# Patient Record
Sex: Female | Born: 1975 | Race: White | Hispanic: No | Smoking: Former smoker
Health system: Southern US, Community
[De-identification: ages and names within clinical notes are randomized; demographics above are authoritative.]

## PROBLEM LIST (undated history)

## (undated) ENCOUNTER — Emergency Department: Payer: 59 | Source: Home / Self Care

## (undated) ENCOUNTER — Inpatient Hospital Stay (HOSPITAL_COMMUNITY): Payer: Self-pay

## (undated) DIAGNOSIS — M1990 Unspecified osteoarthritis, unspecified site: Secondary | ICD-10-CM

## (undated) DIAGNOSIS — Z8659 Personal history of other mental and behavioral disorders: Secondary | ICD-10-CM

## (undated) DIAGNOSIS — F1910 Other psychoactive substance abuse, uncomplicated: Secondary | ICD-10-CM

## (undated) DIAGNOSIS — O09529 Supervision of elderly multigravida, unspecified trimester: Secondary | ICD-10-CM

## (undated) DIAGNOSIS — F329 Major depressive disorder, single episode, unspecified: Secondary | ICD-10-CM

## (undated) DIAGNOSIS — G8929 Other chronic pain: Secondary | ICD-10-CM

## (undated) DIAGNOSIS — M549 Dorsalgia, unspecified: Secondary | ICD-10-CM

## (undated) DIAGNOSIS — R200 Anesthesia of skin: Secondary | ICD-10-CM

## (undated) DIAGNOSIS — F32A Depression, unspecified: Secondary | ICD-10-CM

## (undated) HISTORY — DX: Supervision of elderly multigravida, unspecified trimester: O09.529

## (undated) HISTORY — DX: Personal history of other mental and behavioral disorders: Z86.59

## (undated) HISTORY — PX: LUMBAR FUSION: SHX111

## (undated) HISTORY — PX: BUNIONECTOMY: SHX129

## (undated) HISTORY — PX: OTHER SURGICAL HISTORY: SHX169

## (undated) HISTORY — PX: BREAST SURGERY: SHX581

## (undated) HISTORY — PX: CHOLECYSTECTOMY: SHX55

## (undated) HISTORY — PX: BACK SURGERY: SHX140

## (undated) HISTORY — PX: DIAGNOSTIC LAPAROSCOPY: SUR761

---

## 1997-05-05 ENCOUNTER — Other Ambulatory Visit: Admission: RE | Admit: 1997-05-05 | Discharge: 1997-05-05 | Payer: Self-pay | Admitting: Obstetrics and Gynecology

## 1998-06-22 ENCOUNTER — Other Ambulatory Visit: Admission: RE | Admit: 1998-06-22 | Discharge: 1998-06-22 | Payer: Self-pay | Admitting: Obstetrics and Gynecology

## 1999-06-23 ENCOUNTER — Other Ambulatory Visit: Admission: RE | Admit: 1999-06-23 | Discharge: 1999-06-23 | Payer: Self-pay | Admitting: Obstetrics and Gynecology

## 2000-10-17 ENCOUNTER — Other Ambulatory Visit: Admission: RE | Admit: 2000-10-17 | Discharge: 2000-10-17 | Payer: Self-pay | Admitting: Obstetrics and Gynecology

## 2002-01-10 ENCOUNTER — Other Ambulatory Visit: Admission: RE | Admit: 2002-01-10 | Discharge: 2002-01-10 | Payer: Self-pay | Admitting: Obstetrics and Gynecology

## 2003-03-09 ENCOUNTER — Other Ambulatory Visit: Admission: RE | Admit: 2003-03-09 | Discharge: 2003-03-09 | Payer: Self-pay | Admitting: *Deleted

## 2004-04-13 ENCOUNTER — Other Ambulatory Visit: Admission: RE | Admit: 2004-04-13 | Discharge: 2004-04-13 | Payer: Self-pay | Admitting: Obstetrics and Gynecology

## 2004-08-23 ENCOUNTER — Ambulatory Visit: Payer: Self-pay | Admitting: Psychiatry

## 2004-08-23 ENCOUNTER — Other Ambulatory Visit: Admission: RE | Admit: 2004-08-23 | Discharge: 2004-09-15 | Payer: Self-pay | Admitting: Psychiatry

## 2004-09-21 ENCOUNTER — Ambulatory Visit (HOSPITAL_COMMUNITY): Payer: Self-pay | Admitting: Psychiatry

## 2004-12-17 ENCOUNTER — Ambulatory Visit (HOSPITAL_COMMUNITY): Admission: RE | Admit: 2004-12-17 | Discharge: 2004-12-17 | Payer: Self-pay | Admitting: Family Medicine

## 2005-03-18 ENCOUNTER — Emergency Department (HOSPITAL_COMMUNITY): Admission: EM | Admit: 2005-03-18 | Discharge: 2005-03-18 | Payer: Self-pay | Admitting: Emergency Medicine

## 2006-08-05 ENCOUNTER — Inpatient Hospital Stay (HOSPITAL_COMMUNITY): Admission: AD | Admit: 2006-08-05 | Discharge: 2006-08-05 | Payer: Self-pay | Admitting: Obstetrics and Gynecology

## 2006-08-24 ENCOUNTER — Inpatient Hospital Stay (HOSPITAL_COMMUNITY): Admission: AD | Admit: 2006-08-24 | Discharge: 2006-08-27 | Payer: Self-pay | Admitting: Obstetrics and Gynecology

## 2006-08-25 ENCOUNTER — Encounter (INDEPENDENT_AMBULATORY_CARE_PROVIDER_SITE_OTHER): Payer: Self-pay | Admitting: Obstetrics and Gynecology

## 2007-05-20 ENCOUNTER — Inpatient Hospital Stay (HOSPITAL_COMMUNITY): Admission: EM | Admit: 2007-05-20 | Discharge: 2007-05-22 | Payer: Self-pay | Admitting: Emergency Medicine

## 2007-05-20 ENCOUNTER — Encounter (INDEPENDENT_AMBULATORY_CARE_PROVIDER_SITE_OTHER): Payer: Self-pay | Admitting: Surgery

## 2007-11-01 ENCOUNTER — Ambulatory Visit (HOSPITAL_COMMUNITY): Admission: RE | Admit: 2007-11-01 | Discharge: 2007-11-01 | Payer: Self-pay | Admitting: Obstetrics and Gynecology

## 2010-06-21 NOTE — Discharge Summary (Signed)
Laura, Whitehead              ACCOUNT NO.:  [id number]   MEDICAL RECORD NO.:  [id number]          PATIENT TYPE:  INP   LOCATION:  1615                         FACILITY:  [facility]   PHYSICIAN:  Sandria Bales. Ezzard Standing, M.D.  DATE OF BIRTH:  1976/02/07   DATE OF ADMISSION:  05/20/2007  DATE OF DISCHARGE:  05/22/2007                               DISCHARGE SUMMARY   Where are admit dates and discharge dates ???   DISCHARGE DIAGNOSES:  1. Cholecystitis with cholelithiasis (final pathology pending).  2. Right flank pain presumably secondary to gallbladder disease.   PROCEDURE:  Laparoscopic cholecystectomy with intraoperative  cholangiogram on May 20, 2007.   HISTORY OF PRESENT ILLNESS:  [name] is a 35 year old, white female,  patient of Dr. Sigmund Hazel, who presented on May 20, 2007, with 3-day  history of right-sided abdominal pain.  She had an ultrasound and CT  scan at [facility] which showed a thickened gallbladder wall  with gallstones and questionable common bile duct stone.  She has a  strong family history of gallbladder disease.   PAST MEDICAL HISTORY:  Recent oral surgery.  She had been on and off  amoxicillin for about 2 weeks.  She quit smoking about 1 year before.   PHYSICAL EXAMINATION:  VITAL SIGNS:  Temperature is 98.7, pulse 98,  blood pressure 131/100.  GENERAL:  She presented with right upper quadrant abdominal pain.   LABORATORY DATA AND X-RAY FINDINGS:  She had a lipase of 27 on  admission.  She had a white blood count of 21,500, hemoglobin 14.6.  Her  liver function showed an Alk phos of 68 and a bilirubin of 0.8 and  otherwise normal liver functions.   HOSPITAL COURSE:  She was taken to the operating room on the day of  admission where she underwent a laparoscopic cholecystectomy with  intraoperative cholangiogram.  On the cholangiogram, she had a mildly  dilated common bile, but there is no evidence of any common bile duct  stone.   Postoperatively, she did well from the surgery, but on postop day #1,  she was still experiencing a fair amount of flank and right upper  quadrant flank and back pain that seemed a little bit out of proportion  to her gallbladder surgery.  I did give her some Toradol.  We kept her  for another day.  She is now 2 days postop.  She is still sore in the  right upper quadrant, but feeling much better.  Rechecked some labs on  her and white blood count was 11,800.  Her amylase was 51.  Her total  bilirubin was 0.8 and Alk phos 68.   She is now ready for discharge.   DIET:  Low fat diet.   ACTIVITY:  She can shower.  She should not drive Z6-1 days.  She is  clearly comfortable from her surgery.   DISCHARGE MEDICATIONS:  She is given Vicodin for pain and she can also  use ibuprofen.  I am not going to send her home on any antibiotics.   FOLLOW UP:  I do want to see  her back in 2 weeks for followup.  Again,  pathology is pending at the time of this dictation.      Sandria Bales. Ezzard Standing, M.D.  Electronically Signed     DHN/MEDQ  D:  05/22/2007  T:  05/22/2007  Job:  [id number]   cc:   Sigmund Hazel, M.D.  Fax: [phone]

## 2010-06-21 NOTE — Op Note (Signed)
[name], [name]              ACCOUNT NO.:  [id number]   MEDICAL RECORD NO.:  [id number]          PATIENT TYPE:  INP   LOCATION:  0098                         FACILITY:  [facility]   PHYSICIAN:  Sandria Bales. Ezzard Standing, M.D.  DATE OF BIRTH:  [birth year]   DATE OF PROCEDURE:  05/20/2007  DATE OF DISCHARGE:                               OPERATIVE REPORT   Date of surgery???   PREOPERATIVE DIAGNOSES:  Cholecystitis and cholelithiasis.   POSTOPERATIVE DIAGNOSES:  Cholecystitis and cholelithiasis.   PROCEDURE:  Laparoscopic cholecystectomy with intraoperative  cholangiogram.   SURGEON:  Sandria Bales. Ezzard Standing, M.D.   FIRST ASSISTANT:  Maisie Fus A. Cornett, M.D.   ANESTHESIA:  General endotracheal.   BLOOD LOSS:  Minimal.   INDICATION FOR PROCEDURE:  [name] is a 35 year old white female, a  patient of Dr. Sigmund Hazel, who has a 3-day history of right-sided  abdominal pain.  She had an ultrasound and CT scan at [facility] today, which showed a thickened gallbladder wall and  gallstones.  They also raised a question of common bile duct stones.  She also has an elevated white blood count.   I discussed with her about proceeding with a cholecystectomy.  I  discussed with her the indications and potential risks.  The potential  risks include, but are not limited to, bleeding, infection, common duct  stones, common bile duct injury and the possibility of open surgery.   OPERATIVE NOTE:  The patient was placed in a supine position, given a  general endotracheal anesthetic.  She had a gram of Ancef  preoperatively.  Her abdomen was prepped with Betadine solution and  sterilely draped.  A time-out was held, identifying the patient and the  procedure.   I placed 4 trocars; a 12-mm Hasson at the umbilicus, a 10/11 mm trocar  in the subxiphoid location, a 5 mm trocar in the right mid subcostal,  and a 5 mm trocar in the right lateral subcostal location.  Abdominal  exploration revealed  right and left lobes of the liver unremarkable.  Stomach was unremarkable; I visualized the appendix, which was  unremarkable and not inflamed.  The gallbladder was somewhat boggy,  mildly edematous; but not as bad looking as I would imagine by the way  the CT scan looked.  I grabbed the gallbladder and rotated it cephalad.  There was some edema around the gallbladder cystic duct neck.  I  Identified the triangle of Calot.  I identified the cystic duct-  gallbladder junction, and placed a clip on gallbladder side and shot an  intraoperative cholangiogram.   The intraoperative cholangiogram was shot using cutoff taut catheter  through a 14-gauge Jelco.  The 14-gauge Jelco was secured with the Endo  clip into the side of the cut cystic duct.  I used about 8 mL of half-  strength Hypaque solution with contrast; which flowed down the cystic  duct, into the common bile duct and into the duodenum at the hepatic  radicals.  The common bile duct was mildly dilated.  There was no  filling defect.  Contrast flowed promptly into  the small bowel.  There  was no evidence of retained stone.  It was felt to be a normal  intraoperative cholangiogram.   The taut catheter was then removed.  The cystic duct was triply Endo  clipped and divided.  The cystic artery, anterior and posterior branch  were clipped.  There was actually one very large branch, which was made  more anterior -- which was clipped and a smaller posterior branch.  This  was doubly clipped.  The gallbladder was sharply and bluntly dissected  from the gallbladder bed.  Prior to division of the gallbladder from  gallbladder bed. I revisualized the triangle of Calot.  I revisualized  the gallbladder bed; there was no bleeding and no bile leak.  The  gallbladder was then divided, and placed in an EndoCatch bag and  delivered through the umbilicus.   I then closed the umbilical port with a 0 Vicryl suture.  I visualized  the trocar and  removed them at each site.  There was no bleeding at a  trocar site.  The skin at each trocar site was closed with a 5-0 Vicryl  suture.  I infiltrated about 35 mL of 0.25% Marcaine as a local  anesthetic.  I painted each wound with Tincture of Benzoin and Steri-  Strips.   The patient tolerated the procedure well.  She was transported to the  recovery room in good condition.  Sponge and needle counts were correct  at the end of the case.      Sandria Bales. Ezzard Standing, M.D.  Electronically Signed     DHN/MEDQ  D:  05/20/2007  T:  05/20/2007  Job:  [id number]   cc:   Sigmund Hazel, M.D.  Fax: [phone]

## 2010-06-21 NOTE — Op Note (Signed)
[name], [name]              ACCOUNT NO.:  [id number]   MEDICAL RECORD NO.:  [id number]          PATIENT TYPE:  AMB   LOCATION:  SDC                           FACILITY:  [facility]   PHYSICIAN:  Duke Salvia. Marcelle Overlie, M.D.DATE OF BIRTH:  12-14-1975   DATE OF PROCEDURE:  11/01/2007  DATE OF DISCHARGE:                               OPERATIVE REPORT   PREOPERATIVE DIAGNOSIS:  Dyspareunia.   POSTOPERATIVE DIAGNOSIS:  Dyspareunia.   PROCEDURE:  Diagnostic laparoscopy.   SURGEON:  Duke Salvia. Marcelle Overlie, MD   ANESTHESIA:  General endotracheal.   COMPLICATIONS:  None.   DRAINS:  In-and-out Foley catheter.   BLOOD LOSS:  Minimal.   SPECIMENS:  None.   PROCEDURE AND FINDINGS:  The patient was taken to the operating room.  After an adequate level of general endotracheal anesthesia was obtained,  the patient legs in stirrups, the abdomen, perineum, and vagina were  prepped and draped in usual manner for laparoscopy.  The bladder was  drained, EUA carried out.  Uterus normal size and mobile.  Adnexa  negative.  Hulka tenaculum was positioned.  Subumbilical area was  infiltrated with 0.25% Marcaine plain.  A small incision was made.  The  Veress needle introduced without difficulty.  Its intra-abdominal  position was verified by pressure and water testing.  After 2-1/2 liter  pneumoperitoneum was then created, laparoscopic trocar and sleeve were  then introduced without difficulty.  There was no evidence of any  bleeding or trauma.  Three fingerbreadths above the symphysis in  midline, a 5-mm trocar was inserted under direct visualization.  The  patient then placed in Trendelenburg and the pelvic finding as follows.   The uterus itself was normal size, mobile.  Serosa unremarkable.  The  area of the bladder flap was normal.  No evidence of endometriosis.  Adnexa bilaterally unremarkable.  Delicate fimbriated end.  There were  no periadnexal adhesions and no surface endometriosis on the  ovaries.  Cul-de-sac was free and clear.  Careful inspection and magnification of  the uterosacral ligaments could not identify any endometriosis.  Course  of the ureter on either side  was normal.  Appendix appeared to be normal as did liver edge and upper  abdomen.  After these findings were photo documented, the instruments  were removed, gas allowed to escape, defects were closed with 4-0 Dexon,  subcuticular sutures and Dermabond.  She tolerated this well and went to  recovery room in good condition.      Richard M. Marcelle Overlie, M.D.  Electronically Signed     RMH/MEDQ  D:  11/01/2007  T:  11/01/2007  Job:  [id number]

## 2010-06-21 NOTE — H&P (Signed)
NAMEQUENTINA, FRONEK              ACCOUNT NO.:  [id number]   MEDICAL RECORD NO.:  [id number]          PATIENT TYPE:  AMB   LOCATION:                                FACILITY:  [facility]   PHYSICIAN:  Duke Salvia. Marcelle Overlie, M.D.DATE OF BIRTH:  1975-10-12   DATE OF ADMISSION:  11/01/2007  DATE OF DISCHARGE:                              HISTORY & PHYSICAL   CHIEF COMPLAINT:  Pelvic pain/dyspareunia.   HISTORY OF PRESENT ILLNESS:  A 35 year old G2, P1 currently on Depo-  Provera has a 6 to 12 month history of worsening pelvic pain and deep  dyspareunia.  She presents now for diagnostic laparoscopy to further  evaluate.  This procedure including risks related to bleeding,  infection, adjacent organ injury, the possible need for open additional  surgery all reviewed with her which she understands and accepts.  Laparoscopic procedures for lysis of adhesions and treatment of  endometriosis reviewed also.   PAST MEDICAL HISTORY:  Allergies, none.   CURRENT MEDICATIONS:  Depo-Provera.   SURGERY:  Had vaginal delivery July 2008, cholecystectomy April 2009.   FAMILY HISTORY:  Significant for headache otherwise unremarkable.   PHYSICAL EXAM:  VITAL SIGNS:  Temperature 98.2, blood pressure 120/62.  HEENT: Unremarkable.  NECK:  Supple without masses.  LUNGS:  Clear.  CARDIOVASCULAR:  Rate and rhythm without murmurs, rubs or gallops.  BREASTS:  Without masses.  ABDOMEN:  Soft, flat, and nontender.  PELVIC:  Normal external genitalia.  Vagina and cervix clear.  Uterus  mid position, normal size.  Adnexa negative.  No unusual nodularity or  tenderness.  EXTREMITIES:  Unremarkable.  NEUROLOGIC:  Unremarkable.   IMPRESSION:  Deep dyspareunia/pelvic pain.   PLAN:  Diagnostic laparoscopy.  Procedure and risks reviewed as above.      Richard M. Marcelle Overlie, M.D.  Electronically Signed     RMH/MEDQ  D:  10/31/2007  T:  10/31/2007  Job:  [id number]

## 2010-06-21 NOTE — H&P (Signed)
[name], [name]              ACCOUNT NO.:  [id number]   MEDICAL RECORD NO.:  [id number]          PATIENT TYPE:  EMS   LOCATION:  ED                           [facility]ACILITY:  [facility]   PHYSICIAN:  Sandria Bales. Ezzard Standing, M.D.  DATE OF BIRTH:  Jun 07, 1975   DATE OF ADMISSION:  05/20/2007  DATE OF DISCHARGE:                              HISTORY & PHYSICAL   CHIEF COMPLAINT:  Possible gallbladder disease.   REFERRING PHYSICIAN:  Sigmund Hazel, M.D.   HISTORY OF ILLNESS:  This is a 35 year old white female patient of Dr.  Sigmund Hazel who was in otherwise good health until approximately Friday,  May 17, 1998, when she started developing right upper quadrant pain.  She kind of suffered through the weekend.  She saw Dr. Hyacinth Meeker today.  Dr. Hyacinth Meeker obtained a CT scan and ultrasound at [facility] radiology.  The CT scan was done without contrast.  It that showed a gallbladder  wall thickening with tiny gallstones.  An ultrasound showed gallbladder  wall thickening with gallstones and question choledocholithiasis and a  normal-sized common bile duct.   Ms. Geers has multiple family members who have had gallbladder disease.  She denies any history of peptic ulcer disease, liver disease,  hepatitis, pancreatic disease, colon disease.  She has had no prior  abdominal surgery.   PAST MEDICAL HISTORY:   ALLERGIES:  She has no allergies.   MEDICATIONS:  She is on no medication.   REVIEW OF SYSTEMS:  NEUROLOGIC:  No seizure or loss of consciousness.  PULMONARY:  She quit smoking last year.  No lung disease or asthma.  GASTROINTESTINAL:  No history of peptic ulcer disease or liver disease.  See history of present illness.  NEUROLOGIC:  She  had a kidney infection as a child but no problems as  an adult.  GYN:  Her first birth was last summer, and her husband is in the room  with her.   She worked at [employer], however was laid off just 2 weeks ago.   PHYSICAL EXAMINATION:  VITAL SIGNS:  Her temperature  is 98.7, blood  pressure 130/100, pulse is 98.  Her saturations are 99%.  GENERAL:  She is a well-nourished white female, alert and cooperative.  HEENT:  Unremarkable.  NECK:  Supple.  I feel no mass, no thyromegaly.  She has no cervical or  supraclavicular adenopathy.  LUNGS:  Clear to auscultation with symmetric breath sounds.  HEART:  Regular rate and rhythm without murmur or rub.  ABDOMEN:  She has not really tenderness, but some soreness in her right  abdomen, kind of the whole right side.  She really did not have any  guarding.  She has no rebound.  Her bowel sounds are present.  EXTREMITIES:  She has good strength in all 4 extremities.  NEUROLOGIC:  Grossly intact.   LABORATORY DATA:  Labs that I have provided by Dr. Hyacinth Meeker include normal  liver functions with a total bilirubin of 0.7 and alkaline phosphatase  of 71.  Her urinalysis was negative.  Her hemoglobin was 15, hematocrit  42.  Her white blood count  was 16,100.  I have repeated the labs here.   X-RAYS:  I reviewed her films from [facility] with Dr. Ezequiel Ganser and he agrees about gallbladder disease.  Again, she has had  several family members who have had gallbladder surgery, so she is  somewhat familiar with it.   IMPRESSION:  1. Cholecystitis and cholelithiasis:  I talked about laparoscopic and      open gallbladder surgery.  I talked about the risks which include      bleeding, infection, common bile duct stones and common bile duct      injury, and that she may need some further tests such as an ERCP if      she has common bile duct stones and I cannot clear her ducts.      Ialso talked about how her symptoms could be from another cause,      but the gall bladder seems the most likely source of pain.      Sandria Bales. Ezzard Standing, M.D.  Electronically Signed     DHN/MEDQ  D:  05/20/2007  T:  05/20/2007  Job:  [id number]   cc:   Sigmund Hazel, M.D.  Fax: [phone]   Hilario Quarry, M.D.  Fax:  [phone]

## 2010-06-21 NOTE — H&P (Signed)
NAMEABBY, TUCHOLSKI              ACCOUNT NO.:  [id number]   MEDICAL RECORD NO.:  [id number]          PATIENT TYPE:  AMB   LOCATION:  SDC                           FACILITY:  [facility]   PHYSICIAN:  Duke Salvia. Marcelle Overlie, M.D.DATE OF BIRTH:  1975/12/19   DATE OF ADMISSION:  DATE OF DISCHARGE:                              HISTORY & PHYSICAL   Date of scheduled surgery at [facility] is on November 01, 2007.   CHIEF COMPLAINT:  Pelvic pain and dyspareunia.   HISTORY OF PRESENT ILLNESS:  A 35 year old G2, P1, A1 on Depo-Provera  has a 6-12 months' history of worsening pain, deep pain with intercourse  and also pelvic pain at other times.  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  She has been taking higher dose of Motrin 800 mg q.8-12 h for  pain without significant improvement, and presents now for diagnostic  laparoscopy.  This procedure including risks related to bleeding,  infection, adjacent organ injury, and the possible need for open  additional surgery were all reviewed.  Additionally, laparoscopic  treatment, lysis of adhesions or treatment of endometriosis was  explained to her.   PAST MEDICAL HISTORY:   ALLERGIES:  None.   OPERATIONS:  Vaginal delivery and cholecystectomy.   REVIEW OF SYSTEMS:  Significant for a history of substance abuse.  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   MEDICATIONS:  She is taking Depo-Provera now and ibuprofen p.r.n. pain.   SOCIAL HISTORY:  She has quit smoking.  Denies alcohol use.   FAMILY HISTORY:  Significant for heart disease and arthritis.   PHYSICAL EXAMINATION:  VITAL SIGNS:  Temperature 98.2 and blood pressure  120/78.  HEENT:  Unremarkable.  NECK:  Supple without masses.  LUNGS:  Clear.  CARDIOVASCULAR:  Regular rate and rhythm without murmurs, rubs, or  gallops.  BREASTS:  Without masses.  ABDOMEN:  Soft and nontender.  PELVIC:  Normal external genitalia.  Vagina and cervix are clear.  Uterus mid-positional size.  Adnexa negative.  No unusual nodularity or  tenderness.  EXTREMITIES:  Unremarkable.  NEUROLOGIC:  Unremarkable.   IMPRESSION:  1. Dyspareunia.  2. Chronic pelvic pain.   PLAN:  Diagnostic laparoscopy.  Procedure and risks reviewed as above.      Richard M. Marcelle Overlie, M.D.  Electronically Signed     RMH/MEDQ  D:  10/23/2007  T:  10/23/2007  Job:  [id number]

## 2010-11-01 LAB — COMPREHENSIVE METABOLIC PANEL
ALT: 16
AST: 16
Albumin: 4.1
Alkaline Phosphatase: 68
BUN: 7
CO2: 24
Calcium: 9.5
Chloride: 109
Creatinine, Ser: 0.69
GFR calc Af Amer: >60
GFR calc non Af Amer: >60
Glucose, Bld: 99
Potassium: 3.7
Sodium: 140
Total Bilirubin: 0.8
Total Protein: 7.4

## 2010-11-01 LAB — CBC
HCT: 35.9 — ABNORMAL LOW
HCT: 43.2
Hemoglobin: 12.3
Hemoglobin: 14.6
MCHC: 33.8
MCHC: 34.3
MCV: 88.8
MCV: 89.5
Platelets: 232
Platelets: 308
RBC: 4.04
RBC: 4.83
RDW: 12.8
RDW: 13.2
WBC: 11.8 — ABNORMAL HIGH
WBC: 21.5 — ABNORMAL HIGH

## 2010-11-01 LAB — DIFFERENTIAL
Basophils Absolute: 0.2 — ABNORMAL HIGH
Basophils Relative: 1
Eosinophils Absolute: 0.2
Eosinophils Relative: 1
Lymphocytes Relative: 18
Lymphs Abs: 3.9
Monocytes Absolute: 0.8
Monocytes Relative: 4
Neutro Abs: 16.4 — ABNORMAL HIGH
Neutrophils Relative %: 76

## 2010-11-01 LAB — BASIC METABOLIC PANEL
BUN: 5 — ABNORMAL LOW
CO2: 26
Calcium: 8.9
Chloride: 108
Creatinine, Ser: 0.65
GFR calc Af Amer: >60
GFR calc non Af Amer: >60
Glucose, Bld: 106 — ABNORMAL HIGH
Potassium: 3.6
Sodium: 141

## 2010-11-01 LAB — AMYLASE: Amylase: 51

## 2010-11-01 LAB — LIPASE, BLOOD: Lipase: 27

## 2010-11-07 LAB — PREGNANCY, URINE: Preg Test, Ur: NEGATIVE

## 2010-11-07 LAB — CBC
HCT: 40.7
Hemoglobin: 13.8
MCHC: 33.9
MCV: 89.3
Platelets: 304
RBC: 4.56
RDW: 14.1
WBC: 12 — ABNORMAL HIGH

## 2010-11-21 LAB — CBC
HCT: 29 — ABNORMAL LOW
HCT: 39.1
Hemoglobin: 10 — ABNORMAL LOW
Hemoglobin: 13.5
MCHC: 34.5
MCHC: 34.6
MCV: 88.7
MCV: 89.7
Platelets: 243
Platelets: 267
RBC: 3.23 — ABNORMAL LOW
RBC: 4.41
RDW: 13.6
RDW: 13.7
WBC: 19.2 — ABNORMAL HIGH
WBC: 26.1 — ABNORMAL HIGH

## 2010-11-21 LAB — RPR: RPR Ser Ql: NONREACTIVE

## 2011-04-07 ENCOUNTER — Emergency Department (HOSPITAL_COMMUNITY)
Admission: EM | Admit: 2011-04-07 | Discharge: 2011-04-08 | Disposition: A | Discharge status: Home / Self Care | Payer: BC Managed Care – PPO | Attending: Emergency Medicine | Admitting: Emergency Medicine

## 2011-04-07 DIAGNOSIS — F1910 Other psychoactive substance abuse, uncomplicated: Secondary | ICD-10-CM | POA: Insufficient documentation

## 2011-04-07 DIAGNOSIS — IMO0002 Reserved for concepts with insufficient information to code with codable children: Secondary | ICD-10-CM | POA: Insufficient documentation

## 2011-04-07 DIAGNOSIS — F29 Unspecified psychosis not due to a substance or known physiological condition: Secondary | ICD-10-CM

## 2011-04-07 HISTORY — DX: Other psychoactive substance abuse, uncomplicated: F19.10

## 2011-04-07 NOTE — ED Notes (Signed)
Pt brought in by EMS with c/o hallucinations  Pt thinks there are bugs falling off the ceiling landing on her and that they are crawling under her skin  Pt is crying in room and husband is at bedside  Pt has an addiction and has been taking suboxone  Pt states she has taken one vicodin today and a half of the suboxone today  Pt was uncooperative with EMS and refused any treatment from them

## 2011-04-07 NOTE — ED Notes (Signed)
[id number]<YN> Expected date:<BR> Expected time:11:46 PM<BR> Means of arrival:<BR> Comments:<BR> M10 - 34yoF hallucinating

## 2011-04-08 ENCOUNTER — Encounter (HOSPITAL_COMMUNITY): Payer: Self-pay | Admitting: Emergency Medicine

## 2011-04-08 LAB — CBC
HCT: 36.9 % (ref 36.0–46.0)
Hemoglobin: 12.8 g/dL (ref 12.0–15.0)
MCH: 29.3 pg (ref 26.0–34.0)
MCHC: 34.7 g/dL (ref 30.0–36.0)
MCV: 84.4 fL (ref 78.0–100.0)
Platelets: 345 10*3/uL (ref 150–400)
RBC: 4.37 MIL/uL (ref 3.87–5.11)
RDW: 13.4 % (ref 11.5–15.5)
WBC: 18.7 10*3/uL — ABNORMAL HIGH (ref 4.0–10.5)

## 2011-04-08 LAB — BASIC METABOLIC PANEL
BUN: 8 mg/dL (ref 6–23)
CO2: 21 mEq/L (ref 19–32)
Calcium: 9.3 mg/dL (ref 8.4–10.5)
Chloride: 102 mEq/L (ref 96–112)
GFR calc Af Amer: >90 mL/min (ref >90)
GFR calc non Af Amer: >90 mL/min (ref >90)
Glucose, Bld: 127 mg/dL — ABNORMAL HIGH (ref 70–99)
Potassium: 2.9 mEq/L — ABNORMAL LOW (ref 3.5–5.1)
Sodium: 134 mEq/L — ABNORMAL LOW (ref 135–145)

## 2011-04-08 LAB — PREGNANCY, URINE: Preg Test, Ur: NEGATIVE

## 2011-04-08 LAB — DIFFERENTIAL
Basophils Absolute: 0.1 10*3/uL (ref 0.0–0.1)
Basophils Relative: 0 % (ref 0–1)
Eosinophils Relative: 1 % (ref 0–5)
Lymphocytes Relative: 18 % (ref 12–46)
Lymphs Abs: 3.3 10*3/uL (ref 0.7–4.0)
Monocytes Absolute: 1.2 10*3/uL — ABNORMAL HIGH (ref 0.1–1.0)
Monocytes Relative: 7 % (ref 3–12)
Neutro Abs: 14 10*3/uL — ABNORMAL HIGH (ref 1.7–7.7)

## 2011-04-08 LAB — RAPID URINE DRUG SCREEN, HOSP PERFORMED
Amphetamines: POSITIVE — AB
Barbiturates: NOT DETECTED
Benzodiazepines: NOT DETECTED
Cocaine: NOT DETECTED
Opiates: POSITIVE — AB
Tetrahydrocannabinol: NOT DETECTED

## 2011-04-08 LAB — ETHANOL: Alcohol, Ethyl (B): <11 mg/dL (ref 0–11)

## 2011-04-08 MED ORDER — LORAZEPAM 1 MG PO TABS
1.0000 mg | ORAL_TABLET | Freq: Once | ORAL | Status: AC
  Administered 2011-04-08: 1 mg via ORAL
  Filled 2011-04-08: qty 1

## 2011-04-08 MED ORDER — ZOLPIDEM TARTRATE 5 MG PO TABS: 5.0000 mg | ORAL_TABLET | Freq: Every evening | ORAL | Status: DC | PRN

## 2011-04-08 MED ORDER — IBUPROFEN 600 MG PO TABS: 600.0000 mg | ORAL_TABLET | Freq: Three times a day (TID) | ORAL | Status: DC | PRN

## 2011-04-08 MED ORDER — ACETAMINOPHEN 325 MG PO TABS: 650.0000 mg | ORAL_TABLET | ORAL | Status: DC | PRN

## 2011-04-08 MED ORDER — LORAZEPAM 1 MG PO TABS: 1.0000 mg | ORAL_TABLET | Freq: Three times a day (TID) | ORAL | Status: DC | PRN

## 2011-04-08 MED ORDER — NICOTINE 21 MG/24HR TD PT24: 21.0000 mg | MEDICATED_PATCH | Freq: Every day | TRANSDERMAL | Status: DC

## 2011-04-08 MED ORDER — ONDANSETRON HCL 4 MG PO TABS: 4.0000 mg | ORAL_TABLET | Freq: Three times a day (TID) | ORAL | Status: DC | PRN

## 2011-04-08 MED ORDER — ZIPRASIDONE MESYLATE 20 MG IM SOLR
10.0000 mg | Freq: Once | INTRAMUSCULAR | Status: DC
  Filled 2011-04-08: qty 20

## 2011-04-08 MED ORDER — POTASSIUM CHLORIDE CRYS ER 20 MEQ PO TBCR
40.0000 meq | EXTENDED_RELEASE_TABLET | Freq: Once | ORAL | Status: AC
  Administered 2011-04-08: 40 meq via ORAL
  Filled 2011-04-08: qty 2

## 2011-04-08 NOTE — ED Notes (Signed)
Pt reports that she has "red beetle bugs" at her home, that she and her husband were staying at a hotel while the house was "sprayed for bugs" pt reports that last night she saw "bed bugs" in the hotel and "had to change rooms" " I sqwished them" pt reports that she got into an argument with her husband this evening reports "he didn't want to hear about the bugs" pt reports that she "looks for bugs because she doesn't like them"  Pt sitting with her knees to chest and tearful while speaking to this recorder pt reports that she feels sad because she couldn't talk to her dad that her husband talked to him but he wouldn't let her talk to him. Pt unable to sit still while speaking with this recorder pt denied SI/HI denies seeing any bugs at this time denies AH.  Pt oriented to the room and pt verbalized understanding of unit rules. pt is awaiting to speak with [facility]. Will continue to monitor for safety.

## 2011-04-08 NOTE — ED Notes (Signed)
telepsych in progress 

## 2011-04-08 NOTE — ED Notes (Signed)
Written dc instructions reviewed w/ pt. Pt verbalized understanding.  Pt's sister is picking her up and will bring her clothes to change into.

## 2011-04-08 NOTE — ED Notes (Signed)
Pt's husband here w/ belongings.  Pt became upset when he arrived, crying when she was talking w/ him.  Pt reports she is not going to go home w/ him, but is going to go to stay w/ her sister.

## 2011-04-08 NOTE — BHH Counselor (Signed)
Pt being d/c home by Tele Psych and will follow up with PCP and related providers

## 2011-04-08 NOTE — ED Provider Notes (Signed)
History     CSN: [id number]  Arrival date & time 04/07/11  2355   None     Chief Complaint  Patient presents with  . Medical Clearance    (Consider location/radiation/quality/duration/timing/severity/associated sxs/prior treatment) HPI  Patient's husband states that she has been seeing bugs everywhere. Patient states that she has been taking Suboxone for 2 months. She did do math within the past 2-3 days. Friend. She has a history of narcotics abuse but states that she has not been taking this. She denies alcohol or other drugs she has not been in treatment for this before. She denies any other physical problems.  Past Medical History  Diagnosis Date  . Substance abuse     History reviewed. No pertinent past surgical history.  History reviewed. No pertinent family history.  History  Substance Use Topics  . Smoking status: Not on file  . Smokeless tobacco: Not on file  . Alcohol Use: No    OB History    Grav Para Term Preterm Abortions TAB SAB Ect Mult Living                  Review of Systems  All other systems reviewed and are negative.    Allergies  Review of patient's allergies indicates no known allergies.  Home Medications   Current Outpatient Rx  Name Route Sig Dispense Refill  . BUPRENORPHINE HCL-NALOXONE HCL 8-2 MG SL SUBL Sublingual Place 0.25 tablets under the tongue daily.       BP 129/90  Pulse 93  Temp(Src) 98.1 F (36.7 C) (Oral)  Resp 16  SpO2 100%  Physical Exam  Vitals reviewed. Constitutional: She is oriented to person, place, and time. She appears well-developed and well-nourished.  HENT:  Head: Normocephalic and atraumatic.  Right Ear: External ear normal.  Left Ear: External ear normal.  Nose: Nose normal.  Mouth/Throat: Oropharynx is clear and moist.  Eyes: Conjunctivae and EOM are normal. Pupils are equal, round, and reactive to light.  Neck: Normal range of motion. Neck supple.  Cardiovascular: Normal rate and regular  rhythm.   Pulmonary/Chest: Effort normal and breath sounds normal.  Abdominal: Soft. Bowel sounds are normal.  Neurological: She is alert and oriented to person, place, and time.  Skin: Skin is warm and dry.  Psychiatric:       Patient is irritable and agitated moving about the room.    ED Course  Procedures (including critical care time)  Labs Reviewed  CBC - Abnormal; Notable for the following:    WBC 18.7 (*)    All other components within normal limits  DIFFERENTIAL - Abnormal; Notable for the following:    Neutro Abs 14.0 (*)    Monocytes Absolute 1.2 (*)    All other components within normal limits  BASIC METABOLIC PANEL  URINE RAPID DRUG SCREEN (HOSP PERFORMED)  ETHANOL  PREGNANCY, URINE   No results found.   No diagnosis found.    MDM   Patient with hallucinations consistent with methamphetamine abuse. Patient will be held in psych ED for active evaluation.      Hilario Quarry, MD 04/09/11 [phone]

## 2011-04-08 NOTE — ED Notes (Signed)
Up to the desk on the phone 
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
## 2011-04-08 NOTE — ED Provider Notes (Signed)
10:20 AM Patient has completed TelePsych evaluation.  She has been psychiatrically cleared for d/c with outpatient follow-up.  She will be provided appropriate resources and discharged.  Gerhard Munch, MD 04/08/11 1021

## 2011-04-08 NOTE — ED Notes (Addendum)
On dc, pt calm, smiling, reports that she is going to stay w/ her sister.  Pt encouraged to return/seek help if she feels that she is in danger, has any SI/HI thoughts or urges, or difficulties  Pt verbalized understanding.  Pt also given  referal information for OP treatment/counceling and was encouraged to follow up.

## 2011-04-08 NOTE — ED Notes (Signed)
Up to the desk to call for ride 

## 2011-04-08 NOTE — Discharge Instructions (Signed)
Chemical Dependency     Chemical dependency is an addiction to drugs or alcohol. It is characterized by the repeated behavior of seeking out and using drugs and alcohol despite harmful consequences to the health and safety of ones self and others.   RISK FACTORS  There are certain situations or behaviors that increase a person's risk for chemical dependency. These include:  · A family history of chemical dependency.   · A history of mental health issues, including depression and anxiety.   · A home environment where drugs and alcohol are easily available to you.   · Drug or alcohol use at a young age.   SYMPTOMS   The following symptoms can indicate chemical dependency:  · Inability to limit the use of drugs or alcohol.   · Nausea, sweating, shakiness, and anxiety that occurs when alcohol or drugs are not being used.   · An increase in amount of drugs or alcohol that is necessary to get drunk or high.   People who experience these symptoms can assess their use of drugs and alcohol by asking themselves the following questions:  · Have you been told by friends or family that they are worried about your use of alcohol or drugs?   · Do friends and family ever tell you about things you did while drinking alcohol or using drugs that you do not remember?   · Do you lie about using alcohol or drugs or about the amounts you use?   · Do you have difficulty completing daily tasks unless you use alcohol or drugs?   · Is the level of your work or school performance lower because of your drug or alcohol use?   · Do you get sick from using drugs or alcohol but keep using anyway?   · Do you feel uncomfortable in social situations unless you use alcohol or drugs?   · Do you use drugs or alcohol to help forget problems?    An answer of yes to any of these questions may indicate chemical dependency. Professional evaluation is suggested.  Document Released: 01/17/2001 Document Revised: 10/05/2010 Document Reviewed:  03/31/2010  Exit[employer]® Patient Information ©2012 [employer].

## 2011-04-08 NOTE — ED Notes (Addendum)
Calmer, sitting quietly watching tv.  Pt denies seeing the bugs (bed bugs) since arriving at the hospital and reports that she first saw them at home about 6 months ago and they thought they had gotten rid of them until recently.  Pt reports that the first night at the motel she saw one and that the moved to a new room, where she saw another one.

## 2012-01-31 ENCOUNTER — Emergency Department (HOSPITAL_COMMUNITY): Payer: BC Managed Care – PPO

## 2012-01-31 ENCOUNTER — Encounter (HOSPITAL_COMMUNITY): Payer: Self-pay | Admitting: Adult Health

## 2012-01-31 ENCOUNTER — Emergency Department (HOSPITAL_COMMUNITY)
Admission: EM | Admit: 2012-01-31 | Discharge: 2012-02-01 | Disposition: A | Discharge status: Home / Self Care | Payer: BC Managed Care – PPO | Attending: Emergency Medicine | Admitting: Emergency Medicine

## 2012-01-31 DIAGNOSIS — Z79899 Other long term (current) drug therapy: Secondary | ICD-10-CM | POA: Insufficient documentation

## 2012-01-31 DIAGNOSIS — Y92410 Unspecified street and highway as the place of occurrence of the external cause: Secondary | ICD-10-CM | POA: Insufficient documentation

## 2012-01-31 DIAGNOSIS — M545 Low back pain: Secondary | ICD-10-CM

## 2012-01-31 DIAGNOSIS — IMO0002 Reserved for concepts with insufficient information to code with codable children: Secondary | ICD-10-CM | POA: Diagnosis present

## 2012-01-31 DIAGNOSIS — Y9389 Activity, other specified: Secondary | ICD-10-CM | POA: Insufficient documentation

## 2012-01-31 DIAGNOSIS — F1910 Other psychoactive substance abuse, uncomplicated: Secondary | ICD-10-CM | POA: Diagnosis not present
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
## 2012-02-01 DIAGNOSIS — IMO0002 Reserved for concepts with insufficient information to code with codable children: Secondary | ICD-10-CM | POA: Diagnosis not present

## 2012-02-01 MED ORDER — IBUPROFEN 400 MG PO TABS
800.0000 mg | ORAL_TABLET | Freq: Once | ORAL | Status: AC
  Administered 2012-02-01: 800 mg via ORAL
  Filled 2012-02-01: qty 2

## 2012-02-01 MED ORDER — HYDROCODONE-ACETAMINOPHEN 5-325 MG PO TABS: 2.0000 | ORAL_TABLET | ORAL | Status: DC | PRN

## 2012-02-01 MED ORDER — OXYCODONE-ACETAMINOPHEN 5-325 MG PO TABS: 1.0000 | ORAL_TABLET | Freq: Once | ORAL | Status: DC

## 2012-02-01 MED ORDER — CYCLOBENZAPRINE HCL 5 MG PO TABS: 5.0000 mg | ORAL_TABLET | Freq: Three times a day (TID) | ORAL | Status: DC | PRN

## 2012-02-01 NOTE — ED Notes (Signed)
Pt alert and oriented, with steady gait at time of discharge. Pt given discharge papers and papers explained. All questions answered and pt walked to discharge.  
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
## 2012-02-03 NOTE — ED Provider Notes (Signed)
Medical screening examination/treatment/procedure(s) were performed by non-physician practitioner and as supervising physician I was immediately available for consultation/collaboration.   Lashaya Kienitz R Steen Bisig, MD 02/03/12 0941 

## 2012-04-03 ENCOUNTER — Other Ambulatory Visit (HOSPITAL_COMMUNITY): Payer: Self-pay | Admitting: Chiropractic Medicine

## 2012-04-03 DIAGNOSIS — R52 Pain, unspecified: Secondary | ICD-10-CM

## 2012-04-06 ENCOUNTER — Ambulatory Visit (HOSPITAL_COMMUNITY)
Admission: RE | Admit: 2012-04-06 | Discharge: 2012-04-06 | Disposition: A | Discharge status: Home / Self Care | Payer: Self-pay | Source: Ambulatory Visit | Attending: Chiropractic Medicine | Admitting: Chiropractic Medicine

## 2012-04-06 DIAGNOSIS — M48061 Spinal stenosis, lumbar region without neurogenic claudication: Secondary | ICD-10-CM | POA: Insufficient documentation

## 2012-04-06 DIAGNOSIS — R52 Pain, unspecified: Secondary | ICD-10-CM

## 2012-04-06 DIAGNOSIS — M545 Low back pain, unspecified: Secondary | ICD-10-CM | POA: Insufficient documentation

## 2012-04-06 DIAGNOSIS — M25559 Pain in unspecified hip: Secondary | ICD-10-CM | POA: Insufficient documentation

## 2012-09-04 ENCOUNTER — Other Ambulatory Visit: Payer: Self-pay | Admitting: Obstetrics and Gynecology

## 2012-09-23 DIAGNOSIS — M545 Low back pain: Secondary | ICD-10-CM | POA: Insufficient documentation

## 2014-05-11 ENCOUNTER — Other Ambulatory Visit: Payer: Self-pay | Admitting: Orthopedic Surgery

## 2014-05-27 ENCOUNTER — Inpatient Hospital Stay (HOSPITAL_COMMUNITY): Admission: RE | Admit: 2014-05-27 | Payer: Self-pay | Source: Ambulatory Visit

## 2014-05-28 ENCOUNTER — Encounter (HOSPITAL_COMMUNITY): Payer: Self-pay

## 2014-05-28 ENCOUNTER — Encounter (HOSPITAL_COMMUNITY)
Admission: RE | Admit: 2014-05-28 | Discharge: 2014-05-28 | Disposition: A | Discharge status: Home / Self Care | Payer: 59 | Source: Ambulatory Visit | Attending: Orthopedic Surgery | Admitting: Orthopedic Surgery

## 2014-05-28 DIAGNOSIS — Z01812 Encounter for preprocedural laboratory examination: Secondary | ICD-10-CM | POA: Insufficient documentation

## 2014-05-28 DIAGNOSIS — M79604 Pain in right leg: Secondary | ICD-10-CM | POA: Diagnosis not present

## 2014-05-28 DIAGNOSIS — Z87891 Personal history of nicotine dependence: Secondary | ICD-10-CM | POA: Diagnosis not present

## 2014-05-28 DIAGNOSIS — Z01810 Encounter for preprocedural cardiovascular examination: Secondary | ICD-10-CM | POA: Diagnosis not present

## 2014-05-28 DIAGNOSIS — Z0183 Encounter for blood typing: Secondary | ICD-10-CM | POA: Insufficient documentation

## 2014-05-28 DIAGNOSIS — M79605 Pain in left leg: Secondary | ICD-10-CM | POA: Insufficient documentation

## 2014-05-28 DIAGNOSIS — Z01818 Encounter for other preprocedural examination: Secondary | ICD-10-CM | POA: Insufficient documentation

## 2014-05-28 HISTORY — DX: Depression, unspecified: F32.A

## 2014-05-28 HISTORY — DX: Anesthesia of skin: R20.0

## 2014-05-28 HISTORY — DX: Dorsalgia, unspecified: M54.9

## 2014-05-28 HISTORY — DX: Unspecified osteoarthritis, unspecified site: M19.90

## 2014-05-28 HISTORY — DX: Major depressive disorder, single episode, unspecified: F32.9

## 2014-05-28 HISTORY — DX: Other chronic pain: G89.29

## 2014-05-28 LAB — URINALYSIS, ROUTINE W REFLEX MICROSCOPIC
Bilirubin Urine: NEGATIVE
Glucose, UA: NEGATIVE mg/dL
Hgb urine dipstick: NEGATIVE
Ketones, ur: NEGATIVE mg/dL
Leukocytes, UA: NEGATIVE
NITRITE: NEGATIVE
Protein, ur: NEGATIVE mg/dL
Specific Gravity, Urine: 1.013 (ref 1.005–1.030)
Urobilinogen, UA: 0.2 mg/dL (ref 0.0–1.0)
pH: 6 (ref 5.0–8.0)

## 2014-05-28 LAB — COMPREHENSIVE METABOLIC PANEL
ALBUMIN: 3.6 g/dL (ref 3.5–5.2)
ALT: 15 U/L (ref 0–35)
AST: 21 U/L (ref 0–37)
Alkaline Phosphatase: 54 U/L (ref 39–117)
Anion gap: 10 (ref 5–15)
BUN: 10 mg/dL (ref 6–23)
CALCIUM: 8.8 mg/dL (ref 8.4–10.5)
CHLORIDE: 105 mmol/L (ref 96–112)
CO2: 23 mmol/L (ref 19–32)
Creatinine, Ser: 0.67 mg/dL (ref 0.50–1.10)
GFR calc Af Amer: >90 mL/min (ref >90)
GFR calc non Af Amer: >90 mL/min (ref >90)
GLUCOSE: 102 mg/dL — AB (ref 70–99)
Potassium: 3.7 mmol/L (ref 3.5–5.1)
Sodium: 138 mmol/L (ref 135–145)
TOTAL PROTEIN: 6.2 g/dL (ref 6.0–8.3)
Total Bilirubin: 0.3 mg/dL (ref 0.3–1.2)

## 2014-05-28 LAB — CBC WITH DIFFERENTIAL/PLATELET
BASOS ABS: 0 10*3/uL (ref 0.0–0.1)
Basophils Relative: 0 % (ref 0–1)
Eosinophils Absolute: 0.2 10*3/uL (ref 0.0–0.7)
Eosinophils Relative: 2 % (ref 0–5)
HEMATOCRIT: 36.3 % (ref 36.0–46.0)
Hemoglobin: 12.3 g/dL (ref 12.0–15.0)
LYMPHS PCT: 27 % (ref 12–46)
Lymphs Abs: 3.5 10*3/uL (ref 0.7–4.0)
MCH: 29.9 pg (ref 26.0–34.0)
MCHC: 33.9 g/dL (ref 30.0–36.0)
MCV: 88.1 fL (ref 78.0–100.0)
MONOS PCT: 5 % (ref 3–12)
Monocytes Absolute: 0.7 10*3/uL (ref 0.1–1.0)
NEUTROS ABS: 8.5 10*3/uL — AB (ref 1.7–7.7)
Neutrophils Relative %: 66 % (ref 43–77)
Platelets: 217 10*3/uL (ref 150–400)
RBC: 4.12 MIL/uL (ref 3.87–5.11)
RDW: 13.7 % (ref 11.5–15.5)
WBC: 13 10*3/uL — AB (ref 4.0–10.5)

## 2014-05-28 LAB — TYPE AND SCREEN
ABO/RH(D): B POS
ANTIBODY SCREEN: NEGATIVE

## 2014-05-28 LAB — SURGICAL PCR SCREEN
MRSA, PCR: NEGATIVE
Staphylococcus aureus: NEGATIVE

## 2014-05-28 LAB — APTT: aPTT: 35 seconds (ref 24–37)

## 2014-05-28 LAB — PROTIME-INR
INR: 1.03 (ref 0.00–1.49)
Prothrombin Time: 13.6 seconds (ref 11.6–15.2)

## 2014-05-28 LAB — HCG, SERUM, QUALITATIVE: Preg, Serum: NEGATIVE

## 2014-05-28 LAB — ABO/RH: ABO/RH(D): B POS

## 2014-05-28 MED ORDER — POVIDONE-IODINE 7.5 % EX SOLN: Freq: Once | CUTANEOUS | Status: DC

## 2014-05-28 NOTE — Pre-Procedure Instructions (Signed)
[name]  05/28/2014   Your procedure is scheduled on:  Thurs, April 28 @ 7:30 AM  Report to [facility] and go to Admitting at 5:30 AM.  Call this number if you have problems the morning of surgery: [phone]   Remember:   Do not eat food or drink liquids after midnight.   Take these medicines the morning of surgery with A SIP OF WATER: Pain Pill(if needed)               No Goody's,BC's,Aleve,Aspirin,Ibuprofen,Fish Oil,or any Herbal Medications.    Do not wear jewelry, make-up or nail polish.  Do not wear lotions, powders, or perfumes. You may wear deodorant.  Do not shave 48 hours prior to surgery.   Do not bring valuables to the hospital.  [facility] is not responsible                  for any belongings or valuables.               Contacts, dentures or bridgework may not be worn into surgery.  Leave suitcase in the car. After surgery it may be brought to your room.  For patients admitted to the hospital, discharge time is determined by your                treatment team.                   Special Instructions:  [facility] - Preparing for Surgery  Before surgery, you can play an important role.  Because skin is not sterile, your skin needs to be as free of germs as possible.  You can reduce the number of germs on you skin by washing with CHG (chlorahexidine gluconate) soap before surgery.  CHG is an antiseptic cleaner which kills germs and bonds with the skin to continue killing germs even after washing.  Please DO NOT use if you have an allergy to CHG or antibacterial soaps.  If your skin becomes reddened/irritated stop using the CHG and inform your nurse when you arrive at Short Stay.  Do not shave (including legs and underarms) for at least 48 hours prior to the first CHG shower.  You may shave your face.  Please follow these instructions carefully:   1.  Shower with CHG Soap the night before surgery and the                                morning of  Surgery.  2.  If you choose to wash your hair, wash your hair first as usual with your       normal shampoo.  3.  After you shampoo, rinse your hair and body thoroughly to remove the                      Shampoo.  4.  Use CHG as you would any other liquid soap.  You can apply chg directly       to the skin and wash gently with scrungie or a clean washcloth.  5.  Apply the CHG Soap to your body ONLY FROM THE NECK DOWN.        Do not use on open wounds or open sores.  Avoid contact with your eyes,       ears, mouth and genitals (private parts).  Wash genitals (private parts)  with your normal soap.  6.  Wash thoroughly, paying special attention to the area where your surgery        will be performed.  7.  Thoroughly rinse your body with warm water from the neck down.  8.  DO NOT shower/wash with your normal soap after using and rinsing off       the CHG Soap.  9.  Pat yourself dry with a clean towel.            10.  Wear clean pajamas.            11.  Place clean sheets on your bed the night of your first shower and do not        sleep with pets.  Day of Surgery  Do not apply any lotions/deoderants the morning of surgery.  Please wear clean clothes to the hospital/surgery center.     Please read over the following fact sheets that you were given: Pain Booklet, Coughing and Deep Breathing, Blood Transfusion Information, MRSA Information and Surgical Site Infection Prevention

## 2014-05-28 NOTE — Progress Notes (Addendum)
Pt doesn't have a Cardiologist  Denies ever having an Echo/stress test/heart cath  Denies EKG/CXR in past yr.  Kateri Mc with [facility] is Medical Md

## 2014-06-03 ENCOUNTER — Encounter (HOSPITAL_COMMUNITY): Payer: Self-pay | Admitting: Anesthesiology

## 2014-06-03 MED ORDER — CEFAZOLIN SODIUM-DEXTROSE 2-3 GM-% IV SOLR
2.0000 g | INTRAVENOUS | Status: AC
  Administered 2014-06-04: 2 g via INTRAVENOUS

## 2014-06-03 NOTE — H&P (Signed)
     PREOPERATIVE H&P  Chief Complaint: bilateral leg pain  HPI: [name] is a 39 y.o. female who presents with ongoing pain in the bilateral legs  MRI reveals DDD at L4/5, with a spondylolisthesis noted on x-rays  Patient has failed multiple forms of conservative care and continues to have pain (see office notes for additional details regarding the patient's full course of treatment)  Past Medical History  Diagnosis Date  . Substance abuse   . Numbness     right leg  . Arthritis   . Chronic back pain   . Depression     no meds    Past Surgical History  Procedure Laterality Date  . Bunionectomy Bilateral     screws  . Cholecystectomy    . Diagnostic laparoscopy     History   Social History  . Marital Status: Married    Spouse Name: N/A  . Number of Children: N/A  . Years of Education: N/A   Social History Main Topics  . Smoking status: Former Research scientist (life sciences)  . Smokeless tobacco: Not on file     Comment: quit smoking 3 months  . Alcohol Use: No  . Drug Use: No  . Sexual Activity: Yes   Other Topics Concern  . None   Social History Narrative   History reviewed. No pertinent family history. Allergies  Allergen Reactions  . Chantix [Varenicline]     hives   Prior to Admission medications   Medication Sig Start Date End Date Taking? Authorizing Provider  Buprenorphine HCl-Naloxone HCl (SUBOXONE) 8-2 MG FILM Place 0.5-1 Film under the tongue 3 (three) times daily. 1 film under the tongue in the morning and midday. 0.5 film at bedtime.   Yes Historical Provider, MD  HYDROcodone-acetaminophen (NORCO/VICODIN) 5-325 MG per tablet Take 2 tablets by mouth every 4 (four) hours as needed for pain. 02/01/12  Yes Junius Creamer, NP  cyclobenzaprine (FLEXERIL) 5 MG tablet Take 1 tablet (5 mg total) by mouth 3 (three) times daily as needed for muscle spasms. Patient not taking: Reported on 05/27/2014 02/01/12   Junius Creamer, NP     All other systems have been reviewed and  were otherwise negative with the exception of those mentioned in the HPI and as above.  Physical Exam: There were no vitals filed for this visit.  General: Alert, no acute distress Cardiovascular: No pedal edema Respiratory: No cyanosis, no use of accessory musculature Skin: No lesions in the area of chief complaint Neurologic: Sensation intact distally Psychiatric: Patient is competent for consent with normal mood and affect Lymphatic: No axillary or cervical lymphadenopathy   Assessment/Plan: Bilateral leg pain Plan for Procedure(s): POSTERIOR LUMBAR FUSION 1 LEVEL   Sinclair Ship, MD 06/03/2014 8:22 PM

## 2014-06-04 ENCOUNTER — Inpatient Hospital Stay (HOSPITAL_COMMUNITY): Payer: 59

## 2014-06-04 ENCOUNTER — Inpatient Hospital Stay (HOSPITAL_COMMUNITY)
Admission: RE | Admit: 2014-06-04 | Discharge: 2014-06-06 | DRG: 460 | Disposition: A | Discharge status: Home / Self Care | Payer: 59 | Source: Ambulatory Visit | Attending: Orthopedic Surgery | Admitting: Orthopedic Surgery

## 2014-06-04 ENCOUNTER — Inpatient Hospital Stay (HOSPITAL_COMMUNITY): Payer: 59 | Admitting: Anesthesiology

## 2014-06-04 ENCOUNTER — Encounter (HOSPITAL_COMMUNITY)
Admission: RE | Disposition: A | Discharge status: Home / Self Care | Payer: 59 | Source: Ambulatory Visit | Attending: Orthopedic Surgery

## 2014-06-04 DIAGNOSIS — Z87891 Personal history of nicotine dependence: Secondary | ICD-10-CM | POA: Diagnosis not present

## 2014-06-04 DIAGNOSIS — M4316 Spondylolisthesis, lumbar region: Secondary | ICD-10-CM | POA: Diagnosis present

## 2014-06-04 DIAGNOSIS — M79606 Pain in leg, unspecified: Secondary | ICD-10-CM | POA: Diagnosis present

## 2014-06-04 DIAGNOSIS — M5116 Intervertebral disc disorders with radiculopathy, lumbar region: Principal | ICD-10-CM | POA: Diagnosis present

## 2014-06-04 DIAGNOSIS — M5410 Radiculopathy, site unspecified: Secondary | ICD-10-CM | POA: Diagnosis present

## 2014-06-04 DIAGNOSIS — Z01818 Encounter for other preprocedural examination: Secondary | ICD-10-CM

## 2014-06-04 DIAGNOSIS — Z419 Encounter for procedure for purposes other than remedying health state, unspecified: Secondary | ICD-10-CM

## 2014-06-04 SURGERY — POSTERIOR LUMBAR FUSION 1 LEVEL
Anesthesia: General | Laterality: Right

## 2014-06-04 MED ORDER — ALUM & MAG HYDROXIDE-SIMETH 200-200-20 MG/5ML PO SUSP: 30.0000 mL | Freq: Four times a day (QID) | ORAL | Status: DC | PRN

## 2014-06-04 MED ORDER — BUPIVACAINE-EPINEPHRINE 0.25% -1:200000 IJ SOLN
INTRAMUSCULAR | Status: DC | PRN
  Administered 2014-06-04: 27 mL

## 2014-06-04 MED ORDER — ROCURONIUM BROMIDE 100 MG/10ML IV SOLN
INTRAVENOUS | Status: DC | PRN
  Administered 2014-06-04: 10 mg via INTRAVENOUS
  Administered 2014-06-04: 20 mg via INTRAVENOUS

## 2014-06-04 MED ORDER — PROPOFOL 10 MG/ML IV BOLUS
INTRAVENOUS | Status: DC | PRN
  Administered 2014-06-04: 160 mg via INTRAVENOUS

## 2014-06-04 MED ORDER — BISACODYL 5 MG PO TBEC: 5.0000 mg | DELAYED_RELEASE_TABLET | Freq: Every day | ORAL | Status: DC | PRN

## 2014-06-04 MED ORDER — METHYLENE BLUE 1 % INJ SOLN
INTRAMUSCULAR | Status: AC
  Filled 2014-06-04: qty 10

## 2014-06-04 MED ORDER — THROMBIN 20000 UNITS EX KIT
PACK | CUTANEOUS | Status: DC | PRN
  Administered 2014-06-04: 20 mL via TOPICAL

## 2014-06-04 MED ORDER — OXYCODONE HCL 5 MG/5ML PO SOLN: 5.0000 mg | Freq: Once | ORAL | Status: AC | PRN

## 2014-06-04 MED ORDER — ONDANSETRON HCL 4 MG/2ML IJ SOLN
INTRAMUSCULAR | Status: DC | PRN
  Administered 2014-06-04: 4 mg via INTRAVENOUS

## 2014-06-04 MED ORDER — ONDANSETRON HCL 4 MG/2ML IJ SOLN: 4.0000 mg | Freq: Four times a day (QID) | INTRAMUSCULAR | Status: DC | PRN

## 2014-06-04 MED ORDER — OXYCODONE-ACETAMINOPHEN 5-325 MG PO TABS
1.0000 | ORAL_TABLET | ORAL | Status: DC | PRN
  Administered 2014-06-04 – 2014-06-06 (×7): 2 via ORAL
  Filled 2014-06-04 (×7): qty 2

## 2014-06-04 MED ORDER — MIDAZOLAM HCL 5 MG/5ML IJ SOLN
INTRAMUSCULAR | Status: DC | PRN
  Administered 2014-06-04: 2 mg via INTRAVENOUS

## 2014-06-04 MED ORDER — CEFAZOLIN SODIUM 1-5 GM-% IV SOLN
1.0000 g | Freq: Three times a day (TID) | INTRAVENOUS | Status: AC
  Administered 2014-06-04 – 2014-06-05 (×2): 1 g via INTRAVENOUS
  Filled 2014-06-04 (×2): qty 50

## 2014-06-04 MED ORDER — OXYCODONE HCL 5 MG PO TABS
5.0000 mg | ORAL_TABLET | Freq: Once | ORAL | Status: AC | PRN
  Administered 2014-06-04: 5 mg via ORAL

## 2014-06-04 MED ORDER — SODIUM CHLORIDE 0.9 % IJ SOLN: 3.0000 mL | INTRAMUSCULAR | Status: DC | PRN

## 2014-06-04 MED ORDER — LACTATED RINGERS IV SOLN
INTRAVENOUS | Status: DC | PRN
  Administered 2014-06-04 (×2): via INTRAVENOUS

## 2014-06-04 MED ORDER — HYDROMORPHONE HCL 1 MG/ML IJ SOLN
0.2500 mg | INTRAMUSCULAR | Status: DC | PRN
  Administered 2014-06-04 (×2): 0.25 mg via INTRAVENOUS
  Administered 2014-06-04: 0.5 mg via INTRAVENOUS
  Administered 2014-06-04: 0.25 mg via INTRAVENOUS

## 2014-06-04 MED ORDER — SUFENTANIL CITRATE 50 MCG/ML IV SOLN
INTRAVENOUS | Status: AC
  Filled 2014-06-04: qty 1

## 2014-06-04 MED ORDER — SUCCINYLCHOLINE CHLORIDE 20 MG/ML IJ SOLN
INTRAMUSCULAR | Status: DC | PRN
  Administered 2014-06-04: 100 mg via INTRAVENOUS

## 2014-06-04 MED ORDER — LIDOCAINE HCL (CARDIAC) 20 MG/ML IV SOLN
INTRAVENOUS | Status: DC | PRN
  Administered 2014-06-04: 50 mg via INTRAVENOUS

## 2014-06-04 MED ORDER — SODIUM CHLORIDE 0.9 % IV SOLN: 250.0000 mL | INTRAVENOUS | Status: DC

## 2014-06-04 MED ORDER — PROPOFOL 10 MG/ML IV BOLUS
INTRAVENOUS | Status: AC
  Filled 2014-06-04: qty 20

## 2014-06-04 MED ORDER — ONDANSETRON HCL 4 MG/2ML IJ SOLN: 4.0000 mg | INTRAMUSCULAR | Status: DC | PRN

## 2014-06-04 MED ORDER — SODIUM CHLORIDE 0.9 % IV SOLN
0.2000 mg | INTRAVENOUS | Status: DC | PRN
  Administered 2014-06-04: .2 mg via INTRAVENOUS

## 2014-06-04 MED ORDER — ZOLPIDEM TARTRATE 5 MG PO TABS: 5.0000 mg | ORAL_TABLET | Freq: Every evening | ORAL | Status: DC | PRN

## 2014-06-04 MED ORDER — PHENOL 1.4 % MT LIQD: 1.0000 | OROMUCOSAL | Status: DC | PRN

## 2014-06-04 MED ORDER — MENTHOL 3 MG MT LOZG: 1.0000 | LOZENGE | OROMUCOSAL | Status: DC | PRN

## 2014-06-04 MED ORDER — DIAZEPAM 5 MG PO TABS
5.0000 mg | ORAL_TABLET | Freq: Four times a day (QID) | ORAL | Status: DC | PRN
  Administered 2014-06-05 – 2014-06-06 (×4): 5 mg via ORAL
  Filled 2014-06-04 (×4): qty 1

## 2014-06-04 MED ORDER — MIDAZOLAM HCL 2 MG/2ML IJ SOLN
INTRAMUSCULAR | Status: AC
  Filled 2014-06-04: qty 2

## 2014-06-04 MED ORDER — THROMBIN 20000 UNITS EX SOLR
CUTANEOUS | Status: AC
  Filled 2014-06-04: qty 20000

## 2014-06-04 MED ORDER — MORPHINE SULFATE 2 MG/ML IJ SOLN
1.0000 mg | INTRAMUSCULAR | Status: DC | PRN
  Administered 2014-06-04: 2 mg via INTRAVENOUS
  Administered 2014-06-05: 4 mg via INTRAVENOUS
  Administered 2014-06-05: 2 mg via INTRAVENOUS
  Filled 2014-06-04: qty 1
  Filled 2014-06-04: qty 2
  Filled 2014-06-04: qty 1

## 2014-06-04 MED ORDER — HYDROMORPHONE HCL 1 MG/ML IJ SOLN
INTRAMUSCULAR | Status: AC
  Filled 2014-06-04: qty 1

## 2014-06-04 MED ORDER — SENNOSIDES-DOCUSATE SODIUM 8.6-50 MG PO TABS: 1.0000 | ORAL_TABLET | Freq: Every evening | ORAL | Status: DC | PRN

## 2014-06-04 MED ORDER — DOCUSATE SODIUM 100 MG PO CAPS
100.0000 mg | ORAL_CAPSULE | Freq: Two times a day (BID) | ORAL | Status: DC
  Administered 2014-06-04 – 2014-06-06 (×4): 100 mg via ORAL
  Filled 2014-06-04 (×3): qty 1

## 2014-06-04 MED ORDER — OXYCODONE HCL 5 MG PO TABS
ORAL_TABLET | ORAL | Status: AC
  Filled 2014-06-04: qty 1

## 2014-06-04 MED ORDER — BUPIVACAINE-EPINEPHRINE (PF) 0.25% -1:200000 IJ SOLN
INTRAMUSCULAR | Status: AC
  Filled 2014-06-04: qty 30

## 2014-06-04 MED ORDER — GLYCOPYRROLATE 0.2 MG/ML IJ SOLN
INTRAMUSCULAR | Status: DC | PRN
  Administered 2014-06-04: .7 mg via INTRAVENOUS

## 2014-06-04 MED ORDER — 0.9 % SODIUM CHLORIDE (POUR BTL) OPTIME
TOPICAL | Status: DC | PRN
  Administered 2014-06-04: 3000 mL

## 2014-06-04 MED ORDER — ACETAMINOPHEN 325 MG PO TABS: 650.0000 mg | ORAL_TABLET | ORAL | Status: DC | PRN

## 2014-06-04 MED ORDER — ACETAMINOPHEN 650 MG RE SUPP: 650.0000 mg | RECTAL | Status: DC | PRN

## 2014-06-04 MED ORDER — FLEET ENEMA 7-19 GM/118ML RE ENEM: 1.0000 | ENEMA | Freq: Once | RECTAL | Status: AC | PRN

## 2014-06-04 MED ORDER — SODIUM CHLORIDE 0.9 % IJ SOLN
3.0000 mL | Freq: Two times a day (BID) | INTRAMUSCULAR | Status: DC
  Administered 2014-06-05: 3 mL via INTRAVENOUS

## 2014-06-04 MED ORDER — SUFENTANIL CITRATE 50 MCG/ML IV SOLN
INTRAVENOUS | Status: DC | PRN
  Administered 2014-06-04 (×2): 25 ug via INTRAVENOUS

## 2014-06-04 SURGICAL SUPPLY — 82 items
BENZOIN TINCTURE PRP APPL 2/3 (GAUZE/BANDAGES/DRESSINGS) ×3 IMPLANT
BLADE SURG ROTATE 9660 (MISCELLANEOUS) IMPLANT
BUR PRESCISION 1.7 ELITE (BURR) ×3 IMPLANT
BUR ROUND PRECISION 4.0 (BURR) ×2 IMPLANT
BUR ROUND PRECISION 4.0MM (BURR) ×1
CAGE BULLET CONCORDE 9X9X27 (Cage) ×2 IMPLANT
CAGE BULLET CONCORDE 9X9X27MM (Cage) ×1 IMPLANT
CARTRIDGE OIL MAESTRO DRILL (MISCELLANEOUS) ×1 IMPLANT
CLOSURE STERI-STRIP 1/2X4 (GAUZE/BANDAGES/DRESSINGS) ×1
CLOSURE WOUND 1/2 X4 (GAUZE/BANDAGES/DRESSINGS)
CLSR STERI-STRIP ANTIMIC 1/2X4 (GAUZE/BANDAGES/DRESSINGS) ×2 IMPLANT
CONT SPEC STER OR (MISCELLANEOUS) ×3 IMPLANT
COVER MAYO STAND STRL (DRAPES) ×9 IMPLANT
COVER SURGICAL LIGHT HANDLE (MISCELLANEOUS) ×3 IMPLANT
DIFFUSER DRILL AIR PNEUMATIC (MISCELLANEOUS) ×3 IMPLANT
DRAIN CHANNEL 15F RND FF W/TCR (WOUND CARE) IMPLANT
DRAPE C-ARM 42X72 X-RAY (DRAPES) ×3 IMPLANT
DRAPE C-ARMOR (DRAPES) ×3 IMPLANT
DRAPE POUCH INSTRU U-SHP 10X18 (DRAPES) ×3 IMPLANT
DRAPE SURG 17X23 STRL (DRAPES) ×12 IMPLANT
DURAPREP 26ML APPLICATOR (WOUND CARE) ×3 IMPLANT
ELECT BLADE 4.0 EZ CLEAN MEGAD (MISCELLANEOUS) ×3
ELECT CAUTERY BLADE 6.4 (BLADE) ×3 IMPLANT
ELECT REM PT RETURN 9FT ADLT (ELECTROSURGICAL) ×3
ELECTRODE BLDE 4.0 EZ CLN MEGD (MISCELLANEOUS) ×1 IMPLANT
ELECTRODE REM PT RTRN 9FT ADLT (ELECTROSURGICAL) ×1 IMPLANT
EVACUATOR SILICONE 100CC (DRAIN) IMPLANT
GAUZE SPONGE 4X4 12PLY STRL (GAUZE/BANDAGES/DRESSINGS) ×3 IMPLANT
GAUZE SPONGE 4X4 16PLY XRAY LF (GAUZE/BANDAGES/DRESSINGS) IMPLANT
GLOVE BIO SURGEON STRL SZ7 (GLOVE) ×3 IMPLANT
GLOVE BIO SURGEON STRL SZ8 (GLOVE) ×3 IMPLANT
GLOVE BIOGEL PI IND STRL 7.0 (GLOVE) ×1 IMPLANT
GLOVE BIOGEL PI IND STRL 8 (GLOVE) ×1 IMPLANT
GLOVE BIOGEL PI INDICATOR 7.0 (GLOVE) ×2
GLOVE BIOGEL PI INDICATOR 8 (GLOVE) ×2
GOWN STRL REUS W/ TWL LRG LVL3 (GOWN DISPOSABLE) ×2 IMPLANT
GOWN STRL REUS W/ TWL XL LVL3 (GOWN DISPOSABLE) ×1 IMPLANT
GOWN STRL REUS W/TWL LRG LVL3 (GOWN DISPOSABLE) ×4
GOWN STRL REUS W/TWL XL LVL3 (GOWN DISPOSABLE) ×2
IV CATH 14GX2 1/4 (CATHETERS) ×3 IMPLANT
KIT BASIN OR (CUSTOM PROCEDURE TRAY) ×3 IMPLANT
KIT POSITION SURG JACKSON T1 (MISCELLANEOUS) ×3 IMPLANT
KIT ROOM TURNOVER OR (KITS) ×3 IMPLANT
MARKER SKIN DUAL TIP RULER LAB (MISCELLANEOUS) ×3 IMPLANT
MIX DBX 10CC 35% BONE (Bone Implant) ×3 IMPLANT
NDL SAFETY ECLIPSE 18X1.5 (NEEDLE) ×1 IMPLANT
NEEDLE 22X1 1/2 (OR ONLY) (NEEDLE) ×3 IMPLANT
NEEDLE BONE MARROW 8GX6 FENEST (NEEDLE) IMPLANT
NEEDLE HYPO 18GX1.5 SHARP (NEEDLE) ×2
NEEDLE HYPO 25GX1X1/2 BEV (NEEDLE) ×3 IMPLANT
NEEDLE SPNL 18GX3.5 QUINCKE PK (NEEDLE) ×6 IMPLANT
NEURO MONITORING STIM (LABOR (TRAVEL & OVERTIME)) ×3 IMPLANT
NS IRRIG 1000ML POUR BTL (IV SOLUTION) ×9 IMPLANT
OIL CARTRIDGE MAESTRO DRILL (MISCELLANEOUS) ×3
PACK LAMINECTOMY ORTHO (CUSTOM PROCEDURE TRAY) ×3 IMPLANT
PACK UNIVERSAL I (CUSTOM PROCEDURE TRAY) ×3 IMPLANT
PAD ARMBOARD 7.5X6 YLW CONV (MISCELLANEOUS) ×6 IMPLANT
PATTIES SURGICAL .5 X1 (DISPOSABLE) ×3 IMPLANT
PATTIES SURGICAL .5X1.5 (GAUZE/BANDAGES/DRESSINGS) ×3 IMPLANT
ROD PRE BENT EXP 40MM (Rod) ×6 IMPLANT
SCREW SET SINGLE INNER (Screw) ×12 IMPLANT
SCREW VIPER CORT FIX 5.00X40 (Screw) ×12 IMPLANT
SPONGE INTESTINAL PEANUT (DISPOSABLE) ×3 IMPLANT
SPONGE SURGIFOAM ABS GEL 100 (HEMOSTASIS) ×3 IMPLANT
STRIP CLOSURE SKIN 1/2X4 (GAUZE/BANDAGES/DRESSINGS) IMPLANT
SURGIFLO TRUKIT (HEMOSTASIS) IMPLANT
SUT BONE WAX W31G (SUTURE) ×6 IMPLANT
SUT MNCRL AB 4-0 PS2 18 (SUTURE) ×3 IMPLANT
SUT VIC AB 0 CT1 18XCR BRD 8 (SUTURE) ×2 IMPLANT
SUT VIC AB 0 CT1 8-18 (SUTURE) ×4
SUT VIC AB 1 CT1 18XCR BRD 8 (SUTURE) ×1 IMPLANT
SUT VIC AB 1 CT1 8-18 (SUTURE) ×2
SUT VIC AB 2-0 CT2 18 VCP726D (SUTURE) ×3 IMPLANT
SYR 20CC LL (SYRINGE) IMPLANT
SYR BULB IRRIGATION 50ML (SYRINGE) ×6 IMPLANT
SYR CONTROL 10ML LL (SYRINGE) ×3 IMPLANT
SYR TB 1ML LUER SLIP (SYRINGE) ×3 IMPLANT
TOWEL OR 17X24 6PK STRL BLUE (TOWEL DISPOSABLE) ×3 IMPLANT
TOWEL OR 17X26 10 PK STRL BLUE (TOWEL DISPOSABLE) ×3 IMPLANT
TRAY FOLEY CATH 16FRSI W/METER (SET/KITS/TRAYS/PACK) ×3 IMPLANT
WATER STERILE IRR 1000ML POUR (IV SOLUTION) ×3 IMPLANT
YANKAUER SUCT BULB TIP NO VENT (SUCTIONS) ×3 IMPLANT

## 2014-06-04 NOTE — Transfer of Care (Signed)
Immediate Anesthesia Transfer of Care Note  Patient: [name]  Procedure(s) Performed: Procedure(s) with comments: POSTERIOR LUMBAR FUSION 1 LEVEL (Right) - Right sided lumbar 4-5 transforaminal lumbar interbody fusion with instrumentation and allograft  Patient Location: PACU  Anesthesia Type:General  Level of Consciousness: awake, alert  and oriented  Airway & Oxygen Therapy: Patient Spontanous Breathing and Patient connected to nasal cannula oxygen  Post-op Assessment: Report given to RN and Post -op Vital signs reviewed and stable  Post vital signs: Reviewed and stable  Last Vitals:  Filed Vitals:   06/04/14 0629  BP: 98/50  Pulse: 68  Temp: 36.7 C  Resp: 16    Complications: No apparent anesthesia complications

## 2014-06-04 NOTE — Anesthesia Postprocedure Evaluation (Signed)
Anesthesia Post Note  Patient: [name]  Procedure(s) Performed: Procedure(s) (LRB): POSTERIOR LUMBAR FUSION 1 LEVEL (Right)  Anesthesia type: General  Patient location: PACU  Post pain: Pain level controlled and Adequate analgesia  Post assessment: Post-op Vital signs reviewed, Patient's Cardiovascular Status Stable, Respiratory Function Stable, Patent Airway and Pain level controlled  Last Vitals:  Filed Vitals:   06/04/14 1200  BP: 100/53  Pulse: 51  Temp:   Resp: 17    Post vital signs: Reviewed and stable  Level of consciousness: awake, alert  and oriented  Complications: No apparent anesthesia complications

## 2014-06-04 NOTE — Anesthesia Procedure Notes (Signed)
Procedure Name: Intubation Date/Time: 06/04/2014 7:46 AM Performed by: Eligha Bridegroom Pre-anesthesia Checklist: Patient identified, Emergency Drugs available, Suction available, Patient being monitored and Timeout performed Patient Re-evaluated:Patient Re-evaluated prior to inductionOxygen Delivery Method: Circle system utilized Preoxygenation: Pre-oxygenation with 100% oxygen Intubation Type: IV induction Ventilation: Mask ventilation without difficulty Laryngoscope Size: Mac and 4 Grade View: Grade II Tube type: Oral Tube size: 7.0 mm Number of attempts: 1 Airway Equipment and Method: Stylet and LTA kit utilized Placement Confirmation: ETT inserted through vocal cords under direct vision,  breath sounds checked- equal and bilateral and positive ETCO2 Secured at: 21 cm Dental Injury: Teeth and Oropharynx as per pre-operative assessment

## 2014-06-04 NOTE — Anesthesia Preprocedure Evaluation (Signed)
Anesthesia Evaluation  Patient identified by MRN, date of birth, ID band Patient awake    Reviewed: Allergy & Precautions, NPO status , Patient's Chart, lab work & pertinent test results  Airway Mallampati: II   Neck ROM: full    Dental   Pulmonary former smoker,  breath sounds clear to auscultation        Cardiovascular negative cardio ROS  Rhythm:regular Rate:Normal     Neuro/Psych Depression    GI/Hepatic (+)     substance abuse  ,   Endo/Other    Renal/GU      Musculoskeletal  (+) Arthritis -,   Abdominal   Peds  Hematology   Anesthesia Other Findings   Reproductive/Obstetrics                             Anesthesia Physical Anesthesia Plan  ASA: II  Anesthesia Plan: General   Post-op Pain Management:    Induction: Intravenous  Airway Management Planned: Oral ETT  Additional Equipment:   Intra-op Plan:   Post-operative Plan: Extubation in OR  Informed Consent: I have reviewed the patients History and Physical, chart, labs and discussed the procedure including the risks, benefits and alternatives for the proposed anesthesia with the patient or authorized representative who has indicated his/her understanding and acceptance.     Plan Discussed with: CRNA, Anesthesiologist and Surgeon  Anesthesia Plan Comments:         Anesthesia Quick Evaluation

## 2014-06-05 MED ORDER — OXYCODONE HCL ER 10 MG PO T12A
20.0000 mg | EXTENDED_RELEASE_TABLET | Freq: Two times a day (BID) | ORAL | Status: DC
  Administered 2014-06-05 – 2014-06-06 (×3): 20 mg via ORAL
  Filled 2014-06-05 (×3): qty 2

## 2014-06-05 MED FILL — Thrombin For Soln 20000 Unit: CUTANEOUS | Qty: 1 | Status: AC

## 2014-06-05 NOTE — Evaluation (Signed)
Occupational Therapy Evaluation Patient Details Name: [name] MRN: [id number] DOB: [birth year] Today's Date: 06/05/2014    History of Present Illness pt is 39 y/o female admitted with intractable pain in LE's s/p L45 PLA/TLIF.   Clinical Impression   This 39 yo female admitted and underwent above presents to acute OT with increased pain, decreased balance, decreased mobility, back precautions, TLSO all affecting her ability to care for herself at home at [facility] of Independent to Mod I. She will benefit from acute OT without need for follow up to get to a S level or better.             Precautions / Restrictions Precautions Precautions: Back Precaution Booklet Issued: Yes (comment) Precaution Comments: Can have brace off for trips to bathroom Required Braces or Orthoses: Spinal Brace Spinal Brace: Thoracolumbosacral orthotic;Applied in sitting position Restrictions Weight Bearing Restrictions: No      Mobility Bed Mobility Overal bed mobility: Needs Assistance Bed Mobility: Sit to Supine   Sit to supine: Mod assist (Bil LEs and VCs for sequence)    Transfers Overall transfer level: Needs assistance Equipment used: Rolling walker (2 wheeled) Transfers: Sit to/from Stand Sit to Stand: Min guard         General transfer comment: cues for hand placement    Balance Overall balance assessment: No apparent balance deficits (not formally assessed)                                          ADL Overall ADL's : Needs assistance/impaired Eating/Feeding: Independent;Bed level   Grooming: Set up;Bed level   Upper Body Bathing: Set up;Bed level   Lower Body Bathing: Maximal assistance (with min A sit<>stand)   Upper Body Dressing : Moderate assistance;Sitting   Lower Body Dressing: Total assistance (with min A sit<>stand)   Toilet Transfer: Min guard;Ambulation;RW;Comfort height toilet;Grab bars   Toileting- Clothing Manipulation and Hygiene:  Moderate assistance (with min A sit<>stand)               Vision Additional Comments: No change from baseline          Pertinent Vitals/Pain Pain Assessment: 0-10 Pain Score: 10-Worst pain ever Pain Location: bil LE's, trunk, incisional pain Pain Descriptors / Indicators: Aching;Burning Pain Intervention(s): Monitored during session;Repositioned;Patient requesting pain meds-RN notified     Hand Dominance Right   Extremity/Trunk Assessment Upper Extremity Assessment Upper Extremity Assessment: Overall WFL for tasks assessed   Lower Extremity Assessment Lower Extremity Assessment: Overall WFL for tasks assessed;Generalized weakness   Cervical / Trunk Assessment Cervical / Trunk Assessment: Normal   Communication Communication Communication: No difficulties   ognition Arousal/Alertness: Awake/alert Behavior During Therapy: WFL for tasks assessed/performed Overall Cognitive Status: Within Functional Limits for tasks assessed                                Home Living Family/patient expects to be discharged to:: Private residence Living Arrangements: Spouse/significant other;Children Available Help at Discharge: Family Type of Home: House Home Access: Stairs to enter Technical brewer of Steps: 4 Entrance Stairs-Rails: Right;Left Home Layout: Two level Alternate Level Stairs-Number of Steps: flight Alternate Level Stairs-Rails: Right;Left Bathroom Shower/Tub: Occupational psychologist: Standard     Home Equipment: None          Prior Functioning/Environment Level of Independence:  Independent             OT Diagnosis: Generalized weakness;Acute pain   OT Problem List: Decreased strength;Decreased range of motion;Decreased activity tolerance;Impaired balance (sitting and/or standing);Pain;Decreased knowledge of precautions;Decreased knowledge of use of DME or AE   OT Treatment/Interventions: Self-care/ADL training;DME and/or  AE instruction;Patient/family education;Balance training;Therapeutic activities    OT Goals(Current goals can be found in the care plan section) Acute Rehab OT Goals Patient Stated Goal: to get pain better under control OT Goal Formulation: With patient Time For Goal Achievement: 06/12/14 Potential to Achieve Goals: Good  OT Frequency: Min 2X/week   Barriers to D/C: Decreased caregiver support (husband goes back to work on Monday afternoon)             End of Session Equipment Utilized During Treatment: Back brace;Rolling walker Nurse Communication: Patient requests pain meds  Activity Tolerance: Patient limited by pain Patient left: in bed;with call bell/phone within reach;with family/visitor present   Time: 1012-1029 OT Time Calculation (min): 17 min Charges:  OT General Charges $OT Visit: 1 Procedure OT Evaluation $Initial OT Evaluation Tier I: 1 Procedure  Almon Register [phone] 06/05/2014, 11:43 AM

## 2014-06-05 NOTE — Op Note (Signed)
[name], [name]              ACCOUNT NO.:  [id number]  MEDICAL RECORD NO.:  [id number]  LOCATION:  3C08C                        FACILITY:  [place]  PHYSICIAN:  Phylliss Bob, MD      DATE OF BIRTH:  07/27/1975  DATE OF PROCEDURE:  06/04/2014                              OPERATIVE REPORT   PREOPERATIVE DIAGNOSES: 1. Bilateral leg pain. 2. Grade 1 L4-5 spondylolisthesis. 3. L4-5 degenerative disk disease.  POSTOPERATIVE DIAGNOSES: 1. Bilateral leg pain. 2. Grade 1 L4-5 spondylolisthesis. 3. L4-5 degenerative disk disease.  PROCEDURE: 1. Left-sided L4-5 transforaminal lumbar interbody fusion. 2. Right-sided L4-5 posterolateral fusion. 3. L4-5 decompression with bilateral partial facetectomy and     decompression of the bilateral L4-5 lateral recesses, requiring     more bone and soft tissue removal than that of which is required     for the fusion portion of the procedure. 4. Placement of posterior instrumentation (6 x 40 mm cortical pedicle     screws). 5. Insertion of interbody device x1 (9 x 27 mm Concorde bullet cage). 6. Use of local autograft. 7. Use of morselized allograft. 8. Intraoperative use of fluoroscopy.  SURGEON:  Phylliss Bob, MD  ASSISTANTS:  Pricilla Holm, PA-C  ANESTHESIA:  General endotracheal anesthesia.  COMPLICATIONS:  None.  DISPOSITION:  Stable.  ESTIMATED BLOOD LOSS:  50 mL.  INDICATIONS FOR SURGERY:  Briefly, [name] is a pleasant 39 year old female, who did present to me with severe and ongoing debilitating pain in her back and bilateral legs.  She has been managed by a Pain Clinic, and did continue to have ongoing and debilitating pain in both her legs and her back.  She was treated conservatively for an extensive period of time, however, she did continue to have ongoing and rather debilitating pain.  Of note, the patient was first evaluated by me approximately 2 years ago, but then did re-present with ongoing and debilitating  pain. Given her failure with multiple forms of nonoperative care, we did discuss proceeding with the surgery reflected above.  The patient did fully understand the risks and limitations of the surgery and did elect to proceed.  OPERATIVE DETAILS:  On June 03, 2014, patient was brought to surgery and general endotracheal anesthesia was administered.  The patient was placed prone on a well-padded flat Jackson bed with a spinal frame.  On June 04, 2014, patient was brought to surgery and general endotracheal anesthesia was administered.  The patient was placed prone on a well- padded flat Jackson bed with a spinal frame.  Antibiotics were given and a time-out procedure was performed.  The back was prepped and draped and a midline incision was made overlying the L4-5 intervertebral space. The fascia was incised at the midline and the musculature was retracted laterally.  I then identified the bilateral L4-5 facet joints.  The right posterolateral gutter was decorticated, as were the posterior elements on the right.  This was done to help aid in the success of the fusion.  I then used anatomic landmarks and fluoroscopy to cannulate the L4 and L5 pedicles.  I did sequentially tap up to a 5 mm tap.  On the right side, I did place 5  x 40 mm screws and a 45 mm rod was placed and distraction was applied across the rod and screws were provisionally tightened.  On the left side, after cannulating the pedicles, I did place bone wax.  I then performed a bilateral partial facetectomy in order to remove the significant facet hypertrophy, and decompress the bilateral L5 nerves.  This did require more bone and soft tissue removal than that of which is required for the fusion portion of the procedure. Next, I did perform a full facetectomy on the left.  With an assistant holding medial retraction of the L5 nerve on the left, I did perform an annulotomy and I did perform a thorough and complete L4-5  intervertebral diskectomy.  Once the endplates were prepared, the appropriate size intervertebral spacer was packed with DBX mix and tamped into position in the usual fashion.  Prior to placing the implant, I did liberally pack the intervertebral space with both autograft and allograft.  I was very pleased with the Press-Fit of the implant.  I did liberally use AP and lateral fluoroscopy while placing the implant, I was very pleased with the final appearance.  Of note, the wound was copiously irrigated with approximately 2 L of normal saline throughout the surgery.  I then placed 5 x 40 mm screws on the left.  The distraction was then discontinued on the contralateral side.  I then secured a rod into the left side and caps were placed and a final locking procedure was performed on both the right and on the left sides.  I was extremely pleased with the final AP and lateral fluoroscopic images noted on fluoroscopy.  There was no abnormal EMG activity noted throughout the surgery.  All undue bleeding was adequately controlled at the termination of the procedure.  I then closed the fascia using #1 Vicryl. The subcutaneous layer was closed using 2-0 Vicryl and the skin was closed using 3-0 Monocryl.  Benzoin and Steri-Strips were applied followed by sterile dressing.  All instrument counts were correct at the termination of the procedure.  Of note, Pricilla Holm, was my assistant throughout surgery, did aid in retraction, suctioning, and closure.     Phylliss Bob, MD     MD/MEDQ  D:  06/04/2014  T:  06/05/2014  Job:  [id number]  cc:   Kathyrn Lass, M.D.

## 2014-06-05 NOTE — Progress Notes (Signed)
    Patient doing OK, c/o back pain, has been up and walking last night and today to bathroom. Reports substantial LBP and stiffness, no leg pain. Has eaten and NL B/B function   Physical Exam: BP 108/64 mmHg  Pulse 77  Temp(Src) 98.6 F (37 C) (Oral)  Resp 18  Ht 5\' 4"  (1.626 m)  Wt 65.942 kg (145 lb 6 oz)  BMI 24.94 kg/m2  SpO2 96%  LMP 05/14/2014  Dressing in place, mild swelling about incision as expected, no drainage, SCD's in place in bed, ambulating slowly with rolling walker, TLSO at bedside, NVI  POD #1 s/p L4-5 TLIF  - up with PT/OT, encourage ambulation - Percocet for pain, Valium for muscle spasms - likely d/c home sat vs Sunday pending px control - Pt has hx of px mngmt and suboxone use, was d/c'd 2 days prior to surgery and opioids should not be inhibited at this venture. May consider addition of oxycontin if still substantially painful after lunch today

## 2014-06-05 NOTE — Evaluation (Signed)
Physical Therapy Evaluation Patient Details Name: [name] MRN: [id number] DOB: 11/11/1975 Today's Date: 06/05/2014   History of Present Illness  pt is 39 y/o female admitted with intractable pain in LE's s/p L45 PLA/TLIF.  Clinical Impression  Pt admitted with/for L45 PLA/TLIF.  Pt currently limited functionally due to the problems listed below.  (see problems list.)  Pt will benefit from PT to maximize function and safety to be able to get home safely with available assist of family.     Follow Up Recommendations No PT follow up;Supervision for mobility/OOB    Equipment Recommendations  Rolling walker with 5" wheels;3in1 (PT)    Recommendations for Other Services       Precautions / Restrictions Precautions Precautions: Back Required Braces or Orthoses: Spinal Brace Spinal Brace: Thoracolumbosacral orthotic;Applied in supine position      Mobility  Bed Mobility Overal bed mobility: Needs Assistance Bed Mobility: Rolling;Sidelying to Sit Rolling: Mod assist Sidelying to sit: Mod assist       General bed mobility comments: reinforced logroll and transition to/from side  Transfers Overall transfer level: Needs assistance   Transfers: Sit to/from Stand Sit to Stand: Min assist         General transfer comment: cues for hand placement  Ambulation/Gait Ambulation/Gait assistance: Min guard Ambulation Distance (Feet): 100 Feet Assistive device: Rolling walker (2 wheeled) Gait Pattern/deviations: Step-through pattern;Decreased step length - right;Decreased step length - left Gait velocity: slow and very guarded   General Gait Details: steady and guarded  Stairs            Wheelchair Mobility    Modified Rankin (Stroke Patients Only)       Balance Overall balance assessment: No apparent balance deficits (not formally assessed)                                           Pertinent Vitals/Pain Pain Assessment: 0-10 Pain  Score: 10-Worst pain ever Pain Location: bil LE's, trunk, incisional pain Pain Descriptors / Indicators: Aching;Burning    Home Living Family/patient expects to be discharged to:: Private residence Living Arrangements: Spouse/significant other;Children Available Help at Discharge: Family Type of Home: House Home Access: Stairs to enter Entrance Stairs-Rails: Psychiatric nurse of Steps: 4 Home Layout: Two level Home Equipment: None      Prior Function Level of Independence: Independent               Hand Dominance        Extremity/Trunk Assessment   Upper Extremity Assessment: Defer to OT evaluation           Lower Extremity Assessment: Overall WFL for tasks assessed;Generalized weakness      Cervical / Trunk Assessment: Normal  Communication   Communication: No difficulties  Cognition Arousal/Alertness: Awake/alert Behavior During Therapy: Agitated Overall Cognitive Status: Within Functional Limits for tasks assessed                      General Comments General comments (skin integrity, edema, etc.): Educated pt and husband in back care/precautions, log roll/transition side to/from sit,  lifting  restrictions, bracing issues, progression of activity.    Exercises        Assessment/Plan    PT Assessment Patient needs continued PT services  PT Diagnosis Difficulty walking;Acute pain   PT Problem List Decreased strength;Decreased activity tolerance;Decreased mobility;Decreased knowledge of use  of DME;Pain;Decreased knowledge of precautions  PT Treatment Interventions DME instruction;Gait training;Stair training;Functional mobility training;Therapeutic activities;Patient/family education   PT Goals (Current goals can be found in the Care Plan section) Acute Rehab PT Goals Patient Stated Goal: tolerable pain, independence PT Goal Formulation: With patient Time For Goal Achievement: 06/12/14 Potential to Achieve Goals: Good     Frequency Min 5X/week   Barriers to discharge        Co-evaluation               End of Session Equipment Utilized During Treatment: Back brace Activity Tolerance: Patient limited by pain Patient left: in bed;with call bell/phone within reach Nurse Communication: Mobility status         Time: 0950-1017 PT Time Calculation (min) (ACUTE ONLY): 27 min   Charges:   PT Evaluation $Initial PT Evaluation Tier I: 1 Procedure PT Treatments $Gait Training: 8-22 mins   PT G Codes:        [name], [name] 06/05/2014, 10:38 AM 06/05/2014  Donnella Sham, PT [phone]  (pager)

## 2014-06-06 NOTE — Discharge Instructions (Signed)
See pre-printed d/c instructions

## 2014-06-06 NOTE — Progress Notes (Signed)
Occupational Therapy Treatment and Discharge Patient Details Name: [name] MRN: [id number] DOB: [birth year] Today's Date: 06/06/2014    History of present illness pt is 39 y/o female admitted with intractable pain in LE's s/p L45 PLA/TLIF.   OT comments  This 39 yo female admitted and underwent above presents to acute OT with all education completed and pt without further questions about BADLs. Acute OT will sign off.  Follow Up Recommendations  No OT follow up    Equipment Recommendations  3 in 1 bedside comode       Precautions / Restrictions Precautions Precautions: Back Precaution Booklet Issued: Yes (comment) Precaution Comments: Can have brace off for trips to bathroom Required Braces or Orthoses: Spinal Brace Spinal Brace: Thoracolumbosacral orthotic;Applied in sitting position Restrictions Weight Bearing Restrictions: No       Mobility Bed Mobility Overal bed mobility: Needs Assistance Sidelying to sit: Supervision     Sit to sidelying: Supervision (HOB down and no rail)  Transfers Overall transfer level: Needs assistance Equipment used: None Transfers: Sit to/from Stand Sit to Stand: Supervision            Balance Overall balance assessment: No apparent balance deficits (not formally assessed)                                 ADL Overall ADL's : Needs assistance/impaired                       Lower Body Dressing Details (indicate cue type and reason): Pt has a little trouble reaching her feet to get her socks off and on--I showed her AE and let her practice with sock aid however she stated she preferred to just try and do it by crossing her legs. I also educated her on how she could turn sideways each direction while seated on EOB to bring one leg up on the bed or could lay supine and bring legs up to get socks on and off (as shown on post op back handout we gave her yesterday). She reports that toileting is not an issue as  far as hygiene. She has a 3n1 in her room that she will be taking home to increase height of toilet. Toilet Transfer: Supervision/safety;Ambulation                    Vision                 Additional Comments: No change from baseline          Cognition   Behavior During Therapy: WFL for tasks assessed/performed Overall Cognitive Status: Within Functional Limits for tasks assessed                                    Pertinent Vitals/ Pain       Pain Assessment: 0-10 Pain Score: 9  Pain Location: bil LE's, trunk, incisional pain Pain Descriptors / Indicators: Aching;Burning Pain Intervention(s): Limited activity within patient's tolerance;Monitored during session;Repositioned         Frequency Min 2X/week     Progress Toward Goals  OT Goals(current goals can now be found in the care plan section)  Progress towards OT goals:  (All education complete)  Acute Rehab OT Goals Patient Stated Goal: to get pain better under control  Plan Discharge plan  remains appropriate       End of Session Equipment Utilized During Treatment:  (None)   Activity Tolerance Patient limited by pain   Patient Left in bed;with call bell/phone within reach   Nurse Communication Patient requests pain meds        Time: 4458-4835 OT Time Calculation (min): 12 min  Charges: OT General Charges $OT Visit: 1 Procedure OT Treatments $Self Care/Home Management : 8-22 mins  Almon Register [phone] 06/06/2014, 10:44 AM

## 2014-06-06 NOTE — Progress Notes (Signed)
    Patient doing OK, c/o back pain, but now on just PO meds.  Cleared PT for home d/c just a short while ago.  Tol PO and abd soft, but no BM yet   Physical Exam: BP 102/53 mmHg  Pulse 67  Temp(Src) 98.2 F (36.8 C) (Oral)  Resp 18  Ht 5\' 4"  (1.626 m)  Wt 65.942 kg (145 lb 6 oz)  BMI 24.94 kg/m2  SpO2 99%  LMP 05/14/2014  Dressing in place, mild swelling about incision as expected, no drainage, SCD's off, TLSO at bedside, NVI  POD #2 s/p L4-5 TLIF  - cleared PT for d/c home - Percocet for pain, Valium for muscle spasms for home d/c today.

## 2014-06-06 NOTE — Progress Notes (Signed)
Patient alert and oriented, mae's well, voiding adequate amount of urine, swallowing without difficulty, c/o moderate pain and meds given prior to discharged. Patient discharged home with family. Script and discharged instructions given to patient. Patient and family stated understanding of instructions given.  

## 2014-06-06 NOTE — Progress Notes (Signed)
Physical Therapy Treatment Patient Details Name: [name] MRN: [id number] DOB: 08-08-1975 Today's Date: 06/06/2014    History of Present Illness pt is 39 y/o female admitted with intractable pain in LE's s/p L45 PLA/TLIF.    PT Comments    Pt progressing towards physical therapy goals. Demonstrated frequent trunk flexion throughout mobility and pt was cued for improved posture and to maintain back precautions. Pt was able to recall 3/3 back precautions when asked. She reports that currently she does not know who will be picking her up to go home, or who will be staying with her. This is a change from evaluation, when husband was to be available. Encouraged pt to work out 24 hour assist at least the first few days she is home for safety. Will continue to follow and progress as able per POC.     Follow Up Recommendations  No PT follow up;Supervision for mobility/OOB     Equipment Recommendations  Rolling walker with 5" wheels;3in1 (PT)    Recommendations for Other Services       Precautions / Restrictions Precautions Precautions: Back Precaution Booklet Issued: Yes (comment) Precaution Comments: Can have brace off for trips to bathroom Required Braces or Orthoses: Spinal Brace Spinal Brace: Thoracolumbosacral orthotic;Applied in sitting position Restrictions Weight Bearing Restrictions: No    Mobility  Bed Mobility Overal bed mobility: Needs Assistance Bed Mobility: Rolling;Sidelying to Sit Rolling: Supervision Sidelying to sit: Supervision       General bed mobility comments: Reinforced log roll technique and back precautions. Pt with grossly flexed trunk during all mobility.   Transfers Overall transfer level: Needs assistance Equipment used: None Transfers: Sit to/from Stand Sit to Stand: Min guard         General transfer comment: VC's for hand placement on seated surface for safety as well as maintainence of back precautions.    Ambulation/Gait Ambulation/Gait assistance: Supervision Ambulation Distance (Feet): 200 Feet Assistive device: None Gait Pattern/deviations: Step-through pattern;Decreased stride length;Trunk flexed Gait velocity: Decreased Gait velocity interpretation: Below normal speed for age/gender General Gait Details: Grossly steady and guarded. Pt was cued for improved posture however did not make corrective changes. Recommended continued use of the walker to maintain back precautions/posture and for energy conservation.    Stairs Stairs: Yes Stairs assistance: Min guard Stair Management: One rail Right Number of Stairs: 5 General stair comments: VC's for general safety awareness, sequencing, and maintanence of back precautions  Wheelchair Mobility    Modified Rankin (Stroke Patients Only)       Balance Overall balance assessment: No apparent balance deficits (not formally assessed)                                  Cognition Arousal/Alertness: Lethargic Behavior During Therapy: WFL for tasks assessed/performed Overall Cognitive Status: Within Functional Limits for tasks assessed                      Exercises      General Comments        Pertinent Vitals/Pain Pain Assessment: 0-10 Pain Score: 9  Pain Location: bil LE's, trunk, incisional pain Pain Descriptors / Indicators: Aching;Burning Pain Intervention(s): Limited activity within patient's tolerance;Monitored during session;Repositioned    Home Living                      Prior Function  PT Goals (current goals can now be found in the care plan section) Acute Rehab PT Goals Patient Stated Goal: to get pain better under control PT Goal Formulation: With patient Time For Goal Achievement: 06/12/14 Potential to Achieve Goals: Good Progress towards PT goals: Progressing toward goals    Frequency  Min 5X/week    PT Plan Current plan remains appropriate     Co-evaluation             End of Session Equipment Utilized During Treatment: Back brace Activity Tolerance: No increased pain Patient left: in chair;with call bell/phone within reach     Time: 0741-0800 PT Time Calculation (min) (ACUTE ONLY): 19 min  Charges:  $Gait Training: 8-22 mins                    G Codes:      Rolinda Roan 19-Jun-2014, 10:21 AM   Rolinda Roan, PT, DPT Acute Rehabilitation Services Pager: [phone]

## 2014-06-06 NOTE — Discharge Summary (Signed)
Physician Discharge Summary  Patient ID: [name] MRN: [id number] DOB/AGE: 1975/02/18 39 y.o.  Admit date: 06/04/2014 Discharge date: 06/06/2014  Admission Diagnoses:  Spondylolisthesis with radiculopathy  Discharge Diagnoses:  Active Problems:   Radiculopathy   Past Medical History  Diagnosis Date  . Substance abuse   . Numbness     right leg  . Arthritis   . Chronic back pain   . Depression     no meds     Surgeries: Procedure(s): POSTERIOR LUMBAR FUSION 1 LEVEL on 06/04/2014   Consultants (if any):    Discharged Condition: Improved  Hospital Course: [name] is an 39 y.o. female who was admitted 06/04/2014 with a diagnosis of spondylolisthesis with radiculopathy and went to the operating room on 06/04/2014 and underwent the above named procedures.    She was given perioperative antibiotics:  Anti-infectives    Start     Dose/Rate Route Frequency Ordered Stop   06/04/14 1900  ceFAZolin (ANCEF) IVPB 1 g/50 mL premix     1 g 100 mL/hr over 30 Minutes Intravenous Every 8 hours 06/04/14 1828 06/05/14 0303   06/04/14 0715  ceFAZolin (ANCEF) IVPB 2 g/50 mL premix     2 g 100 mL/hr over 30 Minutes Intravenous To Surgery 06/03/14 1251 06/04/14 0750    .  She was given sequential compression devices and early ambulation for DVT prophylaxis.  She benefited maximally from the hospital stay and there were no complications. She cleared PT on day of d/c for d/c home.  TLSO brace to be applied when upright for ambulating and prolonged sitting.  Recent vital signs:  Filed Vitals:   06/06/14 0821  BP: 102/53  Pulse: 67  Temp: 98.2 F (36.8 C)  Resp: 18    Recent laboratory studies:  Lab Results  Component Value Date   HGB 12.3 05/28/2014   HGB 12.8 04/08/2011   HGB 13.8 11/01/2007   Lab Results  Component Value Date   WBC 13.0* 05/28/2014   PLT 217 05/28/2014   Lab Results  Component Value Date   INR 1.03 05/28/2014   Lab Results  Component  Value Date   NA 138 05/28/2014   K 3.7 05/28/2014   CL 105 05/28/2014   CO2 23 05/28/2014   BUN 10 05/28/2014   CREATININE 0.67 05/28/2014   GLUCOSE 102* 05/28/2014    Discharge Medications:     Medication List    STOP taking these medications        HYDROcodone-acetaminophen 7.5-325 MG per tablet  Commonly known as:  Purple Sage these medications        cyclobenzaprine 5 MG tablet  Commonly known as:  FLEXERIL  Take 1 tablet (5 mg total) by mouth 3 (three) times daily as needed for muscle spasms.     SUBOXONE 8-2 MG Film  Generic drug:  Buprenorphine HCl-Naloxone HCl  Place 0.5-1 Film under the tongue 3 (three) times daily. 1 film under the tongue in the morning and midday. 0.5 film at bedtime.        Diagnostic Studies: Dg Chest 2 View  05/28/2014   CLINICAL DATA:  Preop for lumbar spine surgery, former smoking history  EXAM: CHEST  2 VIEW  COMPARISON:  None.  FINDINGS: No active infiltrate or effusion is seen. Mediastinal and hilar contours are unremarkable. The heart is within normal limits in size. No bony abnormality is seen.  IMPRESSION: No active cardiopulmonary disease.   Electronically Signed  By: Ivar Drape M.D.   On: 05/28/2014 08:59   Dg Lumbar Spine 2-3 Views  06/04/2014   CLINICAL DATA:  Lumbar disc disease.  EXAM: DG C-ARM GT 120 MIN; LUMBAR SPINE - 2-3 VIEW  FLUOROSCOPY TIME:  Fluoroscopy Time (in minutes and seconds): 0 min 21 seconds  Number of Acquired Images:  2 freeze frame C-arm images  COMPARISON:  Radiographs dated 06/04/2014  FINDINGS: C-arm images demonstrate interbody and posterior fusion at L4-5. Hardware appears in good position.  IMPRESSION: Fusion performed at L4-5.   Electronically Signed   By: Lorriane Shire M.D.   On: 06/04/2014 10:42   Dg Lumbar Spine 2-3 Views  06/04/2014   CLINICAL DATA:  L4-5 TLIF  EXAM: LUMBAR SPINE - 2-3 VIEW  COMPARISON:  MRI lumbar spine dated 04/06/2012  FINDINGS: Initial lateral radiograph demonstrates  surgical probes inferior to the L2 and L3 posterior spinous processes.  Second lateral radiograph demonstrates surgical hardware at L4-5.  IMPRESSION: Lumbar localization as above.   Electronically Signed   By: Julian Hy M.D.   On: 06/04/2014 09:31   Dg C-arm Gt 120 Min  06/04/2014   CLINICAL DATA:  Lumbar disc disease.  EXAM: DG C-ARM GT 120 MIN; LUMBAR SPINE - 2-3 VIEW  FLUOROSCOPY TIME:  Fluoroscopy Time (in minutes and seconds): 0 min 21 seconds  Number of Acquired Images:  2 freeze frame C-arm images  COMPARISON:  Radiographs dated 06/04/2014  FINDINGS: C-arm images demonstrate interbody and posterior fusion at L4-5. Hardware appears in good position.  IMPRESSION: Fusion performed at L4-5.   Electronically Signed   By: Lorriane Shire M.D.   On: 06/04/2014 10:42    Disposition: 01-Home or Self Care        Follow-up Information    Follow up with Sinclair Ship, MD.   Specialty:  Orthopedic Surgery   Contact information:   [address] [place] [place] [address] [phone]        Signed: Grandville Silos, Shantasia Hunnell A. 06/06/2014, 9:34 AM

## 2014-06-08 MED FILL — Heparin Sodium (Porcine) Inj 1000 Unit/ML: INTRAMUSCULAR | Qty: 30 | Status: AC

## 2014-06-08 MED FILL — Sodium Chloride Irrigation Soln 0.9%: Qty: 3000 | Status: AC

## 2014-06-08 MED FILL — Sodium Chloride IV Soln 0.9%: INTRAVENOUS | Qty: 1000 | Status: AC

## 2014-08-04 LAB — OB RESULTS CONSOLE RPR: RPR: NONREACTIVE

## 2014-08-04 LAB — OB RESULTS CONSOLE ABO/RH: RH Type: POSITIVE

## 2014-08-04 LAB — OB RESULTS CONSOLE ANTIBODY SCREEN: ANTIBODY SCREEN: NEGATIVE

## 2014-08-04 LAB — OB RESULTS CONSOLE GC/CHLAMYDIA
CHLAMYDIA, DNA PROBE: NEGATIVE
Gonorrhea: NEGATIVE

## 2014-08-04 LAB — OB RESULTS CONSOLE HIV ANTIBODY (ROUTINE TESTING): HIV: NONREACTIVE

## 2014-08-04 LAB — OB RESULTS CONSOLE HEPATITIS B SURFACE ANTIGEN: HEP B S AG: NEGATIVE

## 2014-08-04 LAB — OB RESULTS CONSOLE RUBELLA ANTIBODY, IGM: Rubella: IMMUNE

## 2014-08-19 ENCOUNTER — Other Ambulatory Visit: Payer: Self-pay | Admitting: Obstetrics and Gynecology

## 2014-08-20 LAB — CYTOLOGY - PAP

## 2014-09-27 ENCOUNTER — Encounter (HOSPITAL_COMMUNITY): Payer: Self-pay

## 2014-09-27 ENCOUNTER — Emergency Department (HOSPITAL_COMMUNITY)
Admission: EM | Admit: 2014-09-27 | Discharge: 2014-09-27 | Disposition: A | Discharge status: Home / Self Care | Payer: 59 | Attending: Emergency Medicine | Admitting: Emergency Medicine

## 2014-09-27 DIAGNOSIS — O9989 Other specified diseases and conditions complicating pregnancy, childbirth and the puerperium: Secondary | ICD-10-CM | POA: Insufficient documentation

## 2014-09-27 DIAGNOSIS — O99342 Other mental disorders complicating pregnancy, second trimester: Secondary | ICD-10-CM | POA: Diagnosis not present

## 2014-09-27 DIAGNOSIS — Z79899 Other long term (current) drug therapy: Secondary | ICD-10-CM | POA: Insufficient documentation

## 2014-09-27 DIAGNOSIS — R339 Retention of urine, unspecified: Secondary | ICD-10-CM | POA: Diagnosis not present

## 2014-09-27 DIAGNOSIS — Z3A16 16 weeks gestation of pregnancy: Secondary | ICD-10-CM | POA: Insufficient documentation

## 2014-09-27 DIAGNOSIS — O99352 Diseases of the nervous system complicating pregnancy, second trimester: Secondary | ICD-10-CM | POA: Diagnosis not present

## 2014-09-27 DIAGNOSIS — Z8739 Personal history of other diseases of the musculoskeletal system and connective tissue: Secondary | ICD-10-CM | POA: Insufficient documentation

## 2014-09-27 DIAGNOSIS — Z87891 Personal history of nicotine dependence: Secondary | ICD-10-CM | POA: Insufficient documentation

## 2014-09-27 DIAGNOSIS — G8929 Other chronic pain: Secondary | ICD-10-CM | POA: Diagnosis not present

## 2014-09-27 DIAGNOSIS — F329 Major depressive disorder, single episode, unspecified: Secondary | ICD-10-CM | POA: Diagnosis not present

## 2014-09-27 LAB — URINALYSIS, ROUTINE W REFLEX MICROSCOPIC
Bilirubin Urine: NEGATIVE
Glucose, UA: NEGATIVE mg/dL
Hgb urine dipstick: NEGATIVE
KETONES UR: NEGATIVE mg/dL
LEUKOCYTES UA: NEGATIVE
NITRITE: NEGATIVE
PROTEIN: NEGATIVE mg/dL
Specific Gravity, Urine: 1.005 (ref 1.005–1.030)
UROBILINOGEN UA: 0.2 mg/dL (ref 0.0–1.0)
pH: 7 (ref 5.0–8.0)

## 2014-09-27 LAB — COMPREHENSIVE METABOLIC PANEL
ALBUMIN: 3.2 g/dL — AB (ref 3.5–5.0)
ALK PHOS: 63 U/L (ref 38–126)
ALT: 14 U/L (ref 14–54)
AST: 22 U/L (ref 15–41)
Anion gap: 9 (ref 5–15)
BILIRUBIN TOTAL: 0.4 mg/dL (ref 0.3–1.2)
CO2: 20 mmol/L — AB (ref 22–32)
Calcium: 8.8 mg/dL — ABNORMAL LOW (ref 8.9–10.3)
Chloride: 107 mmol/L (ref 101–111)
Creatinine, Ser: 0.5 mg/dL (ref 0.44–1.00)
GFR calc Af Amer: >60 mL/min (ref >60)
GFR calc non Af Amer: >60 mL/min (ref >60)
GLUCOSE: 92 mg/dL (ref 65–99)
POTASSIUM: 3.6 mmol/L (ref 3.5–5.1)
SODIUM: 136 mmol/L (ref 135–145)
TOTAL PROTEIN: 6.1 g/dL — AB (ref 6.5–8.1)

## 2014-09-27 LAB — CBC WITH DIFFERENTIAL/PLATELET
BASOS ABS: 0 10*3/uL (ref 0.0–0.1)
BASOS PCT: 0 % (ref 0–1)
EOS ABS: 0.1 10*3/uL (ref 0.0–0.7)
Eosinophils Relative: 1 % (ref 0–5)
HCT: 33.9 % — ABNORMAL LOW (ref 36.0–46.0)
HEMOGLOBIN: 12 g/dL (ref 12.0–15.0)
Lymphocytes Relative: 29 % (ref 12–46)
Lymphs Abs: 2.7 10*3/uL (ref 0.7–4.0)
MCH: 30.2 pg (ref 26.0–34.0)
MCHC: 35.4 g/dL (ref 30.0–36.0)
MCV: 85.4 fL (ref 78.0–100.0)
Monocytes Absolute: 0.5 10*3/uL (ref 0.1–1.0)
Monocytes Relative: 5 % (ref 3–12)
Neutro Abs: 5.9 10*3/uL (ref 1.7–7.7)
Neutrophils Relative %: 65 % (ref 43–77)
Platelets: 179 10*3/uL (ref 150–400)
RBC: 3.97 MIL/uL (ref 3.87–5.11)
RDW: 13.4 % (ref 11.5–15.5)
WBC: 9.2 10*3/uL (ref 4.0–10.5)

## 2014-09-27 LAB — PREGNANCY, URINE: Preg Test, Ur: POSITIVE — AB

## 2014-09-27 MED ORDER — SODIUM CHLORIDE 0.9 % IV BOLUS (SEPSIS): 1000.0000 mL | Freq: Once | INTRAVENOUS | Status: DC

## 2014-09-27 NOTE — ED Notes (Signed)
EDP at bedside w/ [place].

## 2014-09-27 NOTE — ED Notes (Signed)
Bladder scanned pt - 700+mL in bladder. EDP aware - verbal order to insert foley cath.

## 2014-09-27 NOTE — ED Provider Notes (Signed)
CSN: [id number]     Arrival date & time 09/27/14  1032 History   First MD Initiated Contact with Patient 09/27/14 1045     Chief Complaint  Patient presents with  . Urinary Retention   HPI   39 year old female presents today with urinary retention. Patient reports that she woke up this morning with inability to urinate, suprapubic fullness and pain. Patient reports she had a brief episode of this approximately one week ago where she was unable to void completely, reports that since then she's been having normal urinary habits. Patient reports she is [redacted] weeks pregnant, followed by Dr. Matthew Saras, last seen 4 weeks ago with ultrasound showing normal intrauterine pregnancy. Patient reports she has one child, one miscarriage, reports normal pregnancy thus far. She denies abdominal pain prior to onset of urinary retention, denies vaginal bleeding, discharge. Patient reports she has a history of urinary tract infections, but reports this was a long time ago. Patient reports normal bowel movements., No blood per rectum.  Past Medical History  Diagnosis Date  . Substance abuse   . Numbness     right leg  . Arthritis   . Chronic back pain   . Depression     no meds    Past Surgical History  Procedure Laterality Date  . Bunionectomy Bilateral     screws  . Cholecystectomy    . Diagnostic laparoscopy     History reviewed. No pertinent family history. Social History  Substance Use Topics  . Smoking status: Former Research scientist (life sciences)  . Smokeless tobacco: None     Comment: quit smoking 3 months  . Alcohol Use: No   OB History    No data available     Review of Systems  All other systems reviewed and are negative.   Allergies  Chantix  Home Medications   Prior to Admission medications   Medication Sig Start Date End Date Taking? Authorizing Provider  buprenorphine (SUBUTEX) 8 MG SUBL SL tablet Place 8 mg under the tongue 2 (two) times daily. 09/25/14  Yes Historical Provider, MD   BP 104/43 mmHg   Pulse 60  Temp(Src) 97.9 F (36.6 C) (Oral)  Resp 20  SpO2 95%   Physical Exam  Constitutional: She is oriented to person, place, and time. She appears well-developed and well-nourished.  HENT:  Head: Normocephalic and atraumatic.  Eyes: Conjunctivae are normal. Pupils are equal, round, and reactive to light. Right eye exhibits no discharge. Left eye exhibits no discharge. No scleral icterus.  Neck: Normal range of motion. No JVD present. No tracheal deviation present.  Cardiovascular: Normal rate, regular rhythm, normal heart sounds and intact distal pulses.  Exam reveals no gallop and no friction rub.   No murmur heard. Pulmonary/Chest: Effort normal and breath sounds normal. No stridor. No respiratory distress. She has no wheezes. She has no rales. She exhibits no tenderness.  Abdominal:  Suprapubic tenderness to palpation, remainder of metabolic  Neurological: She is alert and oriented to person, place, and time. Coordination normal.  Skin: Skin is warm and dry. No rash noted. No erythema. No pallor.  Psychiatric: She has a normal mood and affect. Her behavior is normal. Judgment and thought content normal.  Nursing note and vitals reviewed.   ED Course  Procedures (including critical care time) EMERGENCY DEPARTMENT [place] PREGNANCY "Study: Limited Ultrasound of the Pelvis"  INDICATIONS:Pregnancy(required) Multiple views of the uterus and pelvic cavity are obtained with a multi-frequency probe.  APPROACH:Transabdominal   PERFORMED BY: Myself and Other (  See attached note)  IMAGES ARCHIVED?: Yes  LIMITATIONS: Body habitus  PREGNANCY FREE FLUID: None  PREGNANCY UTERUS FINDINGS:Uterus normal size ADNEXAL FINDINGS: No significant findings  PREGNANCY FINDINGS: Fetal heart activity seen  INTERPRETATION: Viable intrauterine pregnancy  GESTATIONAL AGE, ESTIMATE: 16  FETAL HEART RATE: 158  COMMENT(Estimate of Gestational Age):  Procedure completed by myself and Dr.  Darl Householder     Labs Review Labs Reviewed  PREGNANCY, URINE - Abnormal; Notable for the following:    Preg Test, Ur POSITIVE (*)    All other components within normal limits  CBC WITH DIFFERENTIAL/PLATELET - Abnormal; Notable for the following:    HCT 33.9 (*)    All other components within normal limits  COMPREHENSIVE METABOLIC PANEL - Abnormal; Notable for the following:    CO2 20 (*)    BUN <5 (*)    Calcium 8.8 (*)    Total Protein 6.1 (*)    Albumin 3.2 (*)    All other components within normal limits  URINALYSIS, ROUTINE W REFLEX MICROSCOPIC (NOT AT [facility])    Imaging Review No results found. I have personally reviewed and evaluated these images and lab results as part of my medical decision-making.   EKG Interpretation None      MDM   Final diagnoses:  Urinary retention    Labs: CBC, CMP and urinalysis, urine preg- positive pregnancy  Imaging: Bedside ultrasound   Consults:  Therapeutics:  Discharge Meds:   Assessment/Plan: Patient presents with urinary retention. Patient hadn't greater than 700 mL of urine in the bladder with bladder scan. Foley catheter placed with decompression of the bladder, patient had no tenderness to palpation of the abdomen post reduction. Patient had no other concerns today, bedside ultrasound showed viable intrauterine pregnancy with normal heart rate. No significant findings on ultrasound. Foley was left in place, patient was encouraged to follow up with her OB/GYN first thing Monday morning for reevaluation and management of her urinary retention. Laboratory data was reassuring, vital signs are reassuring, patient verbalized understanding and agreement for today's plan and had no further questions or concerns at time of discharge.        Okey Regal, PA-C 09/27/14 1212  Wandra Arthurs, MD 09/28/14 [phone]

## 2014-09-27 NOTE — Discharge Instructions (Signed)
Please contact your OB/GYN immediately and schedule follow-up evaluation for further evaluation and management. Urological consult information given, please touch base with OB and have them advise to urology consult. Monitor for new or worsening signs or symptoms return immediately if any present.

## 2014-09-27 NOTE — ED Notes (Signed)
Pt c/o urinary retention - used restroom at 0300 this morning but reports being unable to urinate since then. Pt "feels like she has to pee but can only pee a few drops." Pt [redacted] weeks pregnant. Pt was able to use restroom to urinate at this time.

## 2015-02-15 LAB — OB RESULTS CONSOLE GBS: GBS: NEGATIVE

## 2015-02-26 ENCOUNTER — Encounter (HOSPITAL_COMMUNITY): Payer: Self-pay | Admitting: *Deleted

## 2015-02-26 ENCOUNTER — Telehealth (HOSPITAL_COMMUNITY): Payer: Self-pay | Admitting: *Deleted

## 2015-02-26 NOTE — Telephone Encounter (Signed)
Preadmission screen  

## 2015-02-28 ENCOUNTER — Inpatient Hospital Stay (HOSPITAL_COMMUNITY): Payer: 59 | Admitting: Anesthesiology

## 2015-02-28 ENCOUNTER — Encounter (HOSPITAL_COMMUNITY)
Admission: AD | Disposition: A | Discharge status: Home / Self Care | Payer: Self-pay | Source: Ambulatory Visit | Attending: Obstetrics & Gynecology

## 2015-02-28 ENCOUNTER — Inpatient Hospital Stay (HOSPITAL_COMMUNITY)
Admission: AD | Admit: 2015-02-28 | Discharge: 2015-03-02 | DRG: 765 | Disposition: A | Discharge status: Home / Self Care | Payer: 59 | Source: Ambulatory Visit | Attending: Obstetrics & Gynecology | Admitting: Obstetrics & Gynecology

## 2015-02-28 ENCOUNTER — Encounter (HOSPITAL_COMMUNITY): Payer: Self-pay | Admitting: *Deleted

## 2015-02-28 ENCOUNTER — Inpatient Hospital Stay (HOSPITAL_COMMUNITY): Admission: RE | Admit: 2015-02-28 | Payer: 59 | Source: Ambulatory Visit

## 2015-02-28 DIAGNOSIS — O9832 Other infections with a predominantly sexual mode of transmission complicating childbirth: Secondary | ICD-10-CM | POA: Diagnosis present

## 2015-02-28 DIAGNOSIS — A6004 Herpesviral vulvovaginitis: Secondary | ICD-10-CM | POA: Diagnosis present

## 2015-02-28 DIAGNOSIS — F1120 Opioid dependence, uncomplicated: Secondary | ICD-10-CM | POA: Diagnosis present

## 2015-02-28 DIAGNOSIS — Z3A37 37 weeks gestation of pregnancy: Secondary | ICD-10-CM

## 2015-02-28 DIAGNOSIS — IMO0002 Reserved for concepts with insufficient information to code with codable children: Secondary | ICD-10-CM | POA: Diagnosis present

## 2015-02-28 DIAGNOSIS — O99324 Drug use complicating childbirth: Secondary | ICD-10-CM | POA: Diagnosis present

## 2015-02-28 DIAGNOSIS — Z98891 History of uterine scar from previous surgery: Secondary | ICD-10-CM

## 2015-02-28 DIAGNOSIS — O365930 Maternal care for other known or suspected poor fetal growth, third trimester, not applicable or unspecified: Principal | ICD-10-CM | POA: Diagnosis present

## 2015-02-28 DIAGNOSIS — Z87891 Personal history of nicotine dependence: Secondary | ICD-10-CM

## 2015-02-28 LAB — CBC
HEMATOCRIT: 35.1 % — AB (ref 36.0–46.0)
HEMOGLOBIN: 12.2 g/dL (ref 12.0–15.0)
MCH: 30.2 pg (ref 26.0–34.0)
MCHC: 34.8 g/dL (ref 30.0–36.0)
MCV: 86.9 fL (ref 78.0–100.0)
Platelets: 240 10*3/uL (ref 150–400)
RBC: 4.04 MIL/uL (ref 3.87–5.11)
RDW: 13.6 % (ref 11.5–15.5)
WBC: 18.2 10*3/uL — ABNORMAL HIGH (ref 4.0–10.5)

## 2015-02-28 LAB — TYPE AND SCREEN
ABO/RH(D): B POS
Antibody Screen: NEGATIVE

## 2015-02-28 LAB — ABO/RH: ABO/RH(D): B POS

## 2015-02-28 SURGERY — Surgical Case
Anesthesia: Spinal

## 2015-02-28 MED ORDER — FLEET ENEMA 7-19 GM/118ML RE ENEM: 1.0000 | ENEMA | RECTAL | Status: DC | PRN

## 2015-02-28 MED ORDER — SODIUM CHLORIDE 0.9 % IJ SOLN
3.0000 mL | INTRAMUSCULAR | Status: DC | PRN
Start: 2015-02-28 — End: 2015-03-02

## 2015-02-28 MED ORDER — IBUPROFEN 600 MG PO TABS
600.0000 mg | ORAL_TABLET | Freq: Four times a day (QID) | ORAL | Status: DC
  Administered 2015-03-01 – 2015-03-02 (×7): 600 mg via ORAL
  Filled 2015-02-28 (×7): qty 1

## 2015-02-28 MED ORDER — EPHEDRINE 5 MG/ML INJ
INTRAVENOUS | Status: AC
  Filled 2015-02-28: qty 10

## 2015-02-28 MED ORDER — LACTATED RINGERS IV SOLN: INTRAVENOUS | Status: DC

## 2015-02-28 MED ORDER — OXYCODONE-ACETAMINOPHEN 5-325 MG PO TABS
1.0000 | ORAL_TABLET | ORAL | Status: DC | PRN
  Administered 2015-03-02: 1 via ORAL
  Filled 2015-02-28 (×2): qty 1

## 2015-02-28 MED ORDER — ONDANSETRON HCL 4 MG/2ML IJ SOLN: 4.0000 mg | Freq: Three times a day (TID) | INTRAMUSCULAR | Status: DC | PRN

## 2015-02-28 MED ORDER — SIMETHICONE 80 MG PO CHEW
80.0000 mg | CHEWABLE_TABLET | Freq: Three times a day (TID) | ORAL | Status: DC
  Administered 2015-03-01 – 2015-03-02 (×5): 80 mg via ORAL
  Filled 2015-02-28 (×6): qty 1

## 2015-02-28 MED ORDER — LACTATED RINGERS IV SOLN
INTRAVENOUS | Status: DC | PRN
  Administered 2015-02-28: 11:00:00 via INTRAVENOUS

## 2015-02-28 MED ORDER — LACTATED RINGERS IV SOLN: 500.0000 mL | INTRAVENOUS | Status: DC | PRN

## 2015-02-28 MED ORDER — NALOXONE HCL 2 MG/2ML IJ SOSY
1.0000 ug/kg/h | PREFILLED_SYRINGE | INTRAVENOUS | Status: DC | PRN
  Filled 2015-02-28: qty 2

## 2015-02-28 MED ORDER — OXYTOCIN BOLUS FROM INFUSION: 500.0000 mL | INTRAVENOUS | Status: DC

## 2015-02-28 MED ORDER — OXYCODONE-ACETAMINOPHEN 5-325 MG PO TABS
2.0000 | ORAL_TABLET | ORAL | Status: DC | PRN
  Administered 2015-02-28 – 2015-03-02 (×7): 2 via ORAL
  Filled 2015-02-28 (×7): qty 2

## 2015-02-28 MED ORDER — ACETAMINOPHEN 500 MG PO TABS: 1000.0000 mg | ORAL_TABLET | Freq: Four times a day (QID) | ORAL | Status: AC

## 2015-02-28 MED ORDER — BUPRENORPHINE HCL 8 MG SL SUBL
8.0000 mg | SUBLINGUAL_TABLET | Freq: Two times a day (BID) | SUBLINGUAL | Status: DC
  Administered 2015-02-28 – 2015-03-01 (×2): 8 mg via SUBLINGUAL
  Administered 2015-03-01: 12 mg via SUBLINGUAL
  Administered 2015-03-02: 8 mg via SUBLINGUAL
  Filled 2015-02-28: qty 1
  Filled 2015-02-28: qty 2
  Filled 2015-02-28 (×2): qty 1

## 2015-02-28 MED ORDER — TETANUS-DIPHTH-ACELL PERTUSSIS 5-2.5-18.5 LF-MCG/0.5 IM SUSP
0.5000 mL | Freq: Once | INTRAMUSCULAR | Status: DC
  Filled 2015-02-28: qty 0.5

## 2015-02-28 MED ORDER — PROMETHAZINE HCL 25 MG/ML IJ SOLN: 6.2500 mg | INTRAMUSCULAR | Status: DC | PRN

## 2015-02-28 MED ORDER — FENTANYL CITRATE (PF) 100 MCG/2ML IJ SOLN
INTRAMUSCULAR | Status: DC | PRN
  Administered 2015-02-28: 10 ug via INTRATHECAL

## 2015-02-28 MED ORDER — DEXAMETHASONE SODIUM PHOSPHATE 10 MG/ML IJ SOLN
INTRAMUSCULAR | Status: AC
  Filled 2015-02-28: qty 1

## 2015-02-28 MED ORDER — DIPHENHYDRAMINE HCL 50 MG/ML IJ SOLN: 12.5000 mg | INTRAMUSCULAR | Status: DC | PRN

## 2015-02-28 MED ORDER — CITRIC ACID-SODIUM CITRATE 334-500 MG/5ML PO SOLN
30.0000 mL | ORAL | Status: DC | PRN
  Administered 2015-02-28: 30 mL via ORAL
  Filled 2015-02-28: qty 15

## 2015-02-28 MED ORDER — NALBUPHINE HCL 10 MG/ML IJ SOLN: 5.0000 mg | Freq: Once | INTRAMUSCULAR | Status: DC | PRN

## 2015-02-28 MED ORDER — SODIUM CHLORIDE 0.9 % IR SOLN
Status: DC | PRN
  Administered 2015-02-28: 1

## 2015-02-28 MED ORDER — OXYTOCIN 10 UNIT/ML IJ SOLN: 2.5000 [IU]/h | INTRAVENOUS | Status: DC

## 2015-02-28 MED ORDER — LACTATED RINGERS IV SOLN
INTRAVENOUS | Status: DC | PRN
  Administered 2015-02-28 (×3): via INTRAVENOUS

## 2015-02-28 MED ORDER — PHENYLEPHRINE 8 MG IN D5W 100 ML (0.08MG/ML) PREMIX OPTIME
INJECTION | INTRAVENOUS | Status: DC | PRN
  Administered 2015-02-28: 60 ug/min via INTRAVENOUS

## 2015-02-28 MED ORDER — ONDANSETRON HCL 4 MG/2ML IJ SOLN
4.0000 mg | Freq: Four times a day (QID) | INTRAMUSCULAR | Status: DC | PRN
  Administered 2015-02-28: 4 mg via INTRAVENOUS

## 2015-02-28 MED ORDER — FENTANYL CITRATE (PF) 100 MCG/2ML IJ SOLN
INTRAMUSCULAR | Status: AC
  Filled 2015-02-28: qty 2

## 2015-02-28 MED ORDER — MORPHINE SULFATE (PF) 0.5 MG/ML IJ SOLN
INTRAMUSCULAR | Status: DC | PRN
  Administered 2015-02-28: .2 mg via INTRATHECAL

## 2015-02-28 MED ORDER — SIMETHICONE 80 MG PO CHEW: 80.0000 mg | CHEWABLE_TABLET | ORAL | Status: DC | PRN

## 2015-02-28 MED ORDER — SIMETHICONE 80 MG PO CHEW
80.0000 mg | CHEWABLE_TABLET | ORAL | Status: DC
  Administered 2015-03-01 (×2): 80 mg via ORAL
  Filled 2015-02-28 (×2): qty 1

## 2015-02-28 MED ORDER — BUPRENORPHINE HCL 8 MG SL SUBL: 12.0000 mg | SUBLINGUAL_TABLET | Freq: Every day | SUBLINGUAL | Status: DC

## 2015-02-28 MED ORDER — KETOROLAC TROMETHAMINE 30 MG/ML IJ SOLN
INTRAMUSCULAR | Status: AC
  Filled 2015-02-28: qty 1

## 2015-02-28 MED ORDER — OXYCODONE-ACETAMINOPHEN 5-325 MG PO TABS: 1.0000 | ORAL_TABLET | ORAL | Status: DC | PRN

## 2015-02-28 MED ORDER — PHENYLEPHRINE 8 MG IN D5W 100 ML (0.08MG/ML) PREMIX OPTIME
INJECTION | INTRAVENOUS | Status: AC
  Filled 2015-02-28: qty 100

## 2015-02-28 MED ORDER — EPHEDRINE SULFATE 50 MG/ML IJ SOLN
INTRAMUSCULAR | Status: DC | PRN
  Administered 2015-02-28 (×2): 10 mg via INTRAVENOUS

## 2015-02-28 MED ORDER — ZOLPIDEM TARTRATE 5 MG PO TABS: 5.0000 mg | ORAL_TABLET | Freq: Every evening | ORAL | Status: DC | PRN

## 2015-02-28 MED ORDER — SENNOSIDES-DOCUSATE SODIUM 8.6-50 MG PO TABS
2.0000 | ORAL_TABLET | ORAL | Status: DC
  Administered 2015-03-01 (×2): 2 via ORAL
  Filled 2015-02-28 (×2): qty 2

## 2015-02-28 MED ORDER — OXYTOCIN 10 UNIT/ML IJ SOLN
40.0000 [IU] | INTRAVENOUS | Status: DC | PRN
  Administered 2015-02-28: 40 [IU] via INTRAVENOUS

## 2015-02-28 MED ORDER — OXYTOCIN 10 UNIT/ML IJ SOLN: 2.5000 [IU]/h | INTRAVENOUS | Status: AC

## 2015-02-28 MED ORDER — PRENATAL MULTIVITAMIN CH
1.0000 | ORAL_TABLET | Freq: Every day | ORAL | Status: DC
  Administered 2015-03-01 – 2015-03-02 (×2): 1 via ORAL
  Filled 2015-02-28 (×2): qty 1

## 2015-02-28 MED ORDER — BUPIVACAINE IN DEXTROSE 0.75-8.25 % IT SOLN
INTRATHECAL | Status: DC | PRN
  Administered 2015-02-28: 1.6 mL via INTRATHECAL

## 2015-02-28 MED ORDER — ACETAMINOPHEN 325 MG PO TABS: 650.0000 mg | ORAL_TABLET | ORAL | Status: DC | PRN

## 2015-02-28 MED ORDER — KETOROLAC TROMETHAMINE 30 MG/ML IJ SOLN: 30.0000 mg | Freq: Four times a day (QID) | INTRAMUSCULAR | Status: AC | PRN

## 2015-02-28 MED ORDER — LANOLIN HYDROUS EX OINT: 1.0000 "application " | TOPICAL_OINTMENT | CUTANEOUS | Status: DC | PRN

## 2015-02-28 MED ORDER — ONDANSETRON HCL 4 MG/2ML IJ SOLN
INTRAMUSCULAR | Status: AC
  Filled 2015-02-28: qty 2

## 2015-02-28 MED ORDER — DEXAMETHASONE SODIUM PHOSPHATE 10 MG/ML IJ SOLN
INTRAMUSCULAR | Status: DC | PRN
  Administered 2015-02-28: 5 mg via INTRAVENOUS

## 2015-02-28 MED ORDER — KETOROLAC TROMETHAMINE 30 MG/ML IJ SOLN
30.0000 mg | Freq: Four times a day (QID) | INTRAMUSCULAR | Status: AC | PRN
  Administered 2015-02-28: 30 mg via INTRAVENOUS

## 2015-02-28 MED ORDER — LIDOCAINE HCL (PF) 1 % IJ SOLN: 30.0000 mL | INTRAMUSCULAR | Status: DC | PRN

## 2015-02-28 MED ORDER — WITCH HAZEL-GLYCERIN EX PADS
1.0000 | MEDICATED_PAD | CUTANEOUS | Status: DC | PRN
Start: 2015-02-28 — End: 2015-03-02

## 2015-02-28 MED ORDER — DIPHENHYDRAMINE HCL 25 MG PO CAPS: 25.0000 mg | ORAL_CAPSULE | Freq: Four times a day (QID) | ORAL | Status: DC | PRN

## 2015-02-28 MED ORDER — DIBUCAINE 1 % RE OINT: 1.0000 "application " | TOPICAL_OINTMENT | RECTAL | Status: DC | PRN

## 2015-02-28 MED ORDER — NALOXONE HCL 0.4 MG/ML IJ SOLN: 0.4000 mg | INTRAMUSCULAR | Status: DC | PRN

## 2015-02-28 MED ORDER — MENTHOL 3 MG MT LOZG: 1.0000 | LOZENGE | OROMUCOSAL | Status: DC | PRN

## 2015-02-28 MED ORDER — CEFAZOLIN SODIUM-DEXTROSE 2-3 GM-% IV SOLR
2.0000 g | Freq: Once | INTRAVENOUS | Status: AC
  Administered 2015-02-28: 2 g via INTRAVENOUS

## 2015-02-28 MED ORDER — OXYTOCIN 10 UNIT/ML IJ SOLN
INTRAMUSCULAR | Status: AC
  Filled 2015-02-28: qty 4

## 2015-02-28 MED ORDER — OXYCODONE-ACETAMINOPHEN 5-325 MG PO TABS: 2.0000 | ORAL_TABLET | ORAL | Status: DC | PRN

## 2015-02-28 MED ORDER — NALBUPHINE HCL 10 MG/ML IJ SOLN: 5.0000 mg | INTRAMUSCULAR | Status: DC | PRN

## 2015-02-28 MED ORDER — MORPHINE SULFATE (PF) 0.5 MG/ML IJ SOLN
INTRAMUSCULAR | Status: AC
  Filled 2015-02-28: qty 10

## 2015-02-28 MED ORDER — DIPHENHYDRAMINE HCL 25 MG PO CAPS
25.0000 mg | ORAL_CAPSULE | ORAL | Status: DC | PRN
  Filled 2015-02-28: qty 1

## 2015-02-28 SURGICAL SUPPLY — 35 items
BENZOIN TINCTURE PRP APPL 2/3 (GAUZE/BANDAGES/DRESSINGS) ×3 IMPLANT
CLAMP CORD UMBIL (MISCELLANEOUS) IMPLANT
CLOSURE STERI STRIP 1/2 X4 (GAUZE/BANDAGES/DRESSINGS) ×3 IMPLANT
CLOTH BEACON ORANGE TIMEOUT ST (SAFETY) ×3 IMPLANT
DRAPE SHEET LG 3/4 BI-LAMINATE (DRAPES) IMPLANT
DRSG OPSITE POSTOP 4X10 (GAUZE/BANDAGES/DRESSINGS) ×3 IMPLANT
DURAPREP 26ML APPLICATOR (WOUND CARE) ×3 IMPLANT
ELECT REM PT RETURN 9FT ADLT (ELECTROSURGICAL) ×3
ELECTRODE REM PT RTRN 9FT ADLT (ELECTROSURGICAL) ×1 IMPLANT
EXTRACTOR VACUUM KIWI (MISCELLANEOUS) IMPLANT
EXTRACTOR VACUUM M CUP 4 TUBE (SUCTIONS) IMPLANT
EXTRACTOR VACUUM M CUP 4' TUBE (SUCTIONS)
GLOVE BIO SURGEON STRL SZ 6 (GLOVE) ×3 IMPLANT
GLOVE BIOGEL PI IND STRL 6 (GLOVE) ×2 IMPLANT
GLOVE BIOGEL PI IND STRL 7.0 (GLOVE) ×1 IMPLANT
GLOVE BIOGEL PI INDICATOR 6 (GLOVE) ×4
GLOVE BIOGEL PI INDICATOR 7.0 (GLOVE) ×2
GOWN STRL REUS W/TWL LRG LVL3 (GOWN DISPOSABLE) ×6 IMPLANT
KIT ABG SYR 3ML LUER SLIP (SYRINGE) ×3 IMPLANT
LIQUID BAND (GAUZE/BANDAGES/DRESSINGS) IMPLANT
NEEDLE HYPO 25X5/8 SAFETYGLIDE (NEEDLE) ×3 IMPLANT
NS IRRIG 1000ML POUR BTL (IV SOLUTION) ×3 IMPLANT
PACK C SECTION WH (CUSTOM PROCEDURE TRAY) ×3 IMPLANT
PAD ABD 7.5X8 STRL (GAUZE/BANDAGES/DRESSINGS) ×3 IMPLANT
PAD OB MATERNITY 4.3X12.25 (PERSONAL CARE ITEMS) ×3 IMPLANT
PENCIL SMOKE EVAC W/HOLSTER (ELECTROSURGICAL) ×3 IMPLANT
SPONGE GAUZE 4X4 12PLY STER LF (GAUZE/BANDAGES/DRESSINGS) ×6 IMPLANT
SUT CHROMIC 0 CTX 36 (SUTURE) ×9 IMPLANT
SUT MON AB 2-0 CT1 27 (SUTURE) ×3 IMPLANT
SUT PDS AB 0 CT1 27 (SUTURE) IMPLANT
SUT PLAIN 0 NONE (SUTURE) IMPLANT
SUT VIC AB 0 CT1 36 (SUTURE) IMPLANT
SUT VIC AB 4-0 KS 27 (SUTURE) IMPLANT
TOWEL OR 17X24 6PK STRL BLUE (TOWEL DISPOSABLE) ×3 IMPLANT
TRAY FOLEY CATH SILVER 14FR (SET/KITS/TRAYS/PACK) IMPLANT

## 2015-02-28 NOTE — Anesthesia Postprocedure Evaluation (Signed)
Anesthesia Post Note  Patient: [name]  Procedure(s) Performed: Procedure(s) (LRB): CESAREAN SECTION (N/A)  Patient location during evaluation: Mother Baby Anesthesia Type: Spinal Level of consciousness: awake Pain management: pain level controlled Vital Signs Assessment: post-procedure vital signs reviewed and stable Respiratory status: spontaneous breathing Cardiovascular status: stable Postop Assessment: no headache, no backache, spinal receding, patient able to bend at knees, no signs of nausea or vomiting and adequate PO intake Anesthetic complications: no    Last Vitals:  Filed Vitals:   02/28/15 1530 02/28/15 1622  BP: 109/58 105/61  Pulse: 54 63  Temp: 36.6 C 36.4 C  Resp: 16 16    Last Pain:  Filed Vitals:   02/28/15 1624  PainSc: 3                  Henri Guedes

## 2015-02-28 NOTE — Anesthesia Postprocedure Evaluation (Signed)
Anesthesia Post Note  Patient: [name]  Procedure(s) Performed: Procedure(s) (LRB): CESAREAN SECTION (N/A)  Patient location during evaluation: PACU Anesthesia Type: Spinal Level of consciousness: oriented and awake and alert Pain management: pain level controlled Vital Signs Assessment: post-procedure vital signs reviewed and stable Respiratory status: spontaneous breathing, respiratory function stable and patient connected to nasal cannula oxygen Cardiovascular status: blood pressure returned to baseline and stable Postop Assessment: no headache, no backache, spinal receding, patient able to bend at knees and no signs of nausea or vomiting Anesthetic complications: no    Last Vitals:  Filed Vitals:   02/28/15 1252 02/28/15 1300  BP: 96/66 108/66  Pulse: 63 60  Temp:    Resp: 15 14    Last Pain:  Filed Vitals:   02/28/15 1302  PainSc: 6                  Catalina Gravel

## 2015-02-28 NOTE — Op Note (Signed)
[name] PROCEDURE DATE: 02/28/2015  PREOPERATIVE DIAGNOSIS: Intrauterine pregnancy at  [redacted]w[redacted]d weeks gestation, IUGR at <5%, active vulvar HSV   POSTOPERATIVE DIAGNOSIS: The same  PROCEDURE:  Primary Low Transverse Cesarean Section  SURGEON:  Dr. Linda Hedges  INDICATIONS: [name] is a 40 y.o. G1P1001 at [redacted]w[redacted]d scheduled for cesarean section secondary to IUGR at <5% and active HSV outbreak.  The risks of cesarean section discussed with the patient included but were not limited to: bleeding which may require transfusion or reoperation; infection which may require antibiotics; injury to bowel, bladder, ureters or other surrounding organs; injury to the fetus; need for additional procedures including hysterectomy in the event of a life-threatening hemorrhage; placental abnormalities wth subsequent pregnancies, incisional problems, thromboembolic phenomenon and other postoperative/anesthesia complications. The patient concurred with the proposed plan, giving informed written consent for the procedure.    FINDINGS:  Viable female infant in cephalic presentation, APGARs 9,9:  Weight pending  Clear amniotic fluid.  Intact placenta, three vessel cord.  Grossly normal uterus, ovaries and fallopian tubes. .   ANESTHESIA:   Spinal ESTIMATED BLOOD LOSS: 500 ml SPECIMENS: Placenta sent to pathology COMPLICATIONS: None immediate  PROCEDURE IN DETAIL:  The patient received intravenous antibiotics and had sequential compression devices applied to her lower extremities while in the preoperative area.  She was then taken to the operating room where spinal anesthesia was administered and was found to be adequate. She was then placed in a dorsal supine position with a leftward tilt, and prepped and draped in a sterile manner.  A foley catheter was placed into her bladder and attached to constant gravity.  After an adequate timeout was performed, a Pfannenstiel skin incision was made with scalpel and  carried through to the underlying layer of fascia. The fascia was incised in the midline and this incision was extended bilaterally using the Mayo scissors. Kocher clamps were applied to the superior aspect of the fascial incision and the underlying rectus muscles were dissected off bluntly. A similar process was carried out on the inferior aspect of the facial incision. The rectus muscles were separated in the midline bluntly and the peritoneum was entered bluntly.  Bladder flap was created sharply and developed bluntly.  Bladder was protected behind the bladder blade.  A transverse hysterotomy was made with a scalpel and extended bilaterally bluntly. The bladder blade was then removed. The infant was successfully delivered, and cord was clamped and cut and infant was handed over to awaiting neonatology team. Uterine massage was then administered and the placenta delivered intact with three-vessel cord. The uterus was cleared of clot and debris.  The hysterotomy was closed with 0 Chromic.  A second imbricating suture of 0-Chromic was used to reinforce the incision and aid in hemostasis.  The peritoneum and rectus muscles were noted to be hemostatic.  The peritoneum was reapproximated using 3-0 monocryl.  The fascia was closed with 0-Vicryl in a running fashion with good restoration of anatomy.  The subcutaneus tissue was copiously irrigated.  The skin was closed with 4-0 Vicryl in a subcuticular fashion.  Pt tolerated the procedure will.  All counts were correct x2.  Pt went to the recovery room in stable condition.

## 2015-02-28 NOTE — H&P (Signed)
[name] is a 40 y.o. female presenting for IOL secondary to IUGR <5%.  Last u/s on 02/26/15 efw 5#0oz (4%), BPP 8/8.  Antepartum course complicated by AMA; nl Panorama.  H/O back surgery 05/2014.  Patient was a smoker and quit 4 months ago.  She takes subutex 8-12 mg daily or opioid dependence.  GBS negative.  Maternal Medical History:  Fetal activity: Perceived fetal activity is normal.   Last perceived fetal movement was within the past hour.    Prenatal complications: IUGR and substance abuse.   Prenatal Complications - Diabetes: none.    OB History    Gravida Para Term Preterm AB TAB SAB Ectopic Multiple Living   1              Past Medical History  Diagnosis Date  . Substance abuse   . Numbness     right leg  . Arthritis   . Chronic back pain   . Depression     no meds   . AMA (advanced maternal age) multigravida 17+   . Hx of eating disorder    Past Surgical History  Procedure Laterality Date  . Bunionectomy Bilateral     screws  . Cholecystectomy    . Diagnostic laparoscopy    . Lumbar fusion     Family History: family history is not on file. Social History:  reports that she has quit smoking. She does not have any smokeless tobacco history on file. She reports that she does not drink alcohol or use illicit drugs.   Prenatal Transfer Tool  Maternal Diabetes: No Genetic Screening: Normal Maternal Ultrasounds/Referrals: Normal Fetal Ultrasounds or other Referrals:  None Maternal Substance Abuse:  Yes:  Type: Smoker, Other: subutex Significant Maternal Medications:  Meds include: Other: subutex Significant Maternal Lab Results:  Lab values include: Group B Strep negative Other Comments:  None  ROS    Blood pressure 109/74, pulse 77, resp. rate 16, height 5\' 4"  (1.626 m), weight 72.576 kg (160 lb), last menstrual period 06/09/2014. Maternal Exam:  Abdomen: Patient reports no abdominal tenderness. Fundal height is s<d.   Estimated fetal weight is 5#.    Fetal presentation: vertex     Physical Exam  Constitutional: She is oriented to person, place, and time. She appears well-developed and well-nourished.  GI: Soft. There is no rebound and no guarding.  Neurological: She is alert and oriented to person, place, and time.  Skin: Skin is warm and dry.  Psychiatric: She has a normal mood and affect. Her behavior is normal.    Prenatal labs: ABO, Rh: B/Positive/-- (06/28 0000) Antibody: Negative (06/28 0000) Rubella: Immune (06/28 0000) RPR: Nonreactive (06/28 0000)  HBsAg: Negative (06/28 0000)  HIV: Non-reactive (06/28 0000)  GBS: Negative (01/09 0000)   Assessment/Plan: 40yo [id number] at [redacted]w[redacted]d with IUGR -AROM -Anticipate NSVD   Mieko Kneebone 02/28/2015, 8:51 AM

## 2015-02-28 NOTE — Anesthesia Preprocedure Evaluation (Addendum)
Anesthesia Evaluation  Patient identified by MRN, date of birth, ID band Patient awake    Reviewed: Allergy & Precautions, NPO status , Patient's Chart, lab work & pertinent test results  Airway Mallampati: II  TM Distance: >3 FB Neck ROM: full    Dental  (+) Teeth Intact, Dental Advisory Given   Pulmonary former smoker,    Pulmonary exam normal breath sounds clear to auscultation       Cardiovascular Exercise Tolerance: Good negative cardio ROS Normal cardiovascular exam Rhythm:regular Rate:Normal     Neuro/Psych PSYCHIATRIC DISORDERS Depression Intermittent right LE radiculopathy per patient    GI/Hepatic negative GI ROS, (+)     substance abuse  ,   Endo/Other  negative endocrine ROS  Renal/GU negative Renal ROS     Musculoskeletal  (+) Arthritis ,   Abdominal   Peds  Hematology negative hematology ROS (+) Plt 240k   Anesthesia Other Findings Day of surgery medications reviewed with the patient.  Reproductive/Obstetrics                            Anesthesia Physical Anesthesia Plan  ASA: II  Anesthesia Plan: Spinal   Post-op Pain Management:    Induction:   Airway Management Planned:   Additional Equipment:   Intra-op Plan:   Post-operative Plan:   Informed Consent: I have reviewed the patients History and Physical, chart, labs and discussed the procedure including the risks, benefits and alternatives for the proposed anesthesia with the patient or authorized representative who has indicated his/her understanding and acceptance.   Dental advisory given  Plan Discussed with: CRNA, Anesthesiologist and Surgeon  Anesthesia Plan Comments: (Discussed risks and benefits of and differences between spinal and general. Discussed risks of spinal including headache, backache, failure, bleeding, infection, and nerve damage. Patient consents to spinal. Questions answered.  Coagulation studies and platelet count acceptable.  **Patient last ate at 0600.  Discussed with Dr. Lynnette Caffey who states case should proceed due to ruptured membranes and active HSV.**)       Anesthesia Quick Evaluation

## 2015-02-28 NOTE — Addendum Note (Signed)
Addendum  created 02/28/15 1640 by Ignacia Bayley, CRNA   Modules edited: Clinical Notes   Clinical Notes:  File: [id number]

## 2015-02-28 NOTE — Transfer of Care (Signed)
Immediate Anesthesia Transfer of Care Note  Patient: [name]  Procedure(s) Performed: Procedure(s): CESAREAN SECTION (N/A)  Patient Location: PACU  Anesthesia Type:Spinal  Level of Consciousness: awake, alert  and oriented  Airway & Oxygen Therapy: Patient Spontanous Breathing  Post-op Assessment: Report given to RN and Post -op Vital signs reviewed and stable  Post vital signs: Reviewed and stable  Last Vitals:  Filed Vitals:   02/28/15 0828 02/28/15 0949  BP: 109/74 120/69  Pulse: 77 69  Temp:  36.7 C  Resp:  18    Complications: No apparent anesthesia complications

## 2015-02-28 NOTE — Progress Notes (Addendum)
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  She took HSV ppx last pregnancy.  She reports no h/o testing for HSV but describes classic primary HSV infection years ago.    Office records for this pregnancy do not document this hx and the patient has not been on HSV ppx.    AROM was performed with clear fluid prior to learning about this history.  On questioning, the patient reports burning with urination rectally as well as burning rectally with BMs.  Exam does reveal a lesion on the perineum/perianal area which is ulcerated and suspicious for HSV.    The patient is counseled for C/S including risk of bleeding, infection, scarring and damage to surrounding structures.  She understands the 1% risk of uterine rupture in subsequent pregnancies.  All questions were answered and the patient wishes to proceed.  Linda Hedges, DO

## 2015-02-28 NOTE — Anesthesia Procedure Notes (Signed)
Spinal Patient location during procedure: OR Staffing Anesthesiologist: Catalina Gravel Performed by: anesthesiologist  Preanesthetic Checklist Completed: patient identified, surgical consent, pre-op evaluation, timeout performed, IV checked, risks and benefits discussed and monitors and equipment checked Spinal Block Patient position: sitting Prep: site prepped and draped and DuraPrep Patient monitoring: continuous pulse ox and blood pressure Approach: midline Location: L3-4 Needle Needle type: Pencan  Needle gauge: 24 G Assessment Sensory level: T4 Additional Notes Functioning IV was confirmed and monitors were applied. Sterile prep and drape, including hand hygiene, mask and sterile gloves were used. The patient was positioned and the spine was prepped. The skin was anesthetized with lidocaine.  Free flow of clear CSF was obtained prior to injecting local anesthetic into the CSF.  The spinal needle aspirated freely following injection.  The needle was carefully withdrawn.  The patient tolerated the procedure well. Consent was obtained prior to procedure with all questions answered and concerns addressed. Risks including but not limited to bleeding, infection, nerve damage, paralysis, failed block, inadequate analgesia, allergic reaction, high spinal, itching and headache were discussed and the patient wished to proceed.   Hoy Morn, MD

## 2015-03-01 ENCOUNTER — Encounter (HOSPITAL_COMMUNITY): Payer: Self-pay | Admitting: Obstetrics & Gynecology

## 2015-03-01 LAB — CBC
HEMATOCRIT: 32.1 % — AB (ref 36.0–46.0)
Hemoglobin: 11.1 g/dL — ABNORMAL LOW (ref 12.0–15.0)
MCH: 30.2 pg (ref 26.0–34.0)
MCHC: 34.6 g/dL (ref 30.0–36.0)
MCV: 87.2 fL (ref 78.0–100.0)
PLATELETS: 230 10*3/uL (ref 150–400)
RBC: 3.68 MIL/uL — ABNORMAL LOW (ref 3.87–5.11)
RDW: 13.9 % (ref 11.5–15.5)
WBC: 21.3 10*3/uL — AB (ref 4.0–10.5)

## 2015-03-01 LAB — RPR: RPR: NONREACTIVE

## 2015-03-01 MED ORDER — PNEUMOCOCCAL VAC POLYVALENT 25 MCG/0.5ML IJ INJ
0.5000 mL | INJECTION | INTRAMUSCULAR | Status: AC
  Administered 2015-03-02: 0.5 mL via INTRAMUSCULAR
  Filled 2015-03-01: qty 0.5

## 2015-03-01 MED ORDER — VALACYCLOVIR HCL 500 MG PO TABS
500.0000 mg | ORAL_TABLET | Freq: Two times a day (BID) | ORAL | Status: DC
  Administered 2015-03-01 – 2015-03-02 (×3): 500 mg via ORAL
  Filled 2015-03-01 (×3): qty 1

## 2015-03-01 NOTE — Lactation Note (Signed)
This note was copied from the chart of [employer]. Lactation Consultation Note  Observed baby latched well to breast.  Reviewed breast massage and waking techniques.  Instructed to continue to post pump and supplement with expressed milk/formula.  Encouraged to call with questions/assist prn.  Patient Name: [name] [id number] Date: 03/01/2015 Reason for consult: Follow-up assessment;Infant < 6lbs   Maternal Data Formula Feeding for Exclusion: Yes Reason for exclusion: Mother's choice to formula and breast feed on admission Does the patient have breastfeeding experience prior to this delivery?: No  Feeding Feeding Type: Breast Fed Nipple Type: Slow - flow  LATCH Score/Interventions Latch: Grasps breast easily, tongue down, lips flanged, rhythmical sucking.  Audible Swallowing: Spontaneous and intermittent  Type of Nipple: Everted at rest and after stimulation  Comfort (Breast/Nipple): Soft / non-tender     Hold (Positioning): No assistance needed to correctly position infant at breast.  LATCH Score: 10  Lactation Tools Discussed/Used     Consult Status Consult Status: Follow-up Date: 03/02/15 Follow-up type: In-patient    Ave Filter 03/01/2015, 3:32 PM

## 2015-03-01 NOTE — Lactation Note (Signed)
This note was copied from the chart of [name]. Lactation Consultation Note; Mom very tired, states she was told not to breastfeed because he was burning up so many calories- just to pump and bottle feed EBM / formula. Mom reports she pumped 20 ml at last pumping. Suggested putting the baby to the breast no more than 20-30 min sometimes so he continues to learn breast feeding. Mom states it is hurting when he latches and when she pumps. Reports it is mostly tugging but she is not used to it. Concerned about milk supply- encouragement given that she obtained 20 cc's last pumping. To call for assist prn  Patient Name: [name] [id number] Date: 03/01/2015 Reason for consult: Follow-up assessment;Infant < 6lbs   Maternal Data Formula Feeding for Exclusion: Yes Reason for exclusion: Mother's choice to formula and breast feed on admission Does the patient have breastfeeding experience prior to this delivery?: No  Feeding Feeding Type: Breast Milk with Formula added Nipple Type: Slow - flow  LATCH Score/Interventions                      Lactation Tools Discussed/Used     Consult Status Consult Status: Follow-up Date: 03/02/15 Follow-up type: In-patient    Truddie Crumble 03/01/2015, 2:33 PM

## 2015-03-01 NOTE — Progress Notes (Signed)
Subjective: Postpartum Day 1: Cesarean Delivery Patient reports tolerating PO, + flatus and no problems voiding.    Objective: Vital signs in last 24 hours: Temp:  [97.6 F (36.4 C)-98.4 F (36.9 C)] 97.9 F (36.6 C) (01/23 0755) Pulse Rate:  [54-93] 71 (01/23 0755) Resp:  [12-20] 20 (01/23 0755) BP: (96-120)/(53-74) 106/68 mmHg (01/23 0755) SpO2:  [94 %-99 %] 98 % (01/23 0755)  Physical Exam:  General: alert and cooperative Lochia: appropriate Uterine Fundus: firm Incision: healing well, abd pressure dressing removed and honeycomb dressing replaced. No active bleeding observed DVT Evaluation: No evidence of DVT seen on physical exam. Negative Homan's sign. No cords or calf tenderness. No significant calf/ankle edema.   Recent Labs  02/28/15 0735 03/01/15 0540  HGB 12.2 11.1*  HCT 35.1* 32.1*    Assessment/Plan: Status post Cesarean section. Doing well postoperatively.  Continue current care.  Ahsley Attwood G 03/01/2015, 8:26 AM

## 2015-03-01 NOTE — Lactation Note (Signed)
This note was copied from the chart of [name]. Lactation Consultation Note 37 5/7 wk. Baby weights 4.15 lbs. Baby is BF 30-40 min. Long. Encouraged not to BF over 20-25 min. Then supplement w/formula d/t SGA. Not to have feeding total time longer than 30 min. Mom didn't BF her other child. States BF going well. Mom shown how to use DEBP & how to disassemble, clean, & reassemble parts.Mom knows to pump q3h for 15-20 min. Mom encouraged to feed baby 8-12 times/24 hours and with feeding cues. Referred to Baby and Me Book in Breastfeeding section Pg. 22-23 for position options and Proper latch demonstration. Educated about LPI newborn behavior, strict I&O, STS, breast stimulation, supplementing. LPI information sheet given and amount of supplementing formula. Gave slow flow nipples and 22 cal. Similac. [place] brochure given w/resources, support groups and [place] services. Patient Name: [name] [id number] Date: 03/01/2015 Reason for consult: Initial assessment   Maternal Data Does the patient have breastfeeding experience prior to this delivery?: No  Feeding Feeding Type: Breast Fed Length of feed: 40 min  LATCH Score/Interventions Latch: Repeated attempts needed to sustain latch, nipple held in mouth throughout feeding, stimulation needed to elicit sucking reflex. Intervention(s): Skin to skin  Audible Swallowing: A few with stimulation Intervention(s): Skin to skin Intervention(s): Skin to skin  Type of Nipple: Everted at rest and after stimulation  Comfort (Breast/Nipple): Soft / non-tender     Hold (Positioning): No assistance needed to correctly position infant at breast. Intervention(s): Breastfeeding basics reviewed;Support Pillows;Position options;Skin to skin  LATCH Score: 8  Lactation Tools Discussed/Used Tools: Pump Breast pump type: Double-Electric Breast Pump WIC Program: No Pump Review: Setup, frequency, and cleaning;Milk Storage Initiated by:: Allayne Stack  RN Date initiated:: 03/01/15   Consult Status Consult Status: Follow-up Date: 03/01/15 Follow-up type: In-patient    Theodoro Kalata 03/01/2015, 6:49 AM

## 2015-03-02 ENCOUNTER — Ambulatory Visit: Payer: Self-pay

## 2015-03-02 MED ORDER — OXYCODONE-ACETAMINOPHEN 5-325 MG PO TABS: 1.0000 | ORAL_TABLET | ORAL | Status: DC | PRN

## 2015-03-02 MED ORDER — IBUPROFEN 600 MG PO TABS: 600.0000 mg | ORAL_TABLET | Freq: Four times a day (QID) | ORAL | Status: DC
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
## 2015-03-02 NOTE — Lactation Note (Signed)
This note was copied from the chart of [place]. Lactation Consultation Note  Patient Name: [name] [id number] Date: 03/02/2015 Reason for consult: Follow-up assessment Baby at 53 hr of life and RN reported that mom was engorged. Mom is offering bottles and the breast. After this last bf her breast felt better. Encouraged her to latch baby on demand 8+/24hr, f/u with formula in small amounts after latching for at least 10 minutes each side, and pump as needed if baby is going long stretches without eating. If mom is pumping when full and no milk is not coming out try icing the breast for 15 minutes along with breast massage then pump again. Discussed mastitis prevention and treatment. Mom is aware of OP services and support group. She will call as needed.   Maternal Data    Feeding Feeding Type: Bottle Fed - Formula  LATCH Score/Interventions                      Lactation Tools Discussed/Used     Consult Status Consult Status: Follow-up Date: 03/03/15 Follow-up type: In-patient    Denzil Hughes 03/02/2015, 10:40 PM

## 2015-03-02 NOTE — Lactation Note (Signed)
This note was copied from the chart of [place]. Lactation Consultation Note  Mom states baby is not latching as well.  She is pumping but not obtaining milk.  Baby is bottle feeding 20-25 mls of formula.  Encouraged to continue to attempt breast and call for assist prn.  Patient Name: [name] [id number] Date: 03/02/2015     Maternal Data    Feeding Feeding Type: Bottle Fed - Formula Nipple Type: Slow - flow Length of feed: 20 min  LATCH Score/Interventions Latch: Grasps breast easily, tongue down, lips flanged, rhythmical sucking.  Audible Swallowing: A few with stimulation Intervention(s): Hand expression  Type of Nipple: Everted at rest and after stimulation  Comfort (Breast/Nipple): Soft / non-tender     Hold (Positioning): No assistance needed to correctly position infant at breast.  LATCH Score: 9  Lactation Tools Discussed/Used     Consult Status      Ave Filter 03/02/2015, 3:47 PM

## 2015-03-02 NOTE — Progress Notes (Signed)
Subjective: Postpartum Day 2: Cesarean Delivery Patient reports tolerating PO, + flatus and no problems voiding.    Objective: Vital signs in last 24 hours: Temp:  [97.4 F (36.3 C)] 97.4 F (36.3 C) (01/23 1800) Pulse Rate:  [82] 82 (01/23 1658) Resp:  [18] 18 (01/23 1658) BP: (123)/(77) 123/77 mmHg (01/23 1658) SpO2:  [100 %] 100 % (01/23 1658)  Physical Exam:  General: alert and cooperative Lochia: appropriate Uterine Fundus: firm Incision: healing well DVT Evaluation: No evidence of DVT seen on physical exam. Negative Homan's sign. No cords or calf tenderness. No significant calf/ankle edema.   Recent Labs  02/28/15 0735 03/01/15 0540  HGB 12.2 11.1*  HCT 35.1* 32.1*    Assessment/Plan: Status post Cesarean section. Doing well postoperatively.  Discharge home with standard precautions and return to clinic in 1-2 weeks.  Eisa Conaway G 03/02/2015, 8:16 AM

## 2015-03-02 NOTE — Lactation Note (Signed)
This note was copied from the chart of [place]. Lactation Consultation Note Out of room. Fob stated she stepped out for a few minutes. Mom is BF and then supplementing. Noted improvement in out put.  Patient Name: [name] [id number] Date: 03/02/2015     Maternal Data    Feeding Feeding Type: Bottle Fed - Formula Length of feed: 20 min  LATCH Score/Interventions                      Lactation Tools Discussed/Used     Consult Status      Theodoro Kalata 03/02/2015, 6:43 AM

## 2015-03-02 NOTE — Discharge Summary (Signed)
Obstetric Discharge Summary Reason for Admission: induction of labor Prenatal Procedures: ultrasound Intrapartum Procedures: cesarean: low cervical, transverse Postpartum Procedures: none Complications-Operative and Postpartum: none HEMOGLOBIN  Date Value Ref Range Status  03/01/2015 11.1* 12.0 - 15.0 g/dL Final   HCT  Date Value Ref Range Status  03/01/2015 32.1* 36.0 - 46.0 % Final    Physical Exam:  General: alert and cooperative Lochia: appropriate Uterine Fundus: firm Incision: healing well DVT Evaluation: No evidence of DVT seen on physical exam. Negative Homan's sign. No cords or calf tenderness. No significant calf/ankle edema.  Discharge Diagnoses: Term Pregnancy-delivered  Discharge Information: Date: 03/02/2015 Activity: pelvic rest Diet: routine Medications: PNV, Ibuprofen, Percocet and subutex Condition: stable Instructions: refer to practice specific booklet Discharge to: home   Newborn Data: Live born female  Birth Weight: 5 lb 1.1 oz (2300 g) APGAR: 9, 9  Home with mother.  [name] G 03/02/2015, 8:24 AM

## 2015-03-03 ENCOUNTER — Ambulatory Visit: Payer: Self-pay

## 2015-03-03 NOTE — Lactation Note (Signed)
This note was copied from the chart of [place]. Lactation Consultation Note  Patient Name: [name] Preza [id number] Date: 03/03/2015 Reason for consult: Follow-up assessment;Infant < 6lbs Mom reports baby just came off left breast, he is fussy and she is having difficulty getting him to latch again. Assisted Mom with positioning to latch baby onto right breast. Right breast full but not engorged. Few nodules palpable. Baby demonstrated some good suckling bursts, swallows noted. Right breast softened. Mom to post pump both breasts due to some nodules present which are softer but not resolved. Advised to apply ice packs after pumping. Mom has been using ice packs for fullness today. Mom has 15 ml of pumped breast milk and plans to give this back to baby. Advised not to use formula for supplementing now that her milk is in. Stressed importance of using breast milk to empty breasts well to protect milk supply. Mom denies discomfort with baby nursing. Encouraged to call for assist as needed.   Maternal Data    Feeding Feeding Type: Breast Fed Length of feed: 15 min  LATCH Score/Interventions Latch: Grasps breast easily, tongue down, lips flanged, rhythmical sucking.  Audible Swallowing: A few with stimulation  Type of Nipple: Everted at rest and after stimulation  Comfort (Breast/Nipple): Filling, red/small blisters or bruises, mild/mod discomfort  Problem noted: Filling Interventions (Filling): Frequent nursing;Firm support;Massage;Double electric pump  Hold (Positioning): Assistance needed to correctly position infant at breast and maintain latch. Intervention(s): Breastfeeding basics reviewed;Support Pillows;Position options;Skin to skin  LATCH Score: 7  Lactation Tools Discussed/Used Tools: Pump Breast pump type: Double-Electric Breast Pump   Consult Status Consult Status: Follow-up Date: 03/04/15 Follow-up type: In-patient    Katrine Coho 03/03/2015, 3:53  PM

## 2015-03-04 ENCOUNTER — Ambulatory Visit: Payer: Self-pay

## 2015-03-04 NOTE — Lactation Note (Signed)
This note was copied from the chart of [place]. Lactation Consultation Note  Patient Name: [name] [id number] Date: 03/04/2015 Reason for consult: Follow-up assessment Baby at 4 days of life and mom is still reporting breast fullness. She stated that she some times the baby latches other times he does not so she pumps. She stated that if she does not pump every 2 hr her breast get really hard and lumpy. Discussed feeding frequency, pumping, breast changes, and nipple care. She is aware of OP services and support group.    Maternal Data    Feeding Feeding Type: Bottle Fed - Breast Milk Nipple Type: Slow - flow  LATCH Score/Interventions                      Lactation Tools Discussed/Used     Consult Status Consult Status: Follow-up Date: 03/05/15 Follow-up type: In-patient    Denzil Hughes 03/04/2015, 6:54 PM

## 2015-05-20 ENCOUNTER — Other Ambulatory Visit: Payer: Self-pay | Admitting: Orthopedic Surgery

## 2015-05-20 DIAGNOSIS — M532X8 Spinal instabilities, sacral and sacrococcygeal region: Secondary | ICD-10-CM

## 2015-06-03 ENCOUNTER — Other Ambulatory Visit: Payer: 59

## 2015-06-03 ENCOUNTER — Inpatient Hospital Stay: Admission: RE | Admit: 2015-06-03 | Payer: 59 | Source: Ambulatory Visit

## 2015-06-09 ENCOUNTER — Ambulatory Visit
Admission: RE | Admit: 2015-06-09 | Discharge: 2015-06-09 | Disposition: A | Discharge status: Home / Self Care | Payer: 59 | Source: Ambulatory Visit | Attending: Orthopedic Surgery | Admitting: Orthopedic Surgery

## 2015-06-09 DIAGNOSIS — M532X8 Spinal instabilities, sacral and sacrococcygeal region: Secondary | ICD-10-CM

## 2017-02-07 DIAGNOSIS — N911 Secondary amenorrhea: Secondary | ICD-10-CM | POA: Diagnosis not present

## 2017-02-07 DIAGNOSIS — O09521 Supervision of elderly multigravida, first trimester: Secondary | ICD-10-CM | POA: Diagnosis not present

## 2017-02-12 DIAGNOSIS — Z3685 Encounter for antenatal screening for Streptococcus B: Secondary | ICD-10-CM | POA: Diagnosis not present

## 2017-02-12 DIAGNOSIS — G894 Chronic pain syndrome: Secondary | ICD-10-CM | POA: Diagnosis not present

## 2017-02-12 DIAGNOSIS — Z3481 Encounter for supervision of other normal pregnancy, first trimester: Secondary | ICD-10-CM | POA: Diagnosis not present

## 2017-02-15 DIAGNOSIS — Z3401 Encounter for supervision of normal first pregnancy, first trimester: Secondary | ICD-10-CM | POA: Diagnosis not present

## 2017-02-15 DIAGNOSIS — Z3480 Encounter for supervision of other normal pregnancy, unspecified trimester: Secondary | ICD-10-CM | POA: Diagnosis not present

## 2017-02-15 LAB — HM PAP SMEAR: HM Pap smear: NEGATIVE

## 2017-03-13 DIAGNOSIS — Z331 Pregnant state, incidental: Secondary | ICD-10-CM | POA: Diagnosis not present

## 2017-03-13 DIAGNOSIS — G894 Chronic pain syndrome: Secondary | ICD-10-CM | POA: Diagnosis not present

## 2017-03-19 DIAGNOSIS — O09522 Supervision of elderly multigravida, second trimester: Secondary | ICD-10-CM | POA: Diagnosis not present

## 2017-03-19 DIAGNOSIS — Z3A18 18 weeks gestation of pregnancy: Secondary | ICD-10-CM | POA: Diagnosis not present

## 2017-04-06 ENCOUNTER — Encounter (HOSPITAL_COMMUNITY): Payer: Self-pay | Admitting: *Deleted

## 2017-04-06 ENCOUNTER — Other Ambulatory Visit: Payer: Self-pay

## 2017-04-06 ENCOUNTER — Observation Stay (HOSPITAL_COMMUNITY)
Admission: AD | Admit: 2017-04-06 | Discharge: 2017-04-06 | Disposition: A | Discharge status: Home / Self Care | Payer: 59 | Source: Ambulatory Visit | Attending: Obstetrics and Gynecology | Admitting: Obstetrics and Gynecology

## 2017-04-06 DIAGNOSIS — O208 Other hemorrhage in early pregnancy: Principal | ICD-10-CM | POA: Insufficient documentation

## 2017-04-06 DIAGNOSIS — Z3A20 20 weeks gestation of pregnancy: Secondary | ICD-10-CM | POA: Insufficient documentation

## 2017-04-06 DIAGNOSIS — Z79899 Other long term (current) drug therapy: Secondary | ICD-10-CM | POA: Diagnosis not present

## 2017-04-06 DIAGNOSIS — O4432 Partial placenta previa with hemorrhage, second trimester: Secondary | ICD-10-CM | POA: Insufficient documentation

## 2017-04-06 DIAGNOSIS — Z87891 Personal history of nicotine dependence: Secondary | ICD-10-CM | POA: Insufficient documentation

## 2017-04-06 DIAGNOSIS — O4692 Antepartum hemorrhage, unspecified, second trimester: Secondary | ICD-10-CM

## 2017-04-06 DIAGNOSIS — R58 Hemorrhage, not elsewhere classified: Secondary | ICD-10-CM | POA: Diagnosis not present

## 2017-04-06 DIAGNOSIS — N939 Abnormal uterine and vaginal bleeding, unspecified: Secondary | ICD-10-CM | POA: Diagnosis not present

## 2017-04-06 DIAGNOSIS — O4402 Placenta previa specified as without hemorrhage, second trimester: Secondary | ICD-10-CM

## 2017-04-06 LAB — TYPE AND SCREEN
ABO/RH(D): B POS
Antibody Screen: NEGATIVE

## 2017-04-06 MED ORDER — LACTATED RINGERS IV SOLN
INTRAVENOUS | Status: DC
  Administered 2017-04-06: 05:00:00 via INTRAVENOUS

## 2017-04-06 MED ORDER — ACETAMINOPHEN 325 MG PO TABS: 650.0000 mg | ORAL_TABLET | ORAL | Status: DC | PRN

## 2017-04-06 MED ORDER — ZOLPIDEM TARTRATE 5 MG PO TABS: 5.0000 mg | ORAL_TABLET | Freq: Every evening | ORAL | Status: DC | PRN

## 2017-04-06 MED ORDER — CALCIUM CARBONATE ANTACID 500 MG PO CHEW: 2.0000 | CHEWABLE_TABLET | ORAL | Status: DC | PRN

## 2017-04-06 MED ORDER — PRENATAL MULTIVITAMIN CH: 1.0000 | ORAL_TABLET | Freq: Every day | ORAL | Status: DC

## 2017-04-06 MED ORDER — DOCUSATE SODIUM 100 MG PO CAPS
100.0000 mg | ORAL_CAPSULE | Freq: Every day | ORAL | Status: DC
  Administered 2017-04-06: 100 mg via ORAL
  Filled 2017-04-06: qty 1

## 2017-04-06 MED ORDER — BUPRENORPHINE HCL 8 MG SL SUBL
8.0000 mg | SUBLINGUAL_TABLET | Freq: Every day | SUBLINGUAL | Status: DC
  Administered 2017-04-06: 8 mg via SUBLINGUAL
  Filled 2017-04-06: qty 1

## 2017-04-06 NOTE — Progress Notes (Signed)
Called to room by Patient. Patient states, "I really need to go home. I don't have anyone to watch my children. I know that they want to keep me here until tomorrow, but I really need to leave now." Patient made aware of the risks of leaving the hospital too soon. Patient reminded of the reasons why she is here for evaluation and treatment. Patient verbalizes an understanding and still requests to leave the hospital at this time.

## 2017-04-06 NOTE — MAU Note (Signed)
Pt presents to MAU via EMS c/o vaginal bleeding that started aroudn 0330. Pt was diagnosed with placenta previa 2wks ago. EMS stated they evaluated 500cc in the toilet upon arrival. Pt states she has had a clot since she put on her pad about the size of a 50cent piece. Pt report +FM. Denies LOF.

## 2017-04-06 NOTE — MAU Provider Note (Signed)
Chief Complaint:  Vaginal Bleeding   First Provider Initiated Contact with Patient 04/06/17 0446     HPI: [name] is a 42 y.o. G1P1001 at [redacted]w[redacted]d who presents to maternity admissions reporting vaginal bleeding since 3am.  No pain associated with this.  Saw blood on tissue then passed a large amount in toilet.  Had one spot on pad about 3cm   No contractions. She reports good fetal movement, denies LOF, vaginal itching/burning, urinary symptoms, h/a, dizziness, n/v, diarrhea, constipation or fever/chills.    Was diagnosed with placenta previa recently   Has been on pelvic rest.  Has had no bleeding or contractions. History is remarkable for a vaginal delivery followed by a C/S for IUGR. Hoping for a VBAC.  Vaginal Bleeding  The patient's primary symptoms include vaginal bleeding. The patient's pertinent negatives include no genital itching, genital lesions, genital odor or pelvic pain. This is a new problem. The current episode started today. The problem occurs intermittently. The problem has been gradually improving. The patient is experiencing no pain. She is pregnant. Pertinent negatives include no abdominal pain, back pain, chills, dysuria or fever. The vaginal discharge was bloody. The vaginal bleeding is heavier than menses. She has been passing clots. She has not been passing tissue. Nothing aggravates the symptoms. She has tried nothing for the symptoms. She is not sexually active. Her past medical history is significant for a Cesarean section.   RN Note: Pt presents to MAU via EMS c/o vaginal bleeding that started aroudn 0330. Pt was diagnosed with placenta previa 2wks ago. EMS stated they evaluated 500cc in the toilet upon arrival. Pt states she has had a clot since she put on her pad about the size of a 50cent piece. Pt report +FM. Denies LOF    Past Medical History: Past Medical History:  Diagnosis Date  . AMA (advanced maternal age) multigravida 68+   . Arthritis   . Chronic  back pain   . Depression    no meds   . Hx of eating disorder   . Numbness    right leg  . Substance abuse     Past obstetric history: OB History  Gravida Para Term Preterm AB Living  1 1 1     1   SAB TAB Ectopic Multiple Live Births        0 1    # Outcome Date GA Lbr Len/2nd Weight Sex Delivery Anes PTL Lv  1 Term 02/28/15 [redacted]w[redacted]d  5 lb 1.1 oz (2.3 kg) M CS-LTranv Spinal  LIV      Past Surgical History: Past Surgical History:  Procedure Laterality Date  . BUNIONECTOMY Bilateral    screws  . CESAREAN SECTION N/A 02/28/2015   Procedure: CESAREAN SECTION;  Surgeon: Linda Hedges, DO;  Location: [facility];  Service: Obstetrics;  Laterality: N/A;  . CHOLECYSTECTOMY    . DIAGNOSTIC LAPAROSCOPY    . LUMBAR FUSION      Family History: No family history on file.  Social History: Social History   Tobacco Use  . Smoking status: Former Research scientist (life sciences)  . Tobacco comment: quit smoking 3 months  Substance Use Topics  . Alcohol use: No  . Drug use: No    Allergies:  Allergies  Allergen Reactions  . Chantix [Varenicline]     hives    Meds:  Medications Prior to Admission  Medication Sig Dispense Refill Last Dose  . buprenorphine (SUBUTEX) 8 MG SUBL SL tablet Place 8-12 mg under the tongue 2 (  two) times daily as needed (for pain).   0 02/27/2015  . ibuprofen (ADVIL,MOTRIN) 600 MG tablet Take 1 tablet (600 mg total) by mouth every 6 (six) hours. 30 tablet 1   . oxyCODONE-acetaminophen (PERCOCET/ROXICET) 5-325 MG tablet Take 1 tablet by mouth every 4 (four) hours as needed (pain scale 4-7). 30 tablet 0   . Prenatal Vit-Fe Fumarate-FA (MULTIVITAMIN-PRENATAL) 27-0.8 MG TABS tablet Take 2 tablets by mouth daily at 12 noon.   02/27/2015 at Unknown time    I have reviewed patient's Past Medical Hx, Surgical Hx, Family Hx, Social Hx, medications and allergies.   ROS:  Review of Systems  Constitutional: Negative for chills and fever.  Gastrointestinal: Negative for abdominal pain.   Genitourinary: Positive for vaginal bleeding. Negative for dysuria and pelvic pain.  Musculoskeletal: Negative for back pain.   Other systems negative  Physical Exam   Vitals:   04/06/17 0501  BP: (!) 115/92  Pulse: (!) 118  Resp: 20  Temp: 98.1 F (36.7 C)  SpO2: 99%    Constitutional: Well-developed, well-nourished female in no acute distress.  Cardiovascular: normal rate and rhythm Respiratory: normal effort, clear to auscultation bilaterally GI: Abd soft, non-tender, gravid appropriate for gestational age.   No rebound or guarding. MS: Extremities nontender, no edema, normal ROM Neurologic: Alert and oriented x 4.  GU: Neg CVAT.  PELVIC EXAM: Cervix pink, visually closed, without lesion, scant white creamy discharge, vaginal walls and external genitalia normal Bimanual exam: Cervix firm, posterior, neg CMT, uterus nontender, Fundal Height consistent with dates, adnexa without tenderness, enlargement, or mass  FHT:  142  Labs: No results found for this or any previous visit (from the past 24 hour(s)).   Imaging:  No results found.  MAU Course/MDM: I have ordered labs including T/Screen   Consult Dr Helane Rima with presentation, exam findings.  She ordered admission for observation Treatments in MAU included exam.    Assessment: SIUP at [redacted]w[redacted]d  Placenta Previa Bleeding in second trimester  Plan: Admit to High Risk unit MD to follow   Hansel Feinstein CNM, MSN Certified Nurse-Midwife 04/06/2017 4:46 AM

## 2017-04-06 NOTE — Progress Notes (Signed)
Pt without c/o.  No further vb.  No pain or ctx.  + FM  AF, FSS Gen - NAD Abd - gravid, NT  A/P:  Plan for observation overnight

## 2017-04-06 NOTE — Progress Notes (Signed)
Dr. Julien Girt called and notified of Patient's request to leave the hospital at this time. Instructed to have Patient sign [place] Advice form per Dr. Julien Girt.

## 2017-04-06 NOTE — Progress Notes (Signed)
Patient made aware of the risks of leaving the hospital against the advice of the physician and without receiving further care. Patient verbalizes an understanding of the risks. Patient also verbalizes that she accepts all responsibility and liability which may result from her leaving the hospital.

## 2017-04-06 NOTE — Plan of Care (Signed)
Patient resting in bed at this time. MD at bedside. Patient denies any pain, discomfort, or bleeding at this time.

## 2017-04-09 DIAGNOSIS — O4412 Placenta previa with hemorrhage, second trimester: Secondary | ICD-10-CM | POA: Diagnosis not present

## 2017-04-09 DIAGNOSIS — Z3A21 21 weeks gestation of pregnancy: Secondary | ICD-10-CM | POA: Diagnosis not present

## 2017-04-23 DIAGNOSIS — O4412 Placenta previa with hemorrhage, second trimester: Secondary | ICD-10-CM | POA: Diagnosis not present

## 2017-04-23 DIAGNOSIS — Z3A23 23 weeks gestation of pregnancy: Secondary | ICD-10-CM | POA: Diagnosis not present

## 2017-04-25 DIAGNOSIS — G894 Chronic pain syndrome: Secondary | ICD-10-CM | POA: Diagnosis not present

## 2017-04-25 DIAGNOSIS — Z3490 Encounter for supervision of normal pregnancy, unspecified, unspecified trimester: Secondary | ICD-10-CM | POA: Diagnosis not present

## 2017-05-01 DIAGNOSIS — Z3A24 24 weeks gestation of pregnancy: Secondary | ICD-10-CM | POA: Diagnosis not present

## 2017-05-01 DIAGNOSIS — O4412 Placenta previa with hemorrhage, second trimester: Secondary | ICD-10-CM | POA: Diagnosis not present

## 2017-05-21 DIAGNOSIS — Z23 Encounter for immunization: Secondary | ICD-10-CM | POA: Diagnosis not present

## 2017-05-21 DIAGNOSIS — Z3A27 27 weeks gestation of pregnancy: Secondary | ICD-10-CM | POA: Diagnosis not present

## 2017-05-21 DIAGNOSIS — O4412 Placenta previa with hemorrhage, second trimester: Secondary | ICD-10-CM | POA: Diagnosis not present

## 2017-05-29 DIAGNOSIS — G894 Chronic pain syndrome: Secondary | ICD-10-CM | POA: Diagnosis not present

## 2017-05-29 DIAGNOSIS — M545 Low back pain: Secondary | ICD-10-CM | POA: Diagnosis not present

## 2017-06-07 DIAGNOSIS — O4413 Placenta previa with hemorrhage, third trimester: Secondary | ICD-10-CM | POA: Diagnosis not present

## 2017-06-07 DIAGNOSIS — Z3A29 29 weeks gestation of pregnancy: Secondary | ICD-10-CM | POA: Diagnosis not present

## 2017-06-25 DIAGNOSIS — O4403 Placenta previa specified as without hemorrhage, third trimester: Secondary | ICD-10-CM | POA: Diagnosis not present

## 2017-06-25 DIAGNOSIS — Z3A32 32 weeks gestation of pregnancy: Secondary | ICD-10-CM | POA: Diagnosis not present

## 2017-06-28 DIAGNOSIS — G894 Chronic pain syndrome: Secondary | ICD-10-CM | POA: Diagnosis not present

## 2017-07-09 DIAGNOSIS — O4403 Placenta previa specified as without hemorrhage, third trimester: Secondary | ICD-10-CM | POA: Diagnosis not present

## 2017-07-09 DIAGNOSIS — Z3A34 34 weeks gestation of pregnancy: Secondary | ICD-10-CM | POA: Diagnosis not present

## 2017-07-10 ENCOUNTER — Encounter (HOSPITAL_COMMUNITY): Payer: Self-pay

## 2017-07-10 ENCOUNTER — Telehealth (HOSPITAL_COMMUNITY): Payer: Self-pay | Admitting: *Deleted

## 2017-07-10 NOTE — Telephone Encounter (Signed)
Preadmission screen  

## 2017-07-11 ENCOUNTER — Telehealth (HOSPITAL_COMMUNITY): Payer: Self-pay | Admitting: *Deleted

## 2017-07-11 NOTE — Telephone Encounter (Signed)
Preadmission screen  

## 2017-07-12 ENCOUNTER — Telehealth (HOSPITAL_COMMUNITY): Payer: Self-pay | Admitting: *Deleted

## 2017-07-12 NOTE — Telephone Encounter (Signed)
Preadmission screen  

## 2017-07-13 ENCOUNTER — Telehealth (HOSPITAL_COMMUNITY): Payer: Self-pay | Admitting: *Deleted

## 2017-07-13 NOTE — Telephone Encounter (Signed)
Preadmission screen  

## 2017-07-16 ENCOUNTER — Telehealth (HOSPITAL_COMMUNITY): Payer: Self-pay | Admitting: *Deleted

## 2017-07-16 DIAGNOSIS — Z3480 Encounter for supervision of other normal pregnancy, unspecified trimester: Secondary | ICD-10-CM | POA: Diagnosis not present

## 2017-07-16 NOTE — Telephone Encounter (Signed)
Preadmission screen  

## 2017-07-17 ENCOUNTER — Encounter (HOSPITAL_COMMUNITY): Payer: Self-pay

## 2017-07-17 ENCOUNTER — Telehealth (HOSPITAL_COMMUNITY): Payer: Self-pay | Admitting: *Deleted

## 2017-07-17 NOTE — Telephone Encounter (Signed)
Preadmission screen  

## 2017-07-18 DIAGNOSIS — Z3A35 35 weeks gestation of pregnancy: Secondary | ICD-10-CM | POA: Diagnosis not present

## 2017-07-18 DIAGNOSIS — O4403 Placenta previa specified as without hemorrhage, third trimester: Secondary | ICD-10-CM | POA: Diagnosis not present

## 2017-07-23 ENCOUNTER — Encounter (HOSPITAL_COMMUNITY)
Admission: RE | Admit: 2017-07-23 | Discharge: 2017-07-23 | Disposition: A | Discharge status: Home / Self Care | Payer: 59 | Source: Ambulatory Visit | Attending: Obstetrics and Gynecology | Admitting: Obstetrics and Gynecology

## 2017-07-23 LAB — CBC
HCT: 33.2 % — ABNORMAL LOW (ref 36.0–46.0)
HEMOGLOBIN: 11.4 g/dL — AB (ref 12.0–15.0)
MCH: 30.4 pg (ref 26.0–34.0)
MCHC: 34.3 g/dL (ref 30.0–36.0)
MCV: 88.5 fL (ref 78.0–100.0)
PLATELETS: 269 10*3/uL (ref 150–400)
RBC: 3.75 MIL/uL — AB (ref 3.87–5.11)
RDW: 13.7 % (ref 11.5–15.5)
WBC: 13.4 10*3/uL — ABNORMAL HIGH (ref 4.0–10.5)

## 2017-07-23 LAB — TYPE AND SCREEN
ABO/RH(D): B POS
Antibody Screen: NEGATIVE

## 2017-07-23 LAB — RAPID HIV SCREEN (HIV 1/2 AB+AG)
HIV 1/2 Antibodies: NONREACTIVE
HIV-1 P24 Antigen - HIV24: NONREACTIVE

## 2017-07-23 NOTE — Patient Instructions (Signed)
Jacquelyne A Basher  07/23/2017   Your procedure is scheduled on:  07/25/2017  Enter through the Main Entrance of [facility] at [place] up the phone at the desk and dial [phone]  Call this number if you have problems the morning of [phone]  Remember:   Do not eat food:(After Midnight) Desps de medianoche.  Do not drink clear liquids: (After Midnight) Desps de medianoche.  Take these medicines the morning of surgery with A SIP OF WATER: take your subutex as prescribed   Do not wear jewelry, make-up or nail polish.  Do not wear lotions, powders, or perfumes. Do not wear deodorant.  Do not shave 48 hours prior to surgery.  Do not bring valuables to the hospital.  [facility] is not   responsible for any belongings or valuables brought to the hospital.  Contacts, dentures or bridgework may not be worn into surgery.  Leave suitcase in the car. After surgery it may be brought to your room.  For patients admitted to the hospital, checkout time is 11:00 AM the day of              discharge.    N/A   Please read over the following fact sheets that you were given:   Surgical Site Infection Prevention

## 2017-07-24 ENCOUNTER — Encounter (HOSPITAL_COMMUNITY): Payer: Self-pay | Admitting: Anesthesiology

## 2017-07-24 ENCOUNTER — Encounter (HOSPITAL_COMMUNITY)
Admission: RE | Admit: 2017-07-24 | Discharge: 2017-07-24 | Disposition: A | Discharge status: Home / Self Care | Payer: 59 | Source: Ambulatory Visit | Attending: Obstetrics and Gynecology | Admitting: Obstetrics and Gynecology

## 2017-07-24 LAB — RPR: RPR: NONREACTIVE

## 2017-07-24 NOTE — Anesthesia Preprocedure Evaluation (Addendum)
Anesthesia Evaluation  Patient identified by MRN, date of birth, ID band Patient awake  General Assessment Comment:Previous C/section she states she could not move her legs or walk for 24 hours  Reviewed: Allergy & Precautions, NPO status , Patient's Chart, lab work & pertinent test results  Airway Mallampati: II  TM Distance: >3 FB Neck ROM: Full    Dental  (+) Teeth Intact, Caps   Pulmonary Current Smoker,    Pulmonary exam normal breath sounds clear to auscultation       Cardiovascular negative cardio ROS Normal cardiovascular exam Rhythm:Regular Rate:Normal     Neuro/Psych PSYCHIATRIC DISORDERS Depression  Neuromuscular disease    GI/Hepatic negative GI ROS, (+)     substance abuse  , Hx/o opioid dependence on subutex- last dose 4am   Endo/Other  negative endocrine ROS  Renal/GU negative Renal ROS  negative genitourinary   Musculoskeletal  (+) Arthritis , Osteoarthritis,  Chronic back pain S/P lumbar fusion Right leg radiculopathy and numbness   Abdominal   Peds  Hematology  (+) anemia ,   Anesthesia Other Findings   Reproductive/Obstetrics (+) Pregnancy Hx/o previous C/Section Posterior placenta previa AMA                          Anesthesia Physical Anesthesia Plan  ASA: II  Anesthesia Plan: Epidural   Post-op Pain Management:    Induction:   PONV Risk Score and Plan: Scopolamine patch - Pre-op, Ondansetron, Dexamethasone and Treatment may vary due to age or medical condition  Airway Management Planned: Natural Airway  Additional Equipment:   Intra-op Plan:   Post-operative Plan:   Informed Consent: I have reviewed the patients History and Physical, chart, labs and discussed the procedure including the risks, benefits and alternatives for the proposed anesthesia with the patient or authorized representative who has indicated his/her understanding and acceptance.      Plan Discussed with: CRNA and Surgeon  Anesthesia Plan Comments:        Anesthesia Quick Evaluation

## 2017-07-24 NOTE — H&P (Signed)
NAME: [name], [name] MEDICAL RECORD PR:[id number] ACCOUNT [id number] DATE OF BIRTH:Mar 16, 1975 FACILITY: [facility] LOCATION: [facility] PHYSICIAN:Jaymian Bogart Garry Heater, MD  HISTORY AND PHYSICAL  DATE OF ADMISSION:  07/25/2017  CHIEF COMPLAINT:  Repeat cesarean section at term, history  of posterior placenta previa.  HISTORY OF PRESENT ILLNESS:  A 42 year old G5, P2-0-2-2, first delivery in 2008 vaginally.  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  She is being managed by [facility] for management of  her opioid addiction, has been on Subutex along with her prenatal vitamins.  Initially a plan VBAC, but due to the persistence of the posterior previa, will present now for repeat cesarean section.  This procedure including the specific risks regarding  bleeding, infection, transfusion, wound infection, phlebitis, adjacent organ injury all reviewed.  Possibility of blood transfusion or bleeding that may necessitate cesarean, hysterectomy discussed with her also which she understands and accepts.  She did have a panorama screening with this pregnancy that was normal.  Last ultrasound dated 06/23/2017 showed a posterior previa with normal growth.  She had had some minimal spotting, but no significant bleeding.  She has continued to have a smoking  history during this pregnancy.  Blood type is B positive.  PAST MEDICAL HISTORY:    ALLERGIES:  None.  OBSTETRIC HISTORY:  As listed above.  PAST SURGICAL HISTORY:  She has had a cesarean section, lumbar fusion, laparoscopic cholecystectomy, bunionectomy diagnostic laparoscopy in 2009, breast augmentation and a second lumbar fusion   SOCIAL AND FAMILY:  For the remainder of her medications, please see the [place] form for details.  PHYSICAL EXAMINATION: VITAL SIGNS:  Temperature 98.2, blood pressure 120/78. HEENT:  Unremarkable. NECK:  Supple, without masses. LUNGS:  Clear. HEART:   Regular rate and rhythm without murmurs, rubs or gallops. BREASTS:  Not examined.  Term fundal height.  Fetal heart rate 140.  Cervix was closed. NEUROLOGIC:  Unremarkable.  IMPRESSION: 1.  Term pregnancy, previous cesarean section. 2.  Posterior placenta previa. 3.  Smoking history. 4.  History of opioid addiction, currently on Suboxone.   PLAN:  Repeat cesarean section.  Procedure and risks reviewed as above.  AN/NUANCE  D:06/27/2017 T:06/27/2017 JOB:[id number]

## 2017-07-25 ENCOUNTER — Inpatient Hospital Stay (HOSPITAL_COMMUNITY): Payer: 59 | Admitting: Anesthesiology

## 2017-07-25 ENCOUNTER — Inpatient Hospital Stay (HOSPITAL_COMMUNITY)
Admission: AD | Admit: 2017-07-25 | Discharge: 2017-07-28 | DRG: 787 | Disposition: A | Discharge status: Home / Self Care | Payer: 59 | Attending: Obstetrics and Gynecology | Admitting: Obstetrics and Gynecology

## 2017-07-25 ENCOUNTER — Encounter (HOSPITAL_COMMUNITY)
Admission: AD | Disposition: A | Discharge status: Home / Self Care | Payer: Self-pay | Source: Home / Self Care | Attending: Obstetrics and Gynecology

## 2017-07-25 ENCOUNTER — Encounter (HOSPITAL_COMMUNITY): Payer: Self-pay | Admitting: *Deleted

## 2017-07-25 DIAGNOSIS — Z87891 Personal history of nicotine dependence: Secondary | ICD-10-CM

## 2017-07-25 DIAGNOSIS — O99324 Drug use complicating childbirth: Secondary | ICD-10-CM | POA: Diagnosis present

## 2017-07-25 DIAGNOSIS — O4403 Placenta previa specified as without hemorrhage, third trimester: Secondary | ICD-10-CM | POA: Diagnosis present

## 2017-07-25 DIAGNOSIS — Z3490 Encounter for supervision of normal pregnancy, unspecified, unspecified trimester: Secondary | ICD-10-CM

## 2017-07-25 DIAGNOSIS — O34211 Maternal care for low transverse scar from previous cesarean delivery: Principal | ICD-10-CM | POA: Diagnosis present

## 2017-07-25 DIAGNOSIS — D649 Anemia, unspecified: Secondary | ICD-10-CM | POA: Diagnosis present

## 2017-07-25 DIAGNOSIS — O9902 Anemia complicating childbirth: Secondary | ICD-10-CM | POA: Diagnosis present

## 2017-07-25 DIAGNOSIS — F1120 Opioid dependence, uncomplicated: Secondary | ICD-10-CM | POA: Diagnosis present

## 2017-07-25 DIAGNOSIS — O4423 Partial placenta previa NOS or without hemorrhage, third trimester: Secondary | ICD-10-CM | POA: Diagnosis not present

## 2017-07-25 DIAGNOSIS — O34219 Maternal care for unspecified type scar from previous cesarean delivery: Secondary | ICD-10-CM | POA: Diagnosis not present

## 2017-07-25 DIAGNOSIS — Z3A36 36 weeks gestation of pregnancy: Secondary | ICD-10-CM | POA: Diagnosis not present

## 2017-07-25 SURGERY — Surgical Case
Anesthesia: Epidural

## 2017-07-25 MED ORDER — KETOROLAC TROMETHAMINE 30 MG/ML IJ SOLN
INTRAMUSCULAR | Status: AC
  Filled 2017-07-25: qty 1

## 2017-07-25 MED ORDER — DIPHENHYDRAMINE HCL 25 MG PO CAPS: 25.0000 mg | ORAL_CAPSULE | Freq: Four times a day (QID) | ORAL | Status: DC | PRN

## 2017-07-25 MED ORDER — ACETAMINOPHEN 325 MG PO TABS: 650.0000 mg | ORAL_TABLET | ORAL | Status: DC | PRN

## 2017-07-25 MED ORDER — NALBUPHINE HCL 10 MG/ML IJ SOLN: 5.0000 mg | Freq: Once | INTRAMUSCULAR | Status: DC | PRN

## 2017-07-25 MED ORDER — SODIUM CHLORIDE 0.9 % IR SOLN
Status: DC | PRN
  Administered 2017-07-25: 1

## 2017-07-25 MED ORDER — FENTANYL CITRATE (PF) 100 MCG/2ML IJ SOLN
INTRAMUSCULAR | Status: AC
  Filled 2017-07-25: qty 2

## 2017-07-25 MED ORDER — SIMETHICONE 80 MG PO CHEW
80.0000 mg | CHEWABLE_TABLET | Freq: Three times a day (TID) | ORAL | Status: DC
  Administered 2017-07-25 – 2017-07-28 (×7): 80 mg via ORAL
  Filled 2017-07-25 (×6): qty 1

## 2017-07-25 MED ORDER — COCONUT OIL OIL: 1.0000 "application " | TOPICAL_OIL | Status: DC | PRN

## 2017-07-25 MED ORDER — SIMETHICONE 80 MG PO CHEW: 80.0000 mg | CHEWABLE_TABLET | ORAL | Status: DC | PRN

## 2017-07-25 MED ORDER — BISACODYL 10 MG RE SUPP: 10.0000 mg | Freq: Every day | RECTAL | Status: DC | PRN

## 2017-07-25 MED ORDER — IBUPROFEN 800 MG PO TABS
800.0000 mg | ORAL_TABLET | Freq: Three times a day (TID) | ORAL | Status: DC | PRN
  Administered 2017-07-25 – 2017-07-27 (×4): 800 mg via ORAL
  Filled 2017-07-25 (×4): qty 1

## 2017-07-25 MED ORDER — HYDROMORPHONE HCL 1 MG/ML IJ SOLN
1.0000 mg | INTRAMUSCULAR | Status: AC | PRN
  Administered 2017-07-25 – 2017-07-26 (×2): 1 mg via INTRAVENOUS
  Filled 2017-07-25 (×2): qty 1

## 2017-07-25 MED ORDER — MORPHINE SULFATE (PF) 0.5 MG/ML IJ SOLN
INTRAMUSCULAR | Status: DC | PRN
  Administered 2017-07-25: 4 mg via EPIDURAL

## 2017-07-25 MED ORDER — SIMETHICONE 80 MG PO CHEW
80.0000 mg | CHEWABLE_TABLET | ORAL | Status: DC
  Administered 2017-07-25 – 2017-07-26 (×2): 80 mg via ORAL
  Filled 2017-07-25 (×2): qty 1

## 2017-07-25 MED ORDER — DIBUCAINE 1 % RE OINT: 1.0000 "application " | TOPICAL_OINTMENT | RECTAL | Status: DC | PRN

## 2017-07-25 MED ORDER — OXYCODONE-ACETAMINOPHEN 5-325 MG PO TABS
2.0000 | ORAL_TABLET | ORAL | Status: DC | PRN
  Administered 2017-07-25 – 2017-07-28 (×8): 2 via ORAL
  Filled 2017-07-25 (×8): qty 2

## 2017-07-25 MED ORDER — MEASLES, MUMPS & RUBELLA VAC ~~LOC~~ INJ: 0.5000 mL | INJECTION | Freq: Once | SUBCUTANEOUS | Status: DC

## 2017-07-25 MED ORDER — PHENYLEPHRINE 8 MG IN D5W 100 ML (0.08MG/ML) PREMIX OPTIME
INJECTION | INTRAVENOUS | Status: AC
  Filled 2017-07-25: qty 100

## 2017-07-25 MED ORDER — FENTANYL CITRATE (PF) 100 MCG/2ML IJ SOLN
INTRAMUSCULAR | Status: DC | PRN
  Administered 2017-07-25: 90 ug via INTRAVENOUS

## 2017-07-25 MED ORDER — SODIUM CHLORIDE 0.9% FLUSH: 3.0000 mL | INTRAVENOUS | Status: DC | PRN

## 2017-07-25 MED ORDER — OXYTOCIN 40 UNITS IN LACTATED RINGERS INFUSION - SIMPLE MED: 2.5000 [IU]/h | INTRAVENOUS | Status: AC

## 2017-07-25 MED ORDER — MEPERIDINE HCL 25 MG/ML IJ SOLN: 6.2500 mg | INTRAMUSCULAR | Status: DC | PRN

## 2017-07-25 MED ORDER — ONDANSETRON HCL 4 MG/2ML IJ SOLN: 4.0000 mg | Freq: Three times a day (TID) | INTRAMUSCULAR | Status: DC | PRN

## 2017-07-25 MED ORDER — KETOROLAC TROMETHAMINE 30 MG/ML IJ SOLN
30.0000 mg | Freq: Four times a day (QID) | INTRAMUSCULAR | Status: AC | PRN
  Administered 2017-07-25: 30 mg via INTRAMUSCULAR

## 2017-07-25 MED ORDER — CEFOTETAN DISODIUM-DEXTROSE 2-2.08 GM-%(50ML) IV SOLR
2.0000 g | INTRAVENOUS | Status: AC
  Administered 2017-07-25: 2 g via INTRAVENOUS
  Filled 2017-07-25: qty 50

## 2017-07-25 MED ORDER — DIPHENHYDRAMINE HCL 25 MG PO CAPS: 25.0000 mg | ORAL_CAPSULE | ORAL | Status: DC | PRN

## 2017-07-25 MED ORDER — KETOROLAC TROMETHAMINE 30 MG/ML IJ SOLN: 30.0000 mg | Freq: Four times a day (QID) | INTRAMUSCULAR | Status: AC | PRN

## 2017-07-25 MED ORDER — TETANUS-DIPHTH-ACELL PERTUSSIS 5-2.5-18.5 LF-MCG/0.5 IM SUSP: 0.5000 mL | Freq: Once | INTRAMUSCULAR | Status: DC

## 2017-07-25 MED ORDER — SODIUM CHLORIDE 0.9 % IV SOLN: 250.0000 mL | INTRAVENOUS | Status: DC

## 2017-07-25 MED ORDER — ONDANSETRON HCL 4 MG/2ML IJ SOLN
INTRAMUSCULAR | Status: AC
  Filled 2017-07-25: qty 2

## 2017-07-25 MED ORDER — LIDOCAINE-EPINEPHRINE 2 %-1:100000 IJ SOLN
INTRAMUSCULAR | Status: DC | PRN
  Administered 2017-07-25 (×2): 5 mL via INTRADERMAL

## 2017-07-25 MED ORDER — NALBUPHINE HCL 10 MG/ML IJ SOLN: 5.0000 mg | INTRAMUSCULAR | Status: DC | PRN

## 2017-07-25 MED ORDER — SENNOSIDES-DOCUSATE SODIUM 8.6-50 MG PO TABS
2.0000 | ORAL_TABLET | ORAL | Status: DC
  Administered 2017-07-25 – 2017-07-26 (×2): 2 via ORAL
  Filled 2017-07-25 (×2): qty 2

## 2017-07-25 MED ORDER — WITCH HAZEL-GLYCERIN EX PADS: 1.0000 "application " | MEDICATED_PAD | CUTANEOUS | Status: DC | PRN

## 2017-07-25 MED ORDER — ZOLPIDEM TARTRATE 5 MG PO TABS: 5.0000 mg | ORAL_TABLET | Freq: Every evening | ORAL | Status: DC | PRN

## 2017-07-25 MED ORDER — NALOXONE HCL 4 MG/10ML IJ SOLN
1.0000 ug/kg/h | INTRAVENOUS | Status: DC | PRN
  Filled 2017-07-25: qty 5

## 2017-07-25 MED ORDER — PHENYLEPHRINE 8 MG IN D5W 100 ML (0.08MG/ML) PREMIX OPTIME
INJECTION | INTRAVENOUS | Status: DC | PRN
  Administered 2017-07-25: 20 ug/min via INTRAVENOUS

## 2017-07-25 MED ORDER — LACTATED RINGERS IV SOLN
INTRAVENOUS | Status: DC
  Administered 2017-07-25: 07:00:00 via INTRAVENOUS

## 2017-07-25 MED ORDER — OXYTOCIN 10 UNIT/ML IJ SOLN
INTRAMUSCULAR | Status: DC | PRN
  Administered 2017-07-25: 40 [IU] via INTRAVENOUS

## 2017-07-25 MED ORDER — SODIUM CHLORIDE 0.9% FLUSH: 3.0000 mL | Freq: Two times a day (BID) | INTRAVENOUS | Status: DC

## 2017-07-25 MED ORDER — MENTHOL 3 MG MT LOZG: 1.0000 | LOZENGE | OROMUCOSAL | Status: DC | PRN

## 2017-07-25 MED ORDER — MORPHINE SULFATE (PF) 0.5 MG/ML IJ SOLN
INTRAMUSCULAR | Status: AC
  Filled 2017-07-25: qty 10

## 2017-07-25 MED ORDER — NALOXONE HCL 0.4 MG/ML IJ SOLN: 0.4000 mg | INTRAMUSCULAR | Status: DC | PRN

## 2017-07-25 MED ORDER — FLEET ENEMA 7-19 GM/118ML RE ENEM: 1.0000 | ENEMA | Freq: Every day | RECTAL | Status: DC | PRN

## 2017-07-25 MED ORDER — FENTANYL CITRATE (PF) 100 MCG/2ML IJ SOLN
25.0000 ug | INTRAMUSCULAR | Status: DC | PRN
  Administered 2017-07-25: 50 ug via INTRAVENOUS
  Administered 2017-07-25 (×2): 25 ug via INTRAVENOUS

## 2017-07-25 MED ORDER — LIDOCAINE-EPINEPHRINE (PF) 2 %-1:200000 IJ SOLN
INTRAMUSCULAR | Status: DC | PRN
  Administered 2017-07-25 (×4): 5 mL via EPIDURAL

## 2017-07-25 MED ORDER — OXYTOCIN 10 UNIT/ML IJ SOLN
INTRAMUSCULAR | Status: AC
  Filled 2017-07-25: qty 4

## 2017-07-25 MED ORDER — SODIUM BICARBONATE 8.4 % IV SOLN
INTRAVENOUS | Status: AC
  Filled 2017-07-25: qty 50

## 2017-07-25 MED ORDER — DEXAMETHASONE SODIUM PHOSPHATE 4 MG/ML IJ SOLN
INTRAMUSCULAR | Status: AC
Start: 2017-07-25 — End: ?
  Filled 2017-07-25: qty 1

## 2017-07-25 MED ORDER — DEXTROSE IN LACTATED RINGERS 5 % IV SOLN
INTRAVENOUS | Status: DC
  Administered 2017-07-25 – 2017-07-27 (×4): via INTRAVENOUS

## 2017-07-25 MED ORDER — ONDANSETRON HCL 4 MG/2ML IJ SOLN
INTRAMUSCULAR | Status: DC | PRN
  Administered 2017-07-25: 4 mg via INTRAVENOUS

## 2017-07-25 MED ORDER — DIPHENHYDRAMINE HCL 50 MG/ML IJ SOLN: 12.5000 mg | INTRAMUSCULAR | Status: DC | PRN

## 2017-07-25 MED ORDER — PRENATAL MULTIVITAMIN CH
1.0000 | ORAL_TABLET | Freq: Every day | ORAL | Status: DC
  Administered 2017-07-27 – 2017-07-28 (×2): 1 via ORAL
  Filled 2017-07-25: qty 1

## 2017-07-25 MED ORDER — OXYCODONE-ACETAMINOPHEN 5-325 MG PO TABS: 1.0000 | ORAL_TABLET | ORAL | Status: DC | PRN

## 2017-07-25 MED ORDER — LIDOCAINE-EPINEPHRINE (PF) 2 %-1:200000 IJ SOLN
INTRAMUSCULAR | Status: AC
  Filled 2017-07-25: qty 20

## 2017-07-25 MED ORDER — DEXAMETHASONE SODIUM PHOSPHATE 4 MG/ML IJ SOLN
INTRAMUSCULAR | Status: DC | PRN
  Administered 2017-07-25: 4 mg via INTRAVENOUS

## 2017-07-25 SURGICAL SUPPLY — 28 items
BENZOIN TINCTURE PRP APPL 2/3 (GAUZE/BANDAGES/DRESSINGS) ×3 IMPLANT
CHLORAPREP W/TINT 26ML (MISCELLANEOUS) ×3 IMPLANT
CLAMP CORD UMBIL (MISCELLANEOUS) IMPLANT
CLOSURE STERI STRIP 1/2 X4 (GAUZE/BANDAGES/DRESSINGS) ×3 IMPLANT
CLOTH BEACON ORANGE TIMEOUT ST (SAFETY) ×3 IMPLANT
DRSG OPSITE POSTOP 4X10 (GAUZE/BANDAGES/DRESSINGS) ×3 IMPLANT
ELECT REM PT RETURN 9FT ADLT (ELECTROSURGICAL) ×3
ELECTRODE REM PT RTRN 9FT ADLT (ELECTROSURGICAL) ×1 IMPLANT
EXTRACTOR VACUUM M CUP 4 TUBE (SUCTIONS) IMPLANT
EXTRACTOR VACUUM M CUP 4' TUBE (SUCTIONS)
GLOVE BIO SURGEON STRL SZ7 (GLOVE) ×3 IMPLANT
GLOVE BIOGEL PI IND STRL 7.0 (GLOVE) ×2 IMPLANT
GLOVE BIOGEL PI INDICATOR 7.0 (GLOVE) ×4
GOWN STRL REUS W/TWL LRG LVL3 (GOWN DISPOSABLE) ×6 IMPLANT
KIT ABG SYR 3ML LUER SLIP (SYRINGE) IMPLANT
NEEDLE HYPO 25X5/8 SAFETYGLIDE (NEEDLE) ×3 IMPLANT
NS IRRIG 1000ML POUR BTL (IV SOLUTION) ×3 IMPLANT
PACK C SECTION WH (CUSTOM PROCEDURE TRAY) ×3 IMPLANT
PAD OB MATERNITY 4.3X12.25 (PERSONAL CARE ITEMS) ×3 IMPLANT
PENCIL SMOKE EVAC W/HOLSTER (ELECTROSURGICAL) ×3 IMPLANT
STRIP CLOSURE SKIN 1/2X4 (GAUZE/BANDAGES/DRESSINGS) ×2 IMPLANT
SUT CHROMIC 0 CTX 36 (SUTURE) ×9 IMPLANT
SUT MON AB 4-0 PS1 27 (SUTURE) ×3 IMPLANT
SUT PDS AB 0 CT1 27 (SUTURE) ×12 IMPLANT
SUT VIC AB 3-0 CT1 27 (SUTURE) ×4
SUT VIC AB 3-0 CT1 TAPERPNT 27 (SUTURE) ×2 IMPLANT
TOWEL OR 17X24 6PK STRL BLUE (TOWEL DISPOSABLE) ×3 IMPLANT
TRAY FOLEY W/BAG SLVR 14FR LF (SET/KITS/TRAYS/PACK) ×3 IMPLANT

## 2017-07-25 NOTE — Anesthesia Postprocedure Evaluation (Signed)
Anesthesia Post Note  Patient: [name]  Procedure(s) Performed: REPEAT CESAREAN SECTION (N/A )     Patient location during evaluation: Mother Baby Anesthesia Type: Epidural Level of consciousness: awake, awake and alert and oriented Pain management: pain level not controlled (on Subutex. Nurse aware) Vital Signs Assessment: post-procedure vital signs reviewed and stable Respiratory status: spontaneous breathing Cardiovascular status: blood pressure returned to baseline Postop Assessment: no headache, epidural receding, patient able to bend at knees, adequate PO intake, no backache, no apparent nausea or vomiting and able to ambulate Anesthetic complications: no    Last Vitals:  Vitals:   07/25/17 1045 07/25/17 1123  BP: 95/67 100/66  Pulse: 68 63  Resp: 16 18  Temp: 36.4 C 36.6 C  SpO2: 98% 97%    Last Pain:  Vitals:   07/25/17 1125  TempSrc:   PainSc: 0-No pain   Pain Goal:                 Bufford Spikes

## 2017-07-25 NOTE — Anesthesia Procedure Notes (Signed)
Epidural Patient location during procedure: OR Start time: 07/25/2017 8:30 AM  Staffing Anesthesiologist: Josephine Igo, MD  Preanesthetic Checklist Completed: patient identified, site marked, surgical consent, pre-op evaluation, timeout performed, IV checked, risks and benefits discussed and monitors and equipment checked  Epidural Patient position: sitting Prep: site prepped and draped and DuraPrep Patient monitoring: continuous pulse ox and blood pressure Approach: midline Location: L3-L4 Injection technique: LOR air  Needle:  Needle type: Tuohy  Needle gauge: 17 G Needle length: 9 cm and 9 Needle insertion depth: 6 cm Catheter type: closed end flexible Catheter size: 19 Gauge Catheter at skin depth: 10 cm Test dose: negative and 2% lidocaine with Epi 1:200 K  Assessment Events: blood not aspirated, injection not painful, no injection resistance, negative IV test and no paresthesia  Additional Notes Patient tolerated procedure well. Epidural being dosed for C/Section.

## 2017-07-25 NOTE — Transfer of Care (Signed)
Immediate Anesthesia Transfer of Care Note  Patient: [name]  Procedure(s) Performed: REPEAT CESAREAN SECTION (N/A )  Patient Location: PACU  Anesthesia Type:Epidural  Level of Consciousness: awake  Airway & Oxygen Therapy: Patient Spontanous Breathing  Post-op Assessment: Report given to RN  Post vital signs: Reviewed and stable  Last Vitals:  Vitals Value Taken Time  BP    Temp    Pulse    Resp    SpO2      Last Pain:  Vitals:   07/25/17 0656  TempSrc: Oral  PainSc: 6          Complications: No apparent anesthesia complications

## 2017-07-25 NOTE — Progress Notes (Signed)
The patient was re-examined with no change in status 

## 2017-07-25 NOTE — Op Note (Signed)
Preoperative diagnosis: 36.5-week IUP, previous cesarean section, posterior placenta previa  Postoperative diagnosis: Same  Procedure: Repeat low transverse cesarean section  Surgeon: Matthew Saras  Assistant: Low  EBL: 700 cc  Procedure and findings:  Patient was taken to the operating room after an adequate level of epidural anesthesia was obtained with the patient in left tilt position the abdomen prepped and draped in normal fashion Foley catheter was position appropriate timeouts were taken.  Incision made to the old scar which is well-healed, this is carried down the fascia which was incised and extended transversely.  Rectus muscles divided in the midline, peritoneum entered superiorly without incident and extended in a vertical fashion.  The vesicouterine serosa was then incised, bluntly and sharply dissected below, bladder blade repositioned.  Transverse incision made in the lower segment extended with bandage scissors clear fluid noted patient then delivered of a vigorous infant, the infant was suctioned cord clamped and passed to the pediatric team for further care.  The placenta was then delivered spontaneously intact it was a posterior placenta previa there was minimal bleeding cavity was clean closure obtained with a first labral chromic in a locked fashion followed by number Kaleab a chromic.  This was hemostatic bladder flap area was intact and hemostatic bilateral tubes and ovaries are normal.  Peritoneum closed with a 3-0 Vicryl running suture of the same on the rectus muscles in the midline 0 PDS to close the fascia transversely the subcutaneous tissue was hemostatic and fairly thin was not closed separately 4-0 Monocryl subcuticular closure with a pressure dressing.  Clear urine noted at the end of the case.  She tolerated this well went to recovery room in good condition.  Dictated with Dragon Medical 1  Margarette Asal MD

## 2017-07-25 NOTE — Progress Notes (Signed)
Patient complaining of incisional pain of 11. Called Dr. Royce Macadamia with Anesthesia and order obtained for Dilaudid IV.

## 2017-07-26 LAB — CBC
HCT: 33 % — ABNORMAL LOW (ref 36.0–46.0)
Hemoglobin: 11.1 g/dL — ABNORMAL LOW (ref 12.0–15.0)
MCH: 30.2 pg (ref 26.0–34.0)
MCHC: 33.6 g/dL (ref 30.0–36.0)
MCV: 89.9 fL (ref 78.0–100.0)
PLATELETS: 236 10*3/uL (ref 150–400)
RBC: 3.67 MIL/uL — ABNORMAL LOW (ref 3.87–5.11)
RDW: 14.1 % (ref 11.5–15.5)
WBC: 14.4 10*3/uL — ABNORMAL HIGH (ref 4.0–10.5)

## 2017-07-26 MED ORDER — BISACODYL 10 MG RE SUPP
10.0000 mg | Freq: Once | RECTAL | Status: AC
  Administered 2017-07-26: 10 mg via RECTAL
  Filled 2017-07-26: qty 1

## 2017-07-26 MED ORDER — BUPRENORPHINE HCL 8 MG SL SUBL: 8.0000 mg | SUBLINGUAL_TABLET | Freq: Two times a day (BID) | SUBLINGUAL | Status: DC

## 2017-07-26 MED ORDER — LIDOCAINE 5 % EX PTCH
1.0000 | MEDICATED_PATCH | CUTANEOUS | Status: AC
  Administered 2017-07-26: 1 via TRANSDERMAL
  Filled 2017-07-26: qty 1

## 2017-07-26 MED ORDER — BUPRENORPHINE HCL 2 MG SL SUBL
4.0000 mg | SUBLINGUAL_TABLET | Freq: Two times a day (BID) | SUBLINGUAL | Status: DC
  Administered 2017-07-26 – 2017-07-28 (×5): 4 mg via SUBLINGUAL
  Filled 2017-07-26 (×5): qty 2

## 2017-07-26 MED ORDER — HYDROMORPHONE HCL 1 MG/ML IJ SOLN
2.0000 mg | Freq: Once | INTRAMUSCULAR | Status: AC
  Administered 2017-07-26: 2 mg via INTRAVENOUS
  Filled 2017-07-26: qty 2

## 2017-07-26 NOTE — Progress Notes (Addendum)
RN notified Dr. Corinna Capra of patient's uncontrolled pain status despite the 1mg  of dilaudid IV given at 0455. RN requested MD to come assess patient. MD states "I feel we are doing everything we can right now." and feels like pain management is the best course of action. Dr. Corinna Capra gave orders to give 2mg  Dilaudid IV now. Will continue to monitor

## 2017-07-26 NOTE — Progress Notes (Signed)
Subjective: Postpartum Day 1: Cesarean Delivery Patient reports incisional pain and tolerating PO.   Patient begging for pain relief. "I can't take this". Patient on buprenorphine bid until delivery yesterday.  Objective: Vital signs in last 24 hours: Temp:  [97.5 F (36.4 C)-98.3 F (36.8 C)] 98.1 F (36.7 C) (06/20 0210) Pulse Rate:  [60-86] 60 (06/20 0515) Resp:  [11-20] 18 (06/20 0515) BP: (94-115)/(53-67) 106/58 (06/20 0515) SpO2:  [93 %-98 %] 96 % (06/20 0515)  Physical Exam:  General: alert and severe distress Lochia: appropriate Uterine Fundus: firm Incision: healing well DVT Evaluation: No evidence of DVT seen on physical exam. Abdomen soft, difficult to evaluate with  Recent Labs    07/23/17 1120 07/26/17 0607  HGB 11.4* 11.1*  HCT 33.2* 33.0*    Assessment/Plan: Status post Cesarean section. Postoperative course complicated by post operative pain  Continue current care I D/W pharmacy and verified dose. Will resume buprenorphine 4mg  SL q12 hours and continue prn pain medication as ordered D/W nurse-will do pulse oximetry q1 hour x 4 Shon Millet II 07/26/2017, 8:38 AM

## 2017-07-26 NOTE — Progress Notes (Signed)
RN notified Dr. Corinna Capra of pt having severe cramping pain rating it a 10. RN notified Dr. Corinna Capra that RN gave pt 10-650mg  oxycodone-acetaminophen at [place] and that pt states it did not help. Dr. Corinna Capra also notified that foley bag is draining and faint bowel sounds were heard. Dr. Corinna Capra instructed RN to give the 1mg  Dilaudid dose previously ordered and to see if it helps. RN will continue to monitor.

## 2017-07-26 NOTE — Progress Notes (Signed)
Patient yelling and crying for "Help!", in pain. States that she just wants to see a doctor and wants some relief. Patient educated on recent med administration she has received and there's a wait time on next dose admin. Patient unable to describe the pain to me, refused assessment at first. But allowed to look at dressing, clean and intact. Will call the doctor to see about someone coming to see her and continue to monitor. Jackson Latino, RN

## 2017-07-26 NOTE — Progress Notes (Signed)
Patient still c/o pain intolerance, however easier to talk to and not as combative.  Pain mgmt is Motrin 800, Percocet and Lidocaine patch applied. Has been in and out of sleep all day. Has not had a meal, just a couple spoons of soup. Has been up to void to bathroom independently x1. Will continue to monitor. Jackson Latino, RN

## 2017-07-26 NOTE — Progress Notes (Signed)
Immediately after buprenorphine patient slept. Now C/O lower abdominal pain again. No N/V, no flatus. Does not want to move.  Vitals:   07/26/17 1100 07/26/17 1254  BP:    Pulse: (!) 56 (!) 54  Resp:    Temp:    SpO2: 93% 96%   Abdomen soft. Difficult to evaluate-patient jumps with the slightest contact with skin. Dressing OK  A/P: will add lidoderm patch to lower abdomen above incision -on 12 hours off 12 hours         Dulcolax suppository now         If sxs persist will then get AAS

## 2017-07-27 MED ORDER — IBUPROFEN 800 MG PO TABS
800.0000 mg | ORAL_TABLET | Freq: Three times a day (TID) | ORAL | Status: DC
  Administered 2017-07-27 (×2): 800 mg via ORAL
  Filled 2017-07-27 (×4): qty 1

## 2017-07-27 MED ORDER — LIDOCAINE 5 % EX PTCH
1.0000 | MEDICATED_PATCH | CUTANEOUS | Status: DC
  Administered 2017-07-27 – 2017-07-28 (×2): 1 via TRANSDERMAL
  Filled 2017-07-27 (×3): qty 1

## 2017-07-27 NOTE — Progress Notes (Signed)
Subjective: Postpartum Day 2: Cesarean Delivery Patient reports incisional pain, tolerating PO, + flatus, + BM and no problems voiding.    Objective: Vital signs in last 24 hours: Temp:  [97.7 F (36.5 C)-99.9 F (37.7 C)] 98.2 F (36.8 C) (06/21 0646) Pulse Rate:  [49-75] 75 (06/21 0646) Resp:  [16-20] 17 (06/21 0646) BP: (111-135)/(59-76) 111/59 (06/21 0646) SpO2:  [93 %-96 %] 95 % (06/21 0646)  Physical Exam:  General: alert and cooperative Lochia: appropriate Uterine Fundus: firm Incision: healing well, no significant drainage DVT Evaluation: No evidence of DVT seen on physical exam.  Recent Labs    07/26/17 0607  HGB 11.1*  HCT 33.0*    Assessment/Plan: Status post Cesarean section.  Pain control: subutex, lidocaine patch, ibuprofen and prn percocet Saline lock IV   Marylynn Pearson 07/27/2017, 8:27 AM

## 2017-07-27 NOTE — Progress Notes (Signed)
CSW planned to meet with MOB to offer support and complete assessment due to baby's admission to NICU, and MOB's current suboxone treatment, but read chart noting uncontrolled pain at this time.  CSW spoke with RN for update and will attempt to meet with patient at a later time.

## 2017-07-27 NOTE — Lactation Note (Signed)
This note was copied from a baby's chart. Lactation Consultation Note  Patient Name: [name] Date: 07/27/2017 Reason for consult: Initial assessment;Late-preterm 34-36.6wks;NICU baby;Other (Comment)(Hx Substance abuse on Subutex)   Initial consult with mom of 74 hour old NICU infant. Mom very drowsy and spoke very little to [facility]. Mom reports she is not sure she is going to BF or offer breast milk to infant. Mom reports she has a pump in the room but she "can't " pump as she does not feel well. Mom has not been pumping. Mom denies breast fullness at this time. Mom denies questions/concerns. Mom to call out if she would like Lactation assistance. Report to Melina Copa, RN.    Maternal Data    Feeding Feeding Type: Formula Nipple Type: Slow - flow Length of feed: 30 min  LATCH Score                   Interventions    Lactation Tools Discussed/Used Initiated by:: mom currently not pumping   Consult Status Consult Status: PRN    Donn Pierini 07/27/2017, 12:33 PM

## 2017-07-27 NOTE — Progress Notes (Signed)
CSW attempted to meet with MOB to complete assessment and offer support, but she was very sleepy at this time.  CSW will attempt again at a later time.

## 2017-07-28 MED ORDER — LIDOCAINE 5 % EX PTCH: 1.0000 | MEDICATED_PATCH | CUTANEOUS | 0 refills | Status: DC

## 2017-07-28 MED ORDER — IBUPROFEN 800 MG PO TABS: 800.0000 mg | ORAL_TABLET | Freq: Three times a day (TID) | ORAL | 0 refills | Status: DC

## 2017-07-28 MED ORDER — OXYCODONE-ACETAMINOPHEN 5-325 MG PO TABS: 1.0000 | ORAL_TABLET | ORAL | 0 refills | Status: DC | PRN

## 2017-07-28 MED ORDER — FAMOTIDINE 20 MG PO TABS: 20.0000 mg | ORAL_TABLET | Freq: Two times a day (BID) | ORAL | 2 refills | Status: DC

## 2017-07-28 MED ORDER — FAMOTIDINE 20 MG PO TABS
20.0000 mg | ORAL_TABLET | Freq: Two times a day (BID) | ORAL | Status: DC
  Administered 2017-07-28: 20 mg via ORAL
  Filled 2017-07-28: qty 1

## 2017-07-28 NOTE — Progress Notes (Signed)
Encouraged pt to ambulate in hallway at least three times daily. I offered to assist pt at this time with ambulation. Pt continued to refuse ambulation at this time and stated " I might go see my baby in NICU. I offered pt assistance to NICU also. Pt refused assistance to NICU at this time too. I advised pt to call for assistance when ready to ambulate/visit baby in NICU. Will continue to monitor pt.

## 2017-07-28 NOTE — Progress Notes (Addendum)
CSW attempted to meet with patient but was unsuccessful due to patient being heavily medicated. CSW attempted to arouse patient which was a failed attempt. CSW informed Jerene Pitch, Therapist, sports about attempt.  CSW to re-attempt this afternoon.  Madilyn Fireman, MSW, [place] Social Worker [facility] [phone]

## 2017-07-28 NOTE — Discharge Summary (Signed)
Obstetric Discharge Summary Reason for Admission: cesarean section Prenatal Procedures: ultrasound Intrapartum Procedures: cesarean: low cervical, transverse Postpartum Procedures: none Complications-Operative and Postpartum: none Hemoglobin  Date Value Ref Range Status  07/26/2017 11.1 (L) 12.0 - 15.0 g/dL Final   HCT  Date Value Ref Range Status  07/26/2017 33.0 (L) 36.0 - 46.0 % Final    Physical Exam:  General: alert and cooperative Lochia: appropriate Uterine Fundus: firm Incision: healing well, no significant drainage DVT Evaluation: No evidence of DVT seen on physical exam.  Discharge Diagnoses: Term Pregnancy-delivered  Discharge Information: Date: 07/28/2017 Activity: pelvic rest Diet: routine Medications: PNV, Ibuprofen and Percocet Condition: stable Instructions: refer to practice specific booklet Discharge to: home   Newborn Data: Live born female  Birth Weight: 4 lb 13.6 oz (2200 g) APGAR: 8, 9  Newborn Delivery   Birth date/time:  07/25/2017 08:52:00 Delivery type:  C-Section, Low Transverse Trial of labor:  No C-section categorization:  Repeat     Home with mother.  [name] 07/28/2017, 4:29 PM

## 2017-07-28 NOTE — Progress Notes (Signed)
Pt being d/c this afternoon. Madilyn Fireman LCSW notified of pt's discharge per MD.  Will continue to monitor pt.

## 2017-08-01 ENCOUNTER — Encounter (HOSPITAL_COMMUNITY): Payer: Self-pay

## 2017-08-01 ENCOUNTER — Inpatient Hospital Stay (HOSPITAL_COMMUNITY)
Admission: AD | Admit: 2017-08-01 | Discharge: 2017-08-02 | Disposition: A | Discharge status: Home / Self Care | Payer: 59 | Source: Ambulatory Visit | Attending: Obstetrics and Gynecology | Admitting: Obstetrics and Gynecology

## 2017-08-01 DIAGNOSIS — N3001 Acute cystitis with hematuria: Secondary | ICD-10-CM

## 2017-08-01 DIAGNOSIS — F17210 Nicotine dependence, cigarettes, uncomplicated: Secondary | ICD-10-CM | POA: Insufficient documentation

## 2017-08-01 DIAGNOSIS — D72829 Elevated white blood cell count, unspecified: Secondary | ICD-10-CM

## 2017-08-01 DIAGNOSIS — O9089 Other complications of the puerperium, not elsewhere classified: Secondary | ICD-10-CM | POA: Insufficient documentation

## 2017-08-01 DIAGNOSIS — O99335 Smoking (tobacco) complicating the puerperium: Secondary | ICD-10-CM | POA: Diagnosis not present

## 2017-08-01 DIAGNOSIS — R112 Nausea with vomiting, unspecified: Secondary | ICD-10-CM | POA: Diagnosis not present

## 2017-08-01 DIAGNOSIS — Z98891 History of uterine scar from previous surgery: Secondary | ICD-10-CM

## 2017-08-01 DIAGNOSIS — E860 Dehydration: Secondary | ICD-10-CM | POA: Diagnosis not present

## 2017-08-01 LAB — URINALYSIS, ROUTINE W REFLEX MICROSCOPIC
Glucose, UA: NEGATIVE mg/dL
Ketones, ur: NEGATIVE mg/dL
Leukocytes, UA: NEGATIVE
Nitrite: POSITIVE — AB
Protein, ur: NEGATIVE mg/dL
pH: 5.5 (ref 5.0–8.0)

## 2017-08-01 LAB — COMPREHENSIVE METABOLIC PANEL
ALK PHOS: 97 U/L (ref 38–126)
ALT: 20 U/L (ref 0–44)
AST: 23 U/L (ref 15–41)
Albumin: 3.3 g/dL — ABNORMAL LOW (ref 3.5–5.0)
Anion gap: 16 — ABNORMAL HIGH (ref 5–15)
BILIRUBIN TOTAL: 0.8 mg/dL (ref 0.3–1.2)
BUN: 23 mg/dL — ABNORMAL HIGH (ref 6–20)
CALCIUM: 9 mg/dL (ref 8.9–10.3)
CO2: 28 mmol/L (ref 22–32)
Chloride: 90 mmol/L — ABNORMAL LOW (ref 98–111)
Creatinine, Ser: 0.67 mg/dL (ref 0.44–1.00)
GLUCOSE: 109 mg/dL — AB (ref 70–99)
POTASSIUM: 3.1 mmol/L — AB (ref 3.5–5.1)
Sodium: 134 mmol/L — ABNORMAL LOW (ref 135–145)
TOTAL PROTEIN: 7.3 g/dL (ref 6.5–8.1)

## 2017-08-01 LAB — URINALYSIS, MICROSCOPIC (REFLEX)

## 2017-08-01 LAB — CBC WITH DIFFERENTIAL/PLATELET
BASOS PCT: 0 %
Basophils Absolute: 0 10*3/uL (ref 0.0–0.1)
Eosinophils Absolute: 0.2 10*3/uL (ref 0.0–0.7)
Eosinophils Relative: 1 %
HEMATOCRIT: 42.3 % (ref 36.0–46.0)
HEMOGLOBIN: 14.5 g/dL (ref 12.0–15.0)
Lymphocytes Relative: 14 %
Lymphs Abs: 2.9 10*3/uL (ref 0.7–4.0)
MCH: 30.3 pg (ref 26.0–34.0)
MCHC: 34.3 g/dL (ref 30.0–36.0)
MCV: 88.5 fL (ref 78.0–100.0)
MONO ABS: 0.9 10*3/uL (ref 0.1–1.0)
MONOS PCT: 4 %
NEUTROS ABS: 16.7 10*3/uL — AB (ref 1.7–7.7)
Neutrophils Relative %: 81 %
Platelets: 448 10*3/uL — ABNORMAL HIGH (ref 150–400)
RBC: 4.78 MIL/uL (ref 3.87–5.11)
RDW: 13 % (ref 11.5–15.5)
WBC: 20.7 10*3/uL — ABNORMAL HIGH (ref 4.0–10.5)

## 2017-08-01 MED ORDER — SODIUM CHLORIDE 0.9 % IV SOLN
Freq: Once | INTRAVENOUS | Status: AC
  Administered 2017-08-01: 22:00:00 via INTRAVENOUS

## 2017-08-01 MED ORDER — PROMETHAZINE HCL 25 MG/ML IJ SOLN
12.5000 mg | Freq: Once | INTRAMUSCULAR | Status: AC
  Administered 2017-08-01: 12.5 mg via INTRAVENOUS
  Filled 2017-08-01: qty 1

## 2017-08-01 MED ORDER — LACTATED RINGERS IV SOLN
Freq: Once | INTRAVENOUS | Status: AC
  Administered 2017-08-01: 23:00:00 via INTRAVENOUS

## 2017-08-01 NOTE — MAU Provider Note (Signed)
Chief Complaint:  Emesis   First Provider Initiated Contact with Patient 08/01/17 2107       HPI: [name] is a 42 y.o. [id number] who presents to maternity admissions reporting nausea and vomiting since her C/S on 07/25/17.  States has had it since then and has not been able to eat or drink since then.  Did not call office for Rx.  Sometimes is lack of appetite, sometimes nausea.  No diarrhea.. She reports vaginal bleeding, but no vaginal itching/burning, urinary symptoms, h/a, dizziness, or fever/chills.   Did have a bowel movement yesterday.   Emesis   This is a recurrent problem. The current episode started 1 to 4 weeks ago. The problem has been unchanged. There has been no fever. Pertinent negatives include no abdominal pain, chills, diarrhea, dizziness, fever, headaches or myalgias. She has tried nothing for the symptoms.   RN Note: Pt here with nausea and vomiting. Had C/S on 6/19.    Past Medical History: Past Medical History:  Diagnosis Date  . AMA (advanced maternal age) multigravida 53+   . Arthritis   . Chronic back pain   . Depression    no meds   . Hx of eating disorder   . Numbness    right leg  . Substance abuse ([place])    since 2009    Past obstetric history: OB History  Gravida Para Term Preterm AB Living  5 3 2 1 2 3   SAB TAB Ectopic Multiple Live Births  1 1   0 3    # Outcome Date GA Lbr Len/2nd Weight Sex Delivery Anes PTL Lv  5 Preterm 07/25/17 [redacted]w[redacted]d  4 lb 13.6 oz (2.2 kg) M CS-LTranv EPI  LIV  4 Term 02/28/15 [redacted]w[redacted]d  5 lb 1.1 oz (2.3 kg) M CS-LTranv Spinal  LIV  3 TAB 1995          2 Term     M Vag-Spont   LIV  1 SAB             Past Surgical History: Past Surgical History:  Procedure Laterality Date  . BREAST SURGERY     augmentation  . BUNIONECTOMY Bilateral    screws  . CESAREAN SECTION N/A 02/28/2015   Procedure: CESAREAN SECTION;  Surgeon: Linda Hedges, DO;  Location: [facility];  Service: Obstetrics;  Laterality: N/A;  . CESAREAN  SECTION N/A 07/25/2017   Procedure: REPEAT CESAREAN SECTION;  Surgeon: Molli Posey, MD;  Location: [facility];  Service: Obstetrics;  Laterality: N/A;  Repeat edc 08/18/17 NKDA  . CHOLECYSTECTOMY    . DIAGNOSTIC LAPAROSCOPY    . I fuse     second back surgery states this is the name of the procedure  . LUMBAR FUSION      Family History: Family History  Problem Relation Age of Onset  . Hypertension Father   . Arthritis Father     Social History: Social History   Tobacco Use  . Smoking status: Current Some Day Smoker    Packs/day: 0.25    Types: Cigarettes  . Smokeless tobacco: Never Used  . Tobacco comment: quit smoking 3 months  Substance Use Topics  . Alcohol use: No  . Drug use: No    Comment: subutex    Allergies:  Allergies  Allergen Reactions  . Chantix [Varenicline]     hives    Meds:  Medications Prior to Admission  Medication Sig Dispense Refill Last Dose  . buprenorphine (SUBUTEX) 8  MG SUBL SL tablet Place 4 mg under the tongue 2 (two) times daily.   0 07/17/2017 at Unknown time  . famotidine (PEPCID) 20 MG tablet Take 1 tablet (20 mg total) by mouth 2 (two) times daily. 60 tablet 2   . ibuprofen (ADVIL,MOTRIN) 800 MG tablet Take 1 tablet (800 mg total) by mouth every 8 (eight) hours. 30 tablet 0   . lidocaine (LIDODERM) 5 % Place 1 patch onto the skin daily. Remove & Discard patch within 12 hours or as directed by MD 2 patch 0   . oxyCODONE-acetaminophen (PERCOCET/ROXICET) 5-325 MG tablet Take 1 tablet by mouth every 4 (four) hours as needed (pain scale 4-7). 20 tablet 0   . Prenatal Vit-Fe Fumarate-FA (MULTIVITAMIN-PRENATAL) 27-0.8 MG TABS tablet Take 1 tablet by mouth daily.    07/17/2017 at Unknown time    I have reviewed patient's Past Medical Hx, Surgical Hx, Family Hx, Social Hx, medications and allergies.  ROS:  Review of Systems  Constitutional: Negative for chills and fever.  Gastrointestinal: Positive for vomiting. Negative for  abdominal pain and diarrhea.  Musculoskeletal: Negative for myalgias.  Neurological: Negative for dizziness and headaches.   Other systems negative     Physical Exam   Patient Vitals for the past 24 hrs:  BP Temp Temp src Pulse Resp SpO2 Height Weight  08/01/17 2049 115/78 - - (!) 104 - - - -  08/01/17 2030 (!) 140/115 97.6 F (36.4 C) Oral (!) 117 18 97 % 5\' 4"  (1.626 m) 133 lb (60.3 kg)   Constitutional: Well-developed, female in no acute distress.  Cardiovascular: slightly tachycardic, normal rhythm, no ectopy audible, S1 & S2 heard, no murmur Respiratory: normal effort, no distress. Lungs CTAB with no wheezes or crackles GI: Abd soft, non-tender, except over incision.  Nondistended.  No rebound, No guarding.  Bowel Sounds audible   Incision clean and intact.  Fundus palpable at umbilicus  Uterus is mildly tender, firm.  Not excessively tender. MS: Extremities nontender, no edema, normal ROM Neurologic: Alert and oriented x 4.   Grossly nonfocal. GU: Neg CVAT. Skin:  Warm and Dry Psych:  Affect appropriate.  PELVIC EXAM: deferred    Labs: --/--/B POS (06/17 1120) Results for orders placed or performed during the hospital encounter of 08/01/17 (from the past 24 hour(s))  Urinalysis, Routine w reflex microscopic     Status: Abnormal   Collection Time: 08/01/17  8:11 PM  Result Value Ref Range   Color, Urine YELLOW YELLOW   APPearance CLEAR CLEAR   Specific Gravity, Urine >1.030 (H) 1.005 - 1.030   pH 5.5 5.0 - 8.0   Glucose, UA NEGATIVE NEGATIVE mg/dL   Hgb urine dipstick LARGE (A) NEGATIVE   Bilirubin Urine SMALL (A) NEGATIVE   Ketones, ur NEGATIVE NEGATIVE mg/dL   Protein, ur NEGATIVE NEGATIVE mg/dL   Nitrite POSITIVE (A) NEGATIVE   Leukocytes, UA NEGATIVE NEGATIVE  Urinalysis, Microscopic (reflex)     Status: Abnormal   Collection Time: 08/01/17  8:11 PM  Result Value Ref Range   RBC / HPF 0-5 0 - 5 RBC/hpf   WBC, UA 0-5 0 - 5 WBC/hpf   Bacteria, UA RARE (A)  NONE SEEN   Squamous Epithelial / LPF 0-5 0 - 5  CBC with Differential/Platelet     Status: Abnormal   Collection Time: 08/01/17  9:44 PM  Result Value Ref Range   WBC 20.7 (H) 4.0 - 10.5 K/uL   RBC 4.78 3.87 - 5.11 MIL/uL  Hemoglobin 14.5 12.0 - 15.0 g/dL   HCT 42.3 36.0 - 46.0 %   MCV 88.5 78.0 - 100.0 fL   MCH 30.3 26.0 - 34.0 pg   MCHC 34.3 30.0 - 36.0 g/dL   RDW 13.0 11.5 - 15.5 %   Platelets 448 (H) 150 - 400 K/uL   Neutrophils Relative % 81 %   Neutro Abs 16.7 (H) 1.7 - 7.7 K/uL   Lymphocytes Relative 14 %   Lymphs Abs 2.9 0.7 - 4.0 K/uL   Monocytes Relative 4 %   Monocytes Absolute 0.9 0.1 - 1.0 K/uL   Eosinophils Relative 1 %   Eosinophils Absolute 0.2 0.0 - 0.7 K/uL   Basophils Relative 0 %   Basophils Absolute 0.0 0.0 - 0.1 K/uL  Comprehensive metabolic panel     Status: Abnormal   Collection Time: 08/01/17  9:44 PM  Result Value Ref Range   Sodium 134 (L) 135 - 145 mmol/L   Potassium 3.1 (L) 3.5 - 5.1 mmol/L   Chloride 90 (L) 98 - 111 mmol/L   CO2 28 22 - 32 mmol/L   Glucose, Bld 109 (H) 70 - 99 mg/dL   BUN 23 (H) 6 - 20 mg/dL   Creatinine, Ser 0.67 0.44 - 1.00 mg/dL   Calcium 9.0 8.9 - 10.3 mg/dL   Total Protein 7.3 6.5 - 8.1 g/dL   Albumin 3.3 (L) 3.5 - 5.0 g/dL   AST 23 15 - 41 U/L   ALT 20 0 - 44 U/L   Alkaline Phosphatase 97 38 - 126 U/L   Total Bilirubin 0.8 0.3 - 1.2 mg/dL   GFR calc non Af Amer >60 >60 mL/min   GFR calc Af Amer >60 >60 mL/min   Anion gap 16 (H) 5 - 15    Imaging:  No results found.  MAU Course/MDM: I have ordered labs as follows:  CBC and CMET.  UA.   WBC is elevated.  Urine has nitrites even though micro does not show significant bacteria.  Will send to culture and treat presumptively Imaging ordered: none  Consult Dr Corinna Capra with presentation, exam findings and results. He recommends hydration and antiemetics since she has bowel sounds and did have a BM yesterday.  .   Treatments in MAU included IV hydration and phenergan. .    Pt stable at time of discharge.  Assessment: Postoperative Day # 4 Nausea and vomiting Dehydration Nitrites in urine Elevated WBC, possible UTI  Plan: Discharge home Recommend Advancement of diet as tolerated Rx sent for Phenergan for nausea Rx sent for Keflex  for possible UTI, nitrites, elevated WBC  Encouraged to return here or to other Urgent Care/ED if she develops worsening of symptoms, increase in pain, fever, or other concerning symptoms.   Hansel Feinstein CNM, MSN Certified Nurse-Midwife 08/01/2017 9:08 PM

## 2017-08-01 NOTE — MAU Note (Signed)
Pt here with nausea and vomiting. Had C/S on 6/19.

## 2017-08-02 DIAGNOSIS — R112 Nausea with vomiting, unspecified: Secondary | ICD-10-CM | POA: Diagnosis not present

## 2017-08-02 MED ORDER — CEPHALEXIN 500 MG PO CAPS: 500.0000 mg | ORAL_CAPSULE | Freq: Four times a day (QID) | ORAL | 0 refills | Status: DC

## 2017-08-02 MED ORDER — PROMETHAZINE HCL 25 MG PO TABS: 25.0000 mg | ORAL_TABLET | Freq: Four times a day (QID) | ORAL | 2 refills | Status: DC | PRN

## 2017-08-02 NOTE — Discharge Instructions (Signed)
Urinary Tract Infection, Adult A urinary tract infection (UTI) is an infection of any part of the urinary tract. The urinary tract includes the:  Kidneys.  Ureters.  Bladder.  Urethra.  These organs make, store, and get rid of pee (urine) in the body. Follow these instructions at home:  Take over-the-counter and prescription medicines only as told by your doctor.  If you were prescribed an antibiotic medicine, take it as told by your doctor. Do not stop taking the antibiotic even if you start to feel better.  Avoid the following drinks: ? Alcohol. ? Caffeine. ? Tea. ? Carbonated drinks.  Drink enough fluid to keep your pee clear or pale yellow.  Keep all follow-up visits as told by your doctor. This is important.  Make sure to: ? Empty your bladder often and completely. Do not to hold pee for long periods of time. ? Empty your bladder before and after sex. ? Wipe from front to back after a bowel movement if you are female. Use each tissue one time when you wipe. Contact a doctor if:  You have back pain.  You have a fever.  You feel sick to your stomach (nauseous).  You throw up (vomit).  Your symptoms do not get better after 3 days.  Your symptoms go away and then come back. Get help right away if:  You have very bad back pain.  You have very bad lower belly (abdominal) pain.  You are throwing up and cannot keep down any medicines or water. This information is not intended to replace advice given to you by your health care provider. Make sure you discuss any questions you have with your health care provider. Document Released: 07/12/2007 Document Revised: 07/01/2015 Document Reviewed: 12/14/2014 Elsevier Interactive Patient Education  2018 [employer]. Nausea, Adult Nausea is the feeling of an upset stomach or having to vomit. Nausea on its own is not usually a serious concern, but it may be an early sign of a more serious medical problem. As nausea gets  worse, it can lead to vomiting. If vomiting develops, or if you are not able to drink enough fluids, you are at risk of becoming dehydrated. Dehydration can make you tired and thirsty, cause you to have a dry mouth, and decrease how often you urinate. Older adults and people with other diseases or a weak immune system are at higher risk for dehydration. The main goals of treating your nausea are:  To limit repeated nausea episodes.  To prevent vomiting and dehydration.  Follow these instructions at home: Follow instructions from your health care provider about how to care for yourself at home. Eating and drinking Follow these recommendations as told by your health care provider:  Take an oral rehydration solution (ORS). This is a drink that is sold at pharmacies and retail stores.  Drink clear fluids in small amounts as you are able. Clear fluids include water, ice chips, diluted fruit juice, and low-calorie sports drinks.  Eat bland, easy-to-digest foods in small amounts as you are able. These foods include bananas, applesauce, rice, lean meats, toast, and crackers.  Avoid drinking fluids that contain a lot of sugar or caffeine, such as energy drinks, sports drinks, and soda.  Avoid alcohol.  Avoid spicy or fatty foods.  General instructions  Drink enough fluid to keep your urine clear or pale yellow.  Wash your hands often. If soap and water are not available, use hand sanitizer.  Make sure that all people in your household  wash their hands well and often.  Rest at home while you recover.  Take over-the-counter and prescription medicines only as told by your health care provider.  Breathe slowly and deeply when you feel nauseous.  Watch your condition for any changes.  Keep all follow-up visits as told by your health care provider. This is important. Contact a health care provider if:  You have a headache.  You have new symptoms.  Your nausea gets worse.  You have a  fever.  You feel light-headed or dizzy.  You vomit.  You cannot keep fluids down. Get help right away if:  You have pain in your chest, neck, arm, or jaw.  You feel extremely weak or you faint.  You have vomit that is bright red or looks like coffee grounds.  You have bloody or black stools or stools that look like tar.  You have a severe headache, a stiff neck, or both.  You have severe pain, cramping, or bloating in your abdomen.  You have a rash.  You have difficulty breathing or are breathing very quickly.  Your heart is beating very quickly.  Your skin feels cold and clammy.  You feel confused.  You have pain when you urinate.  You have signs of dehydration, such as: ? Dark urine, very little, or no urine. ? Cracked lips. ? Dry mouth. ? Sunken eyes. ? Sleepiness. ? Weakness. These symptoms may represent a serious problem that is an emergency. Do not wait to see if the symptoms will go away. Get medical help right away. Call your local emergency services (911 in the [place] Do not drive yourself to the hospital. This information is not intended to replace advice given to you by your health care provider. Make sure you discuss any questions you have with your health care provider. Document Released: 03/02/2004 Document Revised: 06/28/2015 Document Reviewed: 09/29/2014 Elsevier Interactive Patient Education  [employer].

## 2017-08-03 LAB — URINE CULTURE: Culture: <10000 — AB

## 2017-08-06 HISTORY — PX: SMALL INTESTINE SURGERY: SHX150

## 2017-08-07 ENCOUNTER — Encounter (HOSPITAL_COMMUNITY): Payer: Self-pay

## 2017-08-07 ENCOUNTER — Other Ambulatory Visit: Payer: Self-pay

## 2017-08-07 ENCOUNTER — Inpatient Hospital Stay (HOSPITAL_COMMUNITY)
Admission: AD | Admit: 2017-08-07 | Discharge: 2017-08-07 | Disposition: A | Discharge status: Home / Self Care | Payer: 59 | Source: Ambulatory Visit | Attending: Obstetrics & Gynecology | Admitting: Obstetrics & Gynecology

## 2017-08-07 ENCOUNTER — Inpatient Hospital Stay (HOSPITAL_COMMUNITY): Payer: 59

## 2017-08-07 DIAGNOSIS — R140 Abdominal distension (gaseous): Secondary | ICD-10-CM | POA: Insufficient documentation

## 2017-08-07 DIAGNOSIS — R112 Nausea with vomiting, unspecified: Secondary | ICD-10-CM | POA: Diagnosis not present

## 2017-08-07 DIAGNOSIS — R933 Abnormal findings on diagnostic imaging of other parts of digestive tract: Secondary | ICD-10-CM

## 2017-08-07 DIAGNOSIS — O9089 Other complications of the puerperium, not elsewhere classified: Secondary | ICD-10-CM | POA: Diagnosis not present

## 2017-08-07 DIAGNOSIS — R399 Unspecified symptoms and signs involving the genitourinary system: Secondary | ICD-10-CM | POA: Diagnosis not present

## 2017-08-07 NOTE — MAU Note (Signed)
Pt presents to MAU with order from [facility] office for a Abdominal X-ray for pain and vomiting. Pt delivered on 07/25/17, c-section.

## 2017-08-07 NOTE — MAU Note (Signed)
Pt had understanding that the [place], MD wanted her to have an outpatient abdominal X-ray. Checked into Maternity admissions by error. RN walked pt to X-ray department and verbalized to tech to follow Matthew Saras, MD's orders on paper order and to contact Dr. Lynnette Caffey to give X-ray results.

## 2017-08-08 ENCOUNTER — Emergency Department (HOSPITAL_COMMUNITY)
Admission: EM | Admit: 2017-08-08 | Discharge: 2017-08-20 | Disposition: A | Discharge status: Other Acute Inpatient Hospital | Payer: 59 | Attending: Emergency Medicine | Admitting: Emergency Medicine

## 2017-08-08 ENCOUNTER — Inpatient Hospital Stay (HOSPITAL_COMMUNITY)
Admission: AD | Admit: 2017-08-08 | Discharge: 2017-08-14 | DRG: 769 | Disposition: A | Discharge status: Home / Self Care | Payer: 59 | Source: Ambulatory Visit | Attending: Internal Medicine | Admitting: Internal Medicine

## 2017-08-08 ENCOUNTER — Inpatient Hospital Stay (HOSPITAL_COMMUNITY): Payer: 59

## 2017-08-08 ENCOUNTER — Other Ambulatory Visit: Payer: Self-pay

## 2017-08-08 ENCOUNTER — Encounter (HOSPITAL_COMMUNITY): Payer: Self-pay

## 2017-08-08 DIAGNOSIS — R109 Unspecified abdominal pain: Secondary | ICD-10-CM | POA: Diagnosis not present

## 2017-08-08 DIAGNOSIS — O900 Disruption of cesarean delivery wound: Secondary | ICD-10-CM | POA: Diagnosis present

## 2017-08-08 DIAGNOSIS — K56699 Other intestinal obstruction unspecified as to partial versus complete obstruction: Secondary | ICD-10-CM | POA: Diagnosis not present

## 2017-08-08 DIAGNOSIS — K56609 Unspecified intestinal obstruction, unspecified as to partial versus complete obstruction: Secondary | ICD-10-CM

## 2017-08-08 DIAGNOSIS — F1910 Other psychoactive substance abuse, uncomplicated: Secondary | ICD-10-CM | POA: Diagnosis not present

## 2017-08-08 DIAGNOSIS — Z4682 Encounter for fitting and adjustment of non-vascular catheter: Secondary | ICD-10-CM | POA: Diagnosis not present

## 2017-08-08 DIAGNOSIS — O902 Hematoma of obstetric wound: Secondary | ICD-10-CM | POA: Diagnosis present

## 2017-08-08 DIAGNOSIS — F17210 Nicotine dependence, cigarettes, uncomplicated: Secondary | ICD-10-CM | POA: Diagnosis present

## 2017-08-08 DIAGNOSIS — G8929 Other chronic pain: Secondary | ICD-10-CM | POA: Diagnosis present

## 2017-08-08 DIAGNOSIS — K219 Gastro-esophageal reflux disease without esophagitis: Secondary | ICD-10-CM | POA: Diagnosis present

## 2017-08-08 DIAGNOSIS — F1120 Opioid dependence, uncomplicated: Secondary | ICD-10-CM | POA: Diagnosis present

## 2017-08-08 DIAGNOSIS — Z888 Allergy status to other drugs, medicaments and biological substances status: Secondary | ICD-10-CM | POA: Diagnosis not present

## 2017-08-08 DIAGNOSIS — E873 Alkalosis: Secondary | ICD-10-CM | POA: Diagnosis not present

## 2017-08-08 DIAGNOSIS — Z9049 Acquired absence of other specified parts of digestive tract: Secondary | ICD-10-CM

## 2017-08-08 DIAGNOSIS — E876 Hypokalemia: Secondary | ICD-10-CM | POA: Diagnosis not present

## 2017-08-08 DIAGNOSIS — K559 Vascular disorder of intestine, unspecified: Secondary | ICD-10-CM | POA: Diagnosis present

## 2017-08-08 DIAGNOSIS — R112 Nausea with vomiting, unspecified: Secondary | ICD-10-CM | POA: Diagnosis not present

## 2017-08-08 DIAGNOSIS — K430 Incisional hernia with obstruction, without gangrene: Secondary | ICD-10-CM

## 2017-08-08 DIAGNOSIS — E860 Dehydration: Secondary | ICD-10-CM | POA: Diagnosis not present

## 2017-08-08 DIAGNOSIS — K55019 Acute (reversible) ischemia of small intestine, extent unspecified: Secondary | ICD-10-CM | POA: Diagnosis not present

## 2017-08-08 DIAGNOSIS — D62 Acute posthemorrhagic anemia: Secondary | ICD-10-CM | POA: Diagnosis not present

## 2017-08-08 DIAGNOSIS — O9089 Other complications of the puerperium, not elsewhere classified: Secondary | ICD-10-CM | POA: Diagnosis not present

## 2017-08-08 DIAGNOSIS — D72829 Elevated white blood cell count, unspecified: Secondary | ICD-10-CM | POA: Diagnosis present

## 2017-08-08 DIAGNOSIS — Z8261 Family history of arthritis: Secondary | ICD-10-CM | POA: Diagnosis not present

## 2017-08-08 DIAGNOSIS — R1110 Vomiting, unspecified: Secondary | ICD-10-CM | POA: Diagnosis not present

## 2017-08-08 DIAGNOSIS — Z981 Arthrodesis status: Secondary | ICD-10-CM

## 2017-08-08 DIAGNOSIS — Z8249 Family history of ischemic heart disease and other diseases of the circulatory system: Secondary | ICD-10-CM | POA: Diagnosis not present

## 2017-08-08 DIAGNOSIS — T8130XA Disruption of wound, unspecified, initial encounter: Secondary | ICD-10-CM

## 2017-08-08 DIAGNOSIS — IMO0002 Reserved for concepts with insufficient information to code with codable children: Secondary | ICD-10-CM

## 2017-08-08 DIAGNOSIS — M549 Dorsalgia, unspecified: Secondary | ICD-10-CM | POA: Diagnosis not present

## 2017-08-08 DIAGNOSIS — R9431 Abnormal electrocardiogram [ECG] [EKG]: Secondary | ICD-10-CM | POA: Diagnosis not present

## 2017-08-08 DIAGNOSIS — E871 Hypo-osmolality and hyponatremia: Secondary | ICD-10-CM | POA: Diagnosis not present

## 2017-08-08 DIAGNOSIS — K660 Peritoneal adhesions (postprocedural) (postinfection): Secondary | ICD-10-CM | POA: Diagnosis not present

## 2017-08-08 DIAGNOSIS — Z98891 History of uterine scar from previous surgery: Secondary | ICD-10-CM

## 2017-08-08 DIAGNOSIS — O99335 Smoking (tobacco) complicating the puerperium: Secondary | ICD-10-CM | POA: Diagnosis not present

## 2017-08-08 DIAGNOSIS — Z720 Tobacco use: Secondary | ICD-10-CM | POA: Diagnosis present

## 2017-08-08 DIAGNOSIS — K565 Intestinal adhesions [bands], unspecified as to partial versus complete obstruction: Secondary | ICD-10-CM | POA: Diagnosis not present

## 2017-08-08 DIAGNOSIS — K5669 Other partial intestinal obstruction: Secondary | ICD-10-CM | POA: Diagnosis not present

## 2017-08-08 DIAGNOSIS — T8130XD Disruption of wound, unspecified, subsequent encounter: Secondary | ICD-10-CM | POA: Diagnosis not present

## 2017-08-08 LAB — CBC WITH DIFFERENTIAL/PLATELET
Basophils Absolute: 0 10*3/uL (ref 0.0–0.1)
Basophils Relative: 0 %
EOS ABS: 0.2 10*3/uL (ref 0.0–0.7)
Eosinophils Relative: 1 %
HCT: 38.7 % (ref 36.0–46.0)
Hemoglobin: 13.2 g/dL (ref 12.0–15.0)
LYMPHS PCT: 14 %
Lymphs Abs: 2.3 10*3/uL (ref 0.7–4.0)
MCH: 30.1 pg (ref 26.0–34.0)
MCHC: 34.1 g/dL (ref 30.0–36.0)
MCV: 88.2 fL (ref 78.0–100.0)
MONO ABS: 1.6 10*3/uL — AB (ref 0.1–1.0)
Monocytes Relative: 10 %
NEUTROS PCT: 75 %
Neutro Abs: 12.2 10*3/uL — ABNORMAL HIGH (ref 1.7–7.7)
PLATELETS: 352 10*3/uL (ref 150–400)
RBC: 4.39 MIL/uL (ref 3.87–5.11)
RDW: 12.6 % (ref 11.5–15.5)
WBC: 16.3 10*3/uL — AB (ref 4.0–10.5)

## 2017-08-08 LAB — COMPREHENSIVE METABOLIC PANEL
ALT: 22 U/L (ref 0–44)
AST: 16 U/L (ref 15–41)
Albumin: 3.6 g/dL (ref 3.5–5.0)
Alkaline Phosphatase: 104 U/L (ref 38–126)
Anion gap: 24 — ABNORMAL HIGH (ref 5–15)
BUN: 22 mg/dL — AB (ref 6–20)
CHLORIDE: 78 mmol/L — AB (ref 98–111)
CO2: 31 mmol/L (ref 22–32)
CREATININE: 0.71 mg/dL (ref 0.44–1.00)
Calcium: 8.9 mg/dL (ref 8.9–10.3)
GFR calc Af Amer: >60 mL/min (ref >60)
GFR calc non Af Amer: >60 mL/min (ref >60)
Glucose, Bld: 122 mg/dL — ABNORMAL HIGH (ref 70–99)
Potassium: 2.2 mmol/L — CL (ref 3.5–5.1)
SODIUM: 133 mmol/L — AB (ref 135–145)
Total Bilirubin: 0.4 mg/dL (ref 0.3–1.2)
Total Protein: 7.4 g/dL (ref 6.5–8.1)

## 2017-08-08 LAB — LIPASE, BLOOD: Lipase: 36 U/L (ref 11–51)

## 2017-08-08 MED ORDER — GI COCKTAIL ~~LOC~~
30.0000 mL | Freq: Once | ORAL | Status: AC
  Administered 2017-08-08: 30 mL via ORAL
  Filled 2017-08-08: qty 30

## 2017-08-08 MED ORDER — LACTATED RINGERS IV BOLUS
2000.0000 mL | Freq: Once | INTRAVENOUS | Status: AC
  Administered 2017-08-08: 2000 mL via INTRAVENOUS

## 2017-08-08 MED ORDER — POTASSIUM CHLORIDE CRYS ER 20 MEQ PO TBCR
40.0000 meq | EXTENDED_RELEASE_TABLET | Freq: Once | ORAL | Status: AC
  Administered 2017-08-08: 40 meq via ORAL
  Filled 2017-08-08: qty 2

## 2017-08-08 MED ORDER — MAGNESIUM OXIDE 400 (241.3 MG) MG PO TABS
800.0000 mg | ORAL_TABLET | Freq: Once | ORAL | Status: AC
  Administered 2017-08-08: 800 mg via ORAL
  Filled 2017-08-08: qty 2

## 2017-08-08 MED ORDER — ONDANSETRON HCL 4 MG/2ML IJ SOLN
4.0000 mg | Freq: Once | INTRAMUSCULAR | Status: AC
  Administered 2017-08-08: 4 mg via INTRAVENOUS
  Filled 2017-08-08: qty 2

## 2017-08-08 MED ORDER — POTASSIUM CHLORIDE 10 MEQ/100ML IV SOLN
10.0000 meq | INTRAVENOUS | Status: AC
  Administered 2017-08-08 – 2017-08-09 (×5): 10 meq via INTRAVENOUS
  Filled 2017-08-08 (×6): qty 100

## 2017-08-08 MED ORDER — LIDOCAINE HCL URETHRAL/MUCOSAL 2 % EX GEL
1.0000 "application " | Freq: Once | CUTANEOUS | Status: AC
  Administered 2017-08-09: 1
  Filled 2017-08-08: qty 5

## 2017-08-08 MED ORDER — IOPAMIDOL (ISOVUE-300) INJECTION 61%
INTRAVENOUS | Status: AC
  Filled 2017-08-08: qty 100

## 2017-08-08 MED ORDER — IOPAMIDOL (ISOVUE-300) INJECTION 61%
100.0000 mL | Freq: Once | INTRAVENOUS | Status: AC | PRN
  Administered 2017-08-08: 100 mL via INTRAVENOUS

## 2017-08-08 NOTE — ED Notes (Signed)
EKG given to EDP,Yao,MD., for review. 

## 2017-08-08 NOTE — ED Provider Notes (Signed)
[place] DEPT Provider Note   CSN: [id number] Arrival date & time: 08/08/17  1542     History   Chief Complaint Chief Complaint  Patient presents with  . Abdominal Pain    HPI [name] is a 42 y.o. female.  42 yo F with a chief complaint of abdominal pain nausea and vomiting.  This been going on since she had her C-section.  She is to eat something and then about an hour later will become very sick have crampy abdominal pain and vomit.  She describes it as green.  Has had a couple bowel movement since she was discharged.  Still passing flatus.  Denies fevers or chills.  Denies any pain currently.  Denies nausea currently.  Only occurs after she eats.  Is not been able to keep anything down.  Is concerned because she has had a significant amount of weight loss.  The patient was to be direct admitted by her OB/GYN but unfortunately there was a bed issue and she was put in the MAU and then sent here for evaluation.  The history is provided by the patient.  Abdominal Pain   This is a new problem. The current episode started more than 1 week ago. The problem occurs constantly. The problem has not changed since onset.Associated symptoms include nausea and vomiting. Pertinent negatives include fever, dysuria, headaches, arthralgias and myalgias.    Past Medical History:  Diagnosis Date  . AMA (advanced maternal age) multigravida 63+   . Arthritis   . Chronic back pain   . Depression    no meds   . Hx of eating disorder   . Numbness    right leg  . Substance abuse ([place])    since 2009    Patient Active Problem List   Diagnosis Date Noted  . SBO (small bowel obstruction) ([place]) 08/09/2017  . Tobacco abuse 08/09/2017  . GERD (gastroesophageal reflux disease) 08/09/2017  . Hypokalemia 08/09/2017  . Substance abuse ([place])   . Pregnancy 07/25/2017  . Vaginal bleeding in pregnancy, second trimester 04/06/2017  . IUGR (intrauterine growth  restriction) 02/28/2015  . S/P cesarean section 02/28/2015  . Radiculopathy 06/04/2014    Past Surgical History:  Procedure Laterality Date  . BREAST SURGERY     augmentation  . BUNIONECTOMY Bilateral    screws  . CESAREAN SECTION N/A 02/28/2015   Procedure: CESAREAN SECTION;  Surgeon: Linda Hedges, DO;  Location: [facility];  Service: Obstetrics;  Laterality: N/A;  . CESAREAN SECTION N/A 07/25/2017   Procedure: REPEAT CESAREAN SECTION;  Surgeon: Molli Posey, MD;  Location: [facility];  Service: Obstetrics;  Laterality: N/A;  Repeat edc 08/18/17 NKDA  . CHOLECYSTECTOMY    . DIAGNOSTIC LAPAROSCOPY    . I fuse     second back surgery states this is the name of the procedure  . LUMBAR FUSION       OB History    Gravida  5   Para  3   Term  2   Preterm  1   AB  2   Living  3     SAB  1   TAB  1   Ectopic      Multiple  0   Live Births  3            Home Medications    Prior to Admission medications   Medication Sig Start Date End Date Taking? Authorizing Provider  buprenorphine (SUBUTEX) 8 MG  SUBL SL tablet Place 8 mg under the tongue 2 (two) times daily.  09/25/14  Yes [provider]  cephALEXin (KEFLEX) 500 MG capsule Take 1 capsule (500 mg total) by mouth 4 (four) times daily. 08/02/17  Yes Seabron Spates, CNM  famotidine (PEPCID) 20 MG tablet Take 1 tablet (20 mg total) by mouth 2 (two) times daily. Patient not taking: Reported on 08/08/2017 07/28/17   Marylynn Pearson, MD  ibuprofen (ADVIL,MOTRIN) 800 MG tablet Take 1 tablet (800 mg total) by mouth every 8 (eight) hours. Patient not taking: Reported on 08/08/2017 07/28/17   Marylynn Pearson, MD  lidocaine (LIDODERM) 5 % Place 1 patch onto the skin daily. Remove & Discard patch within 12 hours or as directed by MD Patient not taking: Reported on 08/08/2017 07/28/17   Marylynn Pearson, MD  oxyCODONE-acetaminophen (PERCOCET/ROXICET) 5-325 MG tablet Take 1 tablet by mouth every 4 (four) hours as  needed (pain scale 4-7). Patient not taking: Reported on 08/08/2017 07/28/17   Marylynn Pearson, MD  promethazine (PHENERGAN) 25 MG tablet Take 1 tablet (25 mg total) by mouth every 6 (six) hours as needed for nausea or vomiting. Patient not taking: Reported on 08/08/2017 08/02/17   Seabron Spates, CNM    Family History Family History  Problem Relation Age of Onset  . Hypertension Father   . Arthritis Father     Social History Social History   Tobacco Use  . Smoking status: Current Some Day Smoker    Packs/day: 0.25    Types: Cigarettes  . Smokeless tobacco: Never Used  . Tobacco comment: quit smoking 3 months  Substance Use Topics  . Alcohol use: No  . Drug use: No    Comment: subutex     Allergies   Chantix [varenicline]   Review of Systems Review of Systems  Constitutional: Negative for chills and fever.  HENT: Negative for congestion and rhinorrhea.   Eyes: Negative for redness and visual disturbance.  Respiratory: Negative for shortness of breath and wheezing.   Cardiovascular: Negative for chest pain and palpitations.  Gastrointestinal: Positive for abdominal pain, nausea and vomiting.  Genitourinary: Negative for dysuria and urgency.  Musculoskeletal: Negative for arthralgias and myalgias.  Skin: Negative for pallor and wound.  Neurological: Negative for dizziness and headaches.     Physical Exam Updated Vital Signs BP 109/74   Pulse 87   Temp 98.3 F (36.8 C) (Oral)   Resp 10   Ht 5\' 4"  (1.626 m)   Wt 55.8 kg (123 lb)   LMP  (LMP Unknown)   SpO2 96%   Breastfeeding? No   BMI 21.11 kg/m   Physical Exam  Constitutional: She is oriented to person, place, and time. She appears well-developed and well-nourished. No distress.  HENT:  Head: Normocephalic and atraumatic.  Eyes: Pupils are equal, round, and reactive to light. EOM are normal.  Neck: Normal range of motion. Neck supple.  Cardiovascular: Normal rate and regular rhythm. Exam reveals no  gallop and no friction rub.  No murmur heard. Pulmonary/Chest: Effort normal. She has no wheezes. She has no rales.  Abdominal: Soft. She exhibits no distension and no mass. There is no tenderness. There is no guarding.  Low riding scar with no erythema drainage or tenderness.  Musculoskeletal: She exhibits no edema or tenderness.  Neurological: She is alert and oriented to person, place, and time.  Skin: Skin is warm and dry. She is not diaphoretic.  Psychiatric: She has a normal mood and affect. Her  behavior is normal.  Nursing note and vitals reviewed.    ED Treatments / Results  Labs (all labs ordered are listed, but only abnormal results are displayed) Labs Reviewed  CBC WITH DIFFERENTIAL/PLATELET  URINALYSIS, ROUTINE W REFLEX MICROSCOPIC  COMPREHENSIVE METABOLIC PANEL  MAGNESIUM    EKG None  Radiology Ct Abdomen Pelvis W Contrast  Result Date: 08/08/2017 CLINICAL DATA:  42 year old female with acute abdominal pain and vomiting. C-section on 07/25/2017. EXAM: CT ABDOMEN AND PELVIS WITH CONTRAST TECHNIQUE: Multidetector CT imaging of the abdomen and pelvis was performed using the standard protocol following bolus administration of intravenous contrast. CONTRAST:  160mL ISOVUE-300 IOPAMIDOL (ISOVUE-300) INJECTION 61% COMPARISON:  08/07/2017 radiographs.  05/20/2007 CT FINDINGS: Lower chest: No acute abnormality. Hepatobiliary: The liver is unremarkable. Patient is status post cholecystectomy. CBD dilatation to the ampulla is noted and measures 12 mm. Pancreas: Unremarkable Spleen: Unremarkable Adrenals/Urinary Tract: The kidneys, adrenal glands and bladder are unremarkable. Stomach/Bowel: Dilated proximal and mid small bowel loops with collapsed distal small bowel loops are compatible with a high-grade small bowel obstruction. The transition point is likely located within the anterior pelvis which there is a suggestion of anterior abdominal/pelvic wall eventration or hernia. Fluid  within the eventration/hernia sac noted. There is no evidence of pneumoperitoneum.  No abscess is identified. Vascular/Lymphatic: Aortic atherosclerosis. No enlarged abdominal or pelvic lymph nodes. Reproductive: Enlarged uterus with C-section changes noted. Other: No ascites noted. Musculoskeletal: No acute or suspicious bony abnormalities noted. LOWER lumbar and RIGHT SI joint effusion hardware identified. IMPRESSION: 1. High-grade small bowel obstruction with transition point likely within an anterior abdominal/pelvic wall eventration or hernia sac. No evidence of pneumoperitoneum. 2. Enlarged postpartum uterus with C-section changes. 3. CBD dilatation which could be related to small bowel obstruction. Electronically Signed   By: Margarette Canada M.D.   On: 08/08/2017 23:26   Dg Abd 2 Views  Result Date: 08/07/2017 CLINICAL DATA:  Abdominal bloating. Vomiting. Caesarean section on 07/25/2017. EXAM: ABDOMEN - 2 VIEW COMPARISON:  CT abdomen and pelvis 05/20/2007 FINDINGS: There is no evidence of intraperitoneal free air. There are multiple loops of dilated small bowel in the central and left abdomen measuring up to approximately 6 cm in diameter with some air-fluid levels. Stool and a small amount of gas are present in the colon. Sequelae of prior lumbar and right sacroiliac fusion are noted. Surgical clips are present in the right upper abdomen. The visualized lung bases are clear. IMPRESSION: Moderate small bowel dilatation concerning for obstruction. These results will be called to the ordering clinician or representative by the Radiology Department at the imaging location. Electronically Signed   By: Logan Bores M.D.   On: 08/07/2017 18:12    Procedures Procedures (including critical care time)  Medications Ordered in ED Medications  potassium chloride 10 mEq in 100 mL IVPB (10 mEq Intravenous New Bag/Given 08/08/17 2230)  lidocaine (XYLOCAINE) 2 % jelly 1 application (has no administration in time range)   ondansetron (ZOFRAN) injection 4 mg (4 mg Intravenous Given 08/08/17 2046)  lactated ringers bolus 2,000 mL (0 mLs Intravenous Stopped 08/08/17 2300)  gi cocktail (Maalox,Lidocaine,Donnatal) (30 mLs Oral Given 08/08/17 2046)  potassium chloride SA (K-DUR,KLOR-CON) CR tablet 40 mEq (40 mEq Oral Given 08/08/17 2230)  magnesium oxide (MAG-OX) tablet 800 mg (800 mg Oral Given 08/08/17 2210)  iopamidol (ISOVUE-300) 61 % injection 100 mL (100 mLs Intravenous Contrast Given 08/08/17 2305)     Initial Impression / Assessment and Plan / ED Course  I have  reviewed the triage vital signs and the nursing notes.  Pertinent labs & imaging results that were available during my care of the patient were reviewed by me and considered in my medical decision making (see chart for details).     42 yo F with a chief complaint of persistent nausea and vomiting post C-section.  She is complaining of some bilious vomiting.  There is a plain film done by the OB/GYN that was concerning for a possible small bowel obstruction.  Will obtain a CT.  We will give IV fluids.  Found to be significantly hypokalemic with a potassium of 2.2.  Will replenish.  I discussed the case with Gen surgery, [place].  He will consult on the patient but felt that it would be best for OBGYN to be involved as the patient is about 2 weeks post op.  I discussed the case with Dr. Corinna Capra, OBGYN on call for [facility], as the hospital is full at womens, and he did not feel that this was something that is typically managed by OBGYN he recommended admission at [facility].  I discussed with him that they would be expected to help in the management of the patient if needed.   Will discuss with the hospitalist.   The patients results and plan were reviewed and discussed.   Any x-rays performed were independently reviewed by myself.   Differential diagnosis were considered with the presenting HPI.  Medications  potassium chloride 10 mEq  in 100 mL IVPB (10 mEq Intravenous New Bag/Given 08/08/17 2230)  lidocaine (XYLOCAINE) 2 % jelly 1 application (has no administration in time range)  ondansetron (ZOFRAN) injection 4 mg (4 mg Intravenous Given 08/08/17 2046)  lactated ringers bolus 2,000 mL (0 mLs Intravenous Stopped 08/08/17 2300)  gi cocktail (Maalox,Lidocaine,Donnatal) (30 mLs Oral Given 08/08/17 2046)  potassium chloride SA (K-DUR,KLOR-CON) CR tablet 40 mEq (40 mEq Oral Given 08/08/17 2230)  magnesium oxide (MAG-OX) tablet 800 mg (800 mg Oral Given 08/08/17 2210)  iopamidol (ISOVUE-300) 61 % injection 100 mL (100 mLs Intravenous Contrast Given 08/08/17 2305)    Vitals:   08/08/17 1945 08/08/17 1947 08/08/17 1956 08/08/17 2315  BP: 99/78   109/74  Pulse: 96   87  Resp: 16   10  Temp: 98.3 F (36.8 C)     TempSrc: Oral     SpO2: 93% 95%  96%  Weight:   55.8 kg (123 lb)   Height:   5\' 4"  (1.626 m)     Final diagnoses:  Small bowel obstruction ([facility])  S/P cesarean section    Admission/ observation were discussed with the admitting physician, patient and/or family and they are comfortable with the plan.    Final Clinical Impressions(s) / ED Diagnoses   Final diagnoses:  Small bowel obstruction [facility])  S/P cesarean section    ED Discharge Orders    None       Deno Etienne, DO 08/09/17 0008

## 2017-08-08 NOTE — ED Triage Notes (Signed)
Patient arrived via [facility] from [facility] with bowel obstruction. Patient has no c/o pain at this time. [facility] states she feels like her belly is full of gas. Per [facility], patient states she has been experiencing intermittent sudden onset vomiting w/out nausea. She had a C-section on 07-25-17 and has been experiencing N/V/abdominal pain since. Xray abdomen yesterday showed bowel obstruction. MD requested she return to hospital that day but did not go until today.

## 2017-08-08 NOTE — MAU Provider Note (Signed)
History     CSN: [id number]  Arrival date and time: 08/08/17 1542   First Provider Initiated Contact with Patient 08/08/17 1822      Chief Complaint  Patient presents with  . Abdominal Pain   HPI  [name] is a 42 y.o. year old [id number] female s/p RLTCS on 07/25/17 who was brought to MAU from [facility] unit for evaluation. She reports that she has been "unable to keep anything down since 07/24/17 and she weighs less than she did before she got pregnant". She was seen at [facility] on 08/07/17 by Dr. Matthew Saras and was sent here for abdominal x-ray. She was then called by Dr. Lynnette Caffey around [phone] last night and advised to come back to [facility] for admission d/t x-ray results. She verified that Dr. Lynnette Caffey did not want her to go to the GI doctor and she told her "no, [facility]". She was not able to come until today and now was told "there are no rooms left" for her.   Past Medical History:  Diagnosis Date  . AMA (advanced maternal age) multigravida 21+   . Arthritis   . Chronic back pain   . Depression    no meds   . Hx of eating disorder   . Numbness    right leg  . Substance abuse ([place])    since 2009    Past Surgical History:  Procedure Laterality Date  . BREAST SURGERY     augmentation  . BUNIONECTOMY Bilateral    screws  . CESAREAN SECTION N/A 02/28/2015   Procedure: CESAREAN SECTION;  Surgeon: Linda Hedges, DO;  Location: [facility];  Service: Obstetrics;  Laterality: N/A;  . CESAREAN SECTION N/A 07/25/2017   Procedure: REPEAT CESAREAN SECTION;  Surgeon: Molli Posey, MD;  Location: [facility];  Service: Obstetrics;  Laterality: N/A;  Repeat edc 08/18/17 NKDA  . CHOLECYSTECTOMY    . DIAGNOSTIC LAPAROSCOPY    . I fuse     second back surgery states this is the name of the procedure  . LUMBAR FUSION      Family History  Problem Relation Age of Onset  . Hypertension Father   . Arthritis Father     Social History   Tobacco Use  . Smoking status:  Current Some Day Smoker    Packs/day: 0.25    Types: Cigarettes  . Smokeless tobacco: Never Used  . Tobacco comment: quit smoking 3 months  Substance Use Topics  . Alcohol use: No  . Drug use: No    Comment: subutex    Allergies:  Allergies  Allergen Reactions  . Chantix [Varenicline]     hives    Medications Prior to Admission  Medication Sig Dispense Refill Last Dose  . buprenorphine (SUBUTEX) 8 MG SUBL SL tablet Place 4 mg under the tongue 2 (two) times daily.   0 07/17/2017 at Unknown time  . cephALEXin (KEFLEX) 500 MG capsule Take 1 capsule (500 mg total) by mouth 4 (four) times daily. 28 capsule 0   . famotidine (PEPCID) 20 MG tablet Take 1 tablet (20 mg total) by mouth 2 (two) times daily. 60 tablet 2   . ibuprofen (ADVIL,MOTRIN) 800 MG tablet Take 1 tablet (800 mg total) by mouth every 8 (eight) hours. 30 tablet 0   . lidocaine (LIDODERM) 5 % Place 1 patch onto the skin daily. Remove & Discard patch within 12 hours or as directed by MD 2 patch 0   .  oxyCODONE-acetaminophen (PERCOCET/ROXICET) 5-325 MG tablet Take 1 tablet by mouth every 4 (four) hours as needed (pain scale 4-7). 20 tablet 0   . Prenatal Vit-Fe Fumarate-FA (MULTIVITAMIN-PRENATAL) 27-0.8 MG TABS tablet Take 1 tablet by mouth daily.    07/17/2017 at Unknown time  . promethazine (PHENERGAN) 25 MG tablet Take 1 tablet (25 mg total) by mouth every 6 (six) hours as needed for nausea or vomiting. 30 tablet 2     Review of Systems  Constitutional: Positive for appetite change, fatigue and unexpected weight change ("weigh less than pre-pregnancy").  HENT: Negative.   Eyes: Negative.   Respiratory: Negative.   Cardiovascular: Negative.   Gastrointestinal: Positive for abdominal distention, abdominal pain, nausea and vomiting. Negative for constipation.       "weird BMs, but diarrhea or constipation"  Endocrine: Negative.   Genitourinary: Negative.   Musculoskeletal: Negative.   Skin: Negative.    Allergic/Immunologic: Negative.   Neurological: Positive for weakness.  Hematological: Negative.   Psychiatric/Behavioral: Negative.    Physical Exam   Blood pressure 116/85, pulse (!) 121, temperature 97.7 F (36.5 C), temperature source Oral, resp. rate 18, SpO2 95 %.  Physical Exam  Nursing note and vitals reviewed. Constitutional: She is oriented to person, place, and time. She appears well-developed. She appears lethargic. She has a sickly appearance. She appears distressed.  HENT:  Head: Normocephalic and atraumatic.  Eyes: Pupils are equal, round, and reactive to light.  Neck: Normal range of motion.  Cardiovascular: Normal rate, regular rhythm and normal heart sounds.  Respiratory: Effort normal and breath sounds normal.  GI: Soft. Bowel sounds are normal. There is generalized tenderness. There is no rigidity, no rebound and no guarding.  Genitourinary:  Genitourinary Comments: Pelvic deferred  Musculoskeletal: Normal range of motion.  Neurological: She is oriented to person, place, and time. She appears lethargic.  Skin: Skin is warm and dry. There is pallor.  Psychiatric: She has a normal mood and affect. Her behavior is normal. Judgment and thought content normal.    MAU Course  Procedures  MDM Reviewed X-Ray results *Consult with Dr. Corinna Capra @ [phone] - notified of patient's complaints, assessments - recommended tx plan: [facility] to [facility] *Dr. Deno Etienne @ [phone] - notified of patient's complaints, assessments & results -- Dr. Tyrone Nine is accepting care to evaluate pt for possible bowel obstruction  Assessment and Plan  Small bowel obstruction ([place])  S/P cesarean section - Transfer to [facility] for evaluation and possible admission  Laury Deep, MSN, CNM 08/08/2017, 6:22 PM

## 2017-08-08 NOTE — ED Triage Notes (Signed)
Patient had CT abdomen instead of an xray.

## 2017-08-08 NOTE — H&P (Addendum)
History and Physical    [name] TDV:[id number] DOB: 1975-10-20 DOA: 08/08/2017  Referring MD/NP/PA:   PCP: Kathyrn Lass, MD   Patient coming from:  The patient is coming from home.  At baseline, pt is independent for most of ADL.       Chief Complaint: Nausea, vomiting, abdominal pain  HPI: [name] is a 42 y.o. female with medical history significant of depression, GERD, tobacco abuse, substance abuse, s/p of C section on 07/25/17 (not doing breast-feeding currently) who presents with nausea, vomiting, abdominal pain.   Patient states that her abdominal pain started in the same day after she had C-section. It has worsened today.,  Without blood in the vomitus.  She has nausea and vomited couple of times. She states that she is not quite sure when her last bowel movement was, but states that she had a couple bowel movements since she was discharged. She states that her abdominal pain is constant, severe, cramping, sharp, nonradiating an aggravated by eating food. She also has abdominal distention and flatus.  No diarrhea, fever or chills.  Patient denies chest pain, shortness breath, cough.  No symptoms of UTI.  Patient states that she was given prescription for Keflex for possible UTI at the discharge. She had UA on 08/01/17 which showed positive nitrite, negative leukocytosis, WBC 0-5, rare bacteria and clear appearance of urine. Her UA has <10,000 colonies. Pt had abdominal plain x-ray on 7/2, which showed small bowel obstruction.  She was asked to return to [facility] hospital today for further evaluation. Per EDP, "the patient was to be direct admitted by her OB/GYN, but unfortunately there was bed issue and she was put in the MAU and then sent here for evaluation".   ED Course: pt was found to have potassium 2.2, creatinine normal, WBC 16.3, lipase 36, normal LFT, temperature normal, heart rate is 121, no tachypnea, oxygen sat 96% on room air.  Patient is admitted to telemetry bed as  inpatient.  General surgeon, Dr. Excell Seltzer was consulted.  CT abdomen/pelvis showed: 1. High-grade SBO with transition point likely within an anterior abdominal/pelvic wall eventration or hernia sac. No evidence of pneumoperitoneum. 2. Enlarged postpartum uterus with C-section changes. 3. CBD dilatation which could be related to small bowel obstruction.   Review of Systems:   General: no fevers, chills, has poor appetite, has fatigue HEENT: no blurry vision, hearing changes or sore throat Respiratory: no dyspnea, coughing, wheezing CV: no chest pain, no palpitations GI: has nausea, vomiting, abdominal pain, no diarrhea, constipation GU: no dysuria, burning on urination, increased urinary frequency, hematuria  Ext: no leg edema Neuro: no unilateral weakness, numbness, or tingling, no vision change or hearing loss Skin: no rash, no skin tear. MSK: No muscle spasm, no deformity, no limitation of range of movement in spin Heme: No easy bruising.  Travel history: No recent long distant travel.  Allergy:  Allergies  Allergen Reactions  . Chantix [Varenicline]     hives    Past Medical History:  Diagnosis Date  . AMA (advanced maternal age) multigravida 74+   . Arthritis   . Chronic back pain   . Depression    no meds   . Hx of eating disorder   . Numbness    right leg  . Substance abuse ([place])    since 2009    Past Surgical History:  Procedure Laterality Date  . BREAST SURGERY     augmentation  . BUNIONECTOMY Bilateral    screws  .  CESAREAN SECTION N/A 02/28/2015   Procedure: CESAREAN SECTION;  Surgeon: Linda Hedges, DO;  Location: [facility];  Service: Obstetrics;  Laterality: N/A;  . CESAREAN SECTION N/A 07/25/2017   Procedure: REPEAT CESAREAN SECTION;  Surgeon: Molli Posey, MD;  Location: [facility];  Service: Obstetrics;  Laterality: N/A;  Repeat edc 08/18/17 NKDA  . CHOLECYSTECTOMY    . DIAGNOSTIC LAPAROSCOPY    . I fuse     second back surgery states  this is the name of the procedure  . LUMBAR FUSION      Social History:  reports that she has been smoking cigarettes.  She has been smoking about 0.25 packs per day. She has never used smokeless tobacco. She reports that she has current or past drug history. She reports that she does not drink alcohol.  Family History:  Family History  Problem Relation Age of Onset  . Hypertension Father   . Arthritis Father      Prior to Admission medications   Medication Sig Start Date End Date Taking? Authorizing Provider  buprenorphine (SUBUTEX) 8 MG SUBL SL tablet Place 8 mg under the tongue 2 (two) times daily.  09/25/14  Yes [provider]  cephALEXin (KEFLEX) 500 MG capsule Take 1 capsule (500 mg total) by mouth 4 (four) times daily. 08/02/17  Yes Seabron Spates, CNM  famotidine (PEPCID) 20 MG tablet Take 1 tablet (20 mg total) by mouth 2 (two) times daily. Patient not taking: Reported on 08/08/2017 07/28/17   Marylynn Pearson, MD  ibuprofen (ADVIL,MOTRIN) 800 MG tablet Take 1 tablet (800 mg total) by mouth every 8 (eight) hours. Patient not taking: Reported on 08/08/2017 07/28/17   Marylynn Pearson, MD  lidocaine (LIDODERM) 5 % Place 1 patch onto the skin daily. Remove & Discard patch within 12 hours or as directed by MD Patient not taking: Reported on 08/08/2017 07/28/17   Marylynn Pearson, MD  oxyCODONE-acetaminophen (PERCOCET/ROXICET) 5-325 MG tablet Take 1 tablet by mouth every 4 (four) hours as needed (pain scale 4-7). Patient not taking: Reported on 08/08/2017 07/28/17   Marylynn Pearson, MD  promethazine (PHENERGAN) 25 MG tablet Take 1 tablet (25 mg total) by mouth every 6 (six) hours as needed for nausea or vomiting. Patient not taking: Reported on 08/08/2017 08/02/17   Seabron Spates, CNM    Physical Exam: Vitals:   08/09/17 0000 08/09/17 0143 08/09/17 0155 08/09/17 0200  BP: 98/80 108/63  116/66  Pulse: 91 93  89  Resp: 13 11  16   Temp:    98 F (36.7 C)  TempSrc:    Oral    SpO2: 97% 97%  95%  Weight:   60.4 kg (133 lb 2.5 oz)   Height:   5\' 4"  (1.626 m)    General: Not in acute distress HEENT:       Eyes: PERRL, EOMI, no scleral icterus.       ENT: No discharge from the ears and nose, no pharynx injection, no tonsillar enlargement.        Neck: No JVD, no bruit, no mass felt. Heme: No neck lymph node enlargement. Cardiac: S1/S2, RRR, No murmurs, No gallops or rubs. Respiratory: No rales, wheezing, rhonchi or rubs. GI: has tenderness in mid and lower abdomen, has distension, s/p of C-section, no signs of infection. No rebound pain, no organomegaly, BS present. GU: No hematuria Ext: No pitting leg edema bilaterally. 2+DP/PT pulse bilaterally. Musculoskeletal: No joint deformities, No joint redness or warmth, no limitation of ROM  in spin. Skin: No rashes.  Neuro: Alert, oriented X3, cranial nerves II-XII grossly intact, moves all extremities normally. Psych: Patient is not psychotic, no suicidal or hemocidal ideation.  Labs on Admission: I have personally reviewed following labs and imaging studies  CBC: Recent Labs  Lab 08/08/17 2044 08/09/17 0240  WBC 16.3* 13.7*  NEUTROABS 12.2*  --   HGB 13.2 11.6*  HCT 38.7 34.7*  MCV 88.2 89.4  PLT 352 062   Basic Metabolic Panel: Recent Labs  Lab 08/08/17 2044 08/09/17 0240  NA 133* 132*  K 2.2* 2.7*  CL 78* 81*  CO2 31 37*  GLUCOSE 122* 107*  BUN 22* 16  CREATININE 0.71 0.71  CALCIUM 8.9 8.3*  MG  --  1.9   GFR: Estimated Creatinine Clearance: 79.1 mL/min (by C-G formula based on SCr of 0.71 mg/dL). Liver Function Tests: Recent Labs  Lab 08/08/17 2044 08/09/17 0240  AST 16 18  ALT 22 18  ALKPHOS 104 92  BILITOT 0.4 0.6  PROT 7.4 6.3*  ALBUMIN 3.6 3.0*   Recent Labs  Lab 08/08/17 2044  LIPASE 36   No results for input(s): AMMONIA in the last 168 hours. Coagulation Profile: Recent Labs  Lab 08/09/17 0240  INR 0.94   Cardiac Enzymes: No results for input(s): CKTOTAL, CKMB,  CKMBINDEX, TROPONINI in the last 168 hours. BNP (last 3 results) No results for input(s): PROBNP in the last 8760 hours. HbA1C: No results for input(s): HGBA1C in the last 72 hours. CBG: No results for input(s): GLUCAP in the last 168 hours. Lipid Profile: No results for input(s): CHOL, HDL, LDLCALC, TRIG, CHOLHDL, LDLDIRECT in the last 72 hours. Thyroid Function Tests: No results for input(s): TSH, T4TOTAL, FREET4, T3FREE, THYROIDAB in the last 72 hours. Anemia Panel: No results for input(s): VITAMINB12, FOLATE, FERRITIN, TIBC, IRON, RETICCTPCT in the last 72 hours. Urine analysis:    Component Value Date/Time   COLORURINE YELLOW 08/08/2017 2316   APPEARANCEUR CLEAR 08/08/2017 2316   LABSPEC >1.046 (H) 08/08/2017 2316   PHURINE 5.0 08/08/2017 2316   GLUCOSEU NEGATIVE 08/08/2017 2316   HGBUR LARGE (A) 08/08/2017 2316   BILIRUBINUR NEGATIVE 08/08/2017 2316   KETONESUR NEGATIVE 08/08/2017 2316   PROTEINUR NEGATIVE 08/08/2017 2316   UROBILINOGEN 0.2 09/27/2014 1057   NITRITE NEGATIVE 08/08/2017 2316   LEUKOCYTESUR NEGATIVE 08/08/2017 2316   Sepsis Labs: @LABRCNTIP (procalcitonin:4,lacticidven:4) ) Recent Results (from the past 240 hour(s))  Culture, Urine     Status: Abnormal   Collection Time: 08/01/17  8:37 PM  Result Value Ref Range Status   Specimen Description   Final    URINE, CLEAN CATCH Performed at [facility], [address] [place], [place] [address]    Special Requests   Final    NONE Performed at [facility], [address], [place], [place] [address]    Culture (A)  Final    <10,000 COLONIES/mL INSIGNIFICANT GROWTH Performed at [facility], [place] [address] [place], [place] [address]    Report Status 08/03/2017 FINAL  Final     Radiological Exams on Admission: Dg Abdomen 1 View  Result Date: 08/09/2017 CLINICAL DATA:  NG tube placement EXAM: ABDOMEN - 1 VIEW COMPARISON:  08/07/2017 FINDINGS: Enteric tube tip in the left upper quadrant  consistent with location in the upper stomach. Persistent gas distention of left upper quadrant small bowel consistent with small bowel obstruction. Residual contrast material in the renal collecting systems and in the bladder. Postoperative changes in the lower lumbar  spine and right SI joint. Surgical clips in the right upper quadrant. IMPRESSION: Enteric tube tip is in the left upper quadrant consistent with location in the upper stomach. Gaseous distention of left upper quadrant small bowel consistent with small bowel obstruction. Electronically Signed   By: Lucienne Capers M.D.   On: 08/09/2017 01:36   Ct Abdomen Pelvis W Contrast  Result Date: 08/08/2017 CLINICAL DATA:  42 year old female with acute abdominal pain and vomiting. C-section on 07/25/2017. EXAM: CT ABDOMEN AND PELVIS WITH CONTRAST TECHNIQUE: Multidetector CT imaging of the abdomen and pelvis was performed using the standard protocol following bolus administration of intravenous contrast. CONTRAST:  142mL ISOVUE-300 IOPAMIDOL (ISOVUE-300) INJECTION 61% COMPARISON:  08/07/2017 radiographs.  05/20/2007 CT FINDINGS: Lower chest: No acute abnormality. Hepatobiliary: The liver is unremarkable. Patient is status post cholecystectomy. CBD dilatation to the ampulla is noted and measures 12 mm. Pancreas: Unremarkable Spleen: Unremarkable Adrenals/Urinary Tract: The kidneys, adrenal glands and bladder are unremarkable. Stomach/Bowel: Dilated proximal and mid small bowel loops with collapsed distal small bowel loops are compatible with a high-grade small bowel obstruction. The transition point is likely located within the anterior pelvis which there is a suggestion of anterior abdominal/pelvic wall eventration or hernia. Fluid within the eventration/hernia sac noted. There is no evidence of pneumoperitoneum.  No abscess is identified. Vascular/Lymphatic: Aortic atherosclerosis. No enlarged abdominal or pelvic lymph nodes. Reproductive: Enlarged uterus  with C-section changes noted. Other: No ascites noted. Musculoskeletal: No acute or suspicious bony abnormalities noted. LOWER lumbar and RIGHT SI joint effusion hardware identified. IMPRESSION: 1. High-grade small bowel obstruction with transition point likely within an anterior abdominal/pelvic wall eventration or hernia sac. No evidence of pneumoperitoneum. 2. Enlarged postpartum uterus with C-section changes. 3. CBD dilatation which could be related to small bowel obstruction. Electronically Signed   By: Margarette Canada M.D.   On: 08/08/2017 23:26   Dg Abd 2 Views  Result Date: 08/07/2017 CLINICAL DATA:  Abdominal bloating. Vomiting. Caesarean section on 07/25/2017. EXAM: ABDOMEN - 2 VIEW COMPARISON:  CT abdomen and pelvis 05/20/2007 FINDINGS: There is no evidence of intraperitoneal free air. There are multiple loops of dilated small bowel in the central and left abdomen measuring up to approximately 6 cm in diameter with some air-fluid levels. Stool and a small amount of gas are present in the colon. Sequelae of prior lumbar and right sacroiliac fusion are noted. Surgical clips are present in the right upper abdomen. The visualized lung bases are clear. IMPRESSION: Moderate small bowel dilatation concerning for obstruction. These results will be called to the ordering clinician or representative by the Radiology Department at the imaging location. Electronically Signed   By: Logan Bores M.D.   On: 08/07/2017 18:12                                             EKG: Independently reviewed.  Sinus rhythm, QTC 430, T wave inversion in inferior leads and V4-V6  Assessment/Plan Principal Problem:   SBO (small bowel obstruction) (HCC) Active Problems:   S/P cesarean section   Tobacco abuse   GERD (gastroesophageal reflux disease)   Substance abuse (HCC)   Hypokalemia   Leukocytosis   SBO (small bowel obstruction) ([place]): CT scan showed high-grade SBO with transition point likely within an anterior  abdominal/pelvic wall eventration or hernia sac.  General surgeon, Dr. Excell Seltzer was consulted-->will need to correct  electrolyte disturbance before patient can have surgery.  Patient has leukocytosis, no fever, hemodynamically stable.  -will admit to tele bed as inpt -NG tube -hold all oral meds -PRN Zofran for nausea, morphine for pain - IV fluid: 2 L Ringer's solution, followed by 125 cc/h - INR/PTT/type & screen  S/P cesarean section:  -pt states that she is doing breast-feeding  Tobacco abuse: -nicotine patch  GERD (gastroesophageal reflux disease) -iv  peptide 20 mg twice daily  Hypokalemia: K=2.2 on admission. - Repleted with 10 x 8 mEq of KCl by IV and 40 mEq orally - Check Mg level - Give 1 g of magnesium sulfate;  Leukocytosis: WBC 16.3.  No fever.  Clinically does not seem to have sepsis.  Urinalysis negative. -Blood culture, urine culture  Substance abuse: Patient is on buprenorphine SLl at home.  I discussed with pharmacist. It may be better to continue buprenorphine when patient is being treated with morphine for pain -will swit buprenorphine to Suboxone SL   DVT ppx:SCC Code Status: Full code Family Communication: None at bed side.  Disposition Plan:  Anticipate discharge back to previous home environment Consults called: Education officer, environmental, Dr. Excell Seltzer Admission status:   Inpatient/tele      Date of Service 08/09/2017    Ivor Costa [facility] Pager [phone]  If 7PM-7AM, please contact night-coverage [email or web address] Password [email or web address] 08/09/2017, 4:26 AM

## 2017-08-08 NOTE — ED Notes (Signed)
Bed: WA02 Expected date:  Expected time:  Means of arrival:  Comments: Transfer [facility], possible ileus, 2 weeks post c-section

## 2017-08-08 NOTE — MAU Note (Signed)
Pt arrived to MAU to be evaluated by provider.  Xray yesterday suspect for bowl obstruction.  Pt advised to return to [facility] hospital for evaluation that evening.  Pt did not return until today in the afternoon.  Dr Corinna Capra contacted, MD does not desire pt to be admitted to [facility] because of high census and pt accuity.  Pt advised that she will be evaluated in [facility]U and [facility] if [facility].  Pt becomes irritated and does not desire to "leave her car".  Pt is concerned that her stay will be prolonged with a transfer.  Pt educated that [facility] will be able to provide the best quality care if transfer is needed and that transfer would not delay care.  Pt is emotional and overwhelmed but agrees to be admitted to [facility] for assessment.

## 2017-08-08 NOTE — ED Notes (Signed)
Date and time results received: 08/08/17 2140 (use smartphrase ".now" to insert current time)  Test: K+ Critical Value: 2.2  Name of Provider Notified: Tyrone Nine  Orders Received? Or Actions Taken?: none

## 2017-08-09 ENCOUNTER — Encounter (HOSPITAL_COMMUNITY): Payer: Self-pay | Admitting: Internal Medicine

## 2017-08-09 ENCOUNTER — Inpatient Hospital Stay (HOSPITAL_COMMUNITY): Payer: 59

## 2017-08-09 ENCOUNTER — Encounter (HOSPITAL_COMMUNITY)
Admission: AD | Disposition: A | Discharge status: Home / Self Care | Payer: Self-pay | Source: Ambulatory Visit | Attending: Internal Medicine

## 2017-08-09 ENCOUNTER — Inpatient Hospital Stay (HOSPITAL_COMMUNITY): Payer: 59 | Admitting: Anesthesiology

## 2017-08-09 DIAGNOSIS — D72829 Elevated white blood cell count, unspecified: Secondary | ICD-10-CM | POA: Diagnosis present

## 2017-08-09 DIAGNOSIS — K430 Incisional hernia with obstruction, without gangrene: Secondary | ICD-10-CM

## 2017-08-09 DIAGNOSIS — Z720 Tobacco use: Secondary | ICD-10-CM | POA: Diagnosis present

## 2017-08-09 DIAGNOSIS — K219 Gastro-esophageal reflux disease without esophagitis: Secondary | ICD-10-CM | POA: Diagnosis present

## 2017-08-09 DIAGNOSIS — F1910 Other psychoactive substance abuse, uncomplicated: Secondary | ICD-10-CM | POA: Diagnosis present

## 2017-08-09 DIAGNOSIS — K565 Intestinal adhesions [bands], unspecified as to partial versus complete obstruction: Secondary | ICD-10-CM | POA: Diagnosis not present

## 2017-08-09 DIAGNOSIS — E876 Hypokalemia: Secondary | ICD-10-CM

## 2017-08-09 DIAGNOSIS — T8130XA Disruption of wound, unspecified, initial encounter: Secondary | ICD-10-CM

## 2017-08-09 DIAGNOSIS — K56609 Unspecified intestinal obstruction, unspecified as to partial versus complete obstruction: Secondary | ICD-10-CM | POA: Diagnosis present

## 2017-08-09 HISTORY — PX: LAPAROTOMY: SHX154

## 2017-08-09 LAB — CBC
HEMATOCRIT: 34.7 % — AB (ref 36.0–46.0)
HEMOGLOBIN: 11.6 g/dL — AB (ref 12.0–15.0)
MCH: 29.9 pg (ref 26.0–34.0)
MCHC: 33.4 g/dL (ref 30.0–36.0)
MCV: 89.4 fL (ref 78.0–100.0)
Platelets: 308 10*3/uL (ref 150–400)
RBC: 3.88 MIL/uL (ref 3.87–5.11)
RDW: 12.6 % (ref 11.5–15.5)
WBC: 13.7 10*3/uL — AB (ref 4.0–10.5)

## 2017-08-09 LAB — COMPREHENSIVE METABOLIC PANEL
ALBUMIN: 3 g/dL — AB (ref 3.5–5.0)
ALT: 18 U/L (ref 0–44)
ANION GAP: 14 (ref 5–15)
AST: 18 U/L (ref 15–41)
Alkaline Phosphatase: 92 U/L (ref 38–126)
BUN: 16 mg/dL (ref 6–20)
CO2: 37 mmol/L — AB (ref 22–32)
Calcium: 8.3 mg/dL — ABNORMAL LOW (ref 8.9–10.3)
Chloride: 81 mmol/L — ABNORMAL LOW (ref 98–111)
Creatinine, Ser: 0.71 mg/dL (ref 0.44–1.00)
GFR calc Af Amer: >60 mL/min (ref >60)
GFR calc non Af Amer: >60 mL/min (ref >60)
GLUCOSE: 107 mg/dL — AB (ref 70–99)
POTASSIUM: 2.7 mmol/L — AB (ref 3.5–5.1)
SODIUM: 132 mmol/L — AB (ref 135–145)
Total Bilirubin: 0.6 mg/dL (ref 0.3–1.2)
Total Protein: 6.3 g/dL — ABNORMAL LOW (ref 6.5–8.1)

## 2017-08-09 LAB — MAGNESIUM
Magnesium: 1.9 mg/dL (ref 1.7–2.4)
Magnesium: 2.4 mg/dL (ref 1.7–2.4)

## 2017-08-09 LAB — URINALYSIS, ROUTINE W REFLEX MICROSCOPIC
BACTERIA UA: NONE SEEN
BILIRUBIN URINE: NEGATIVE
Glucose, UA: NEGATIVE mg/dL
KETONES UR: NEGATIVE mg/dL
LEUKOCYTES UA: NEGATIVE
NITRITE: NEGATIVE
Protein, ur: NEGATIVE mg/dL
RBC / HPF: >50 RBC/hpf — ABNORMAL HIGH (ref 0–5)
pH: 5 (ref 5.0–8.0)

## 2017-08-09 LAB — PROTIME-INR
INR: 0.94
PROTHROMBIN TIME: 12.5 s (ref 11.4–15.2)

## 2017-08-09 LAB — BASIC METABOLIC PANEL
Anion gap: 9 (ref 5–15)
BUN: 14 mg/dL (ref 6–20)
CO2: 37 mmol/L — ABNORMAL HIGH (ref 22–32)
Calcium: 8.1 mg/dL — ABNORMAL LOW (ref 8.9–10.3)
Chloride: 87 mmol/L — ABNORMAL LOW (ref 98–111)
Creatinine, Ser: 0.6 mg/dL (ref 0.44–1.00)
GFR calc Af Amer: >60 mL/min (ref >60)
GFR calc non Af Amer: >60 mL/min (ref >60)
Glucose, Bld: 94 mg/dL (ref 70–99)
Potassium: 3 mmol/L — ABNORMAL LOW (ref 3.5–5.1)
Sodium: 133 mmol/L — ABNORMAL LOW (ref 135–145)

## 2017-08-09 LAB — LACTIC ACID, PLASMA
Lactic Acid, Venous: 0.7 mmol/L (ref 0.5–1.9)
Lactic Acid, Venous: 0.6 mmol/L (ref 0.5–1.9)

## 2017-08-09 LAB — TYPE AND SCREEN
ABO/RH(D): B POS
ANTIBODY SCREEN: NEGATIVE

## 2017-08-09 LAB — PROCALCITONIN: Procalcitonin: <0.1 ng/mL

## 2017-08-09 LAB — MRSA PCR SCREENING: MRSA BY PCR: NEGATIVE

## 2017-08-09 LAB — APTT: aPTT: 31 seconds (ref 24–36)

## 2017-08-09 LAB — ABO/RH: ABO/RH(D): B POS

## 2017-08-09 SURGERY — LAPAROTOMY, EXPLORATORY
Anesthesia: General | Site: Abdomen

## 2017-08-09 MED ORDER — FENTANYL CITRATE (PF) 100 MCG/2ML IJ SOLN
INTRAMUSCULAR | Status: AC
  Filled 2017-08-09: qty 2

## 2017-08-09 MED ORDER — ONDANSETRON HCL 4 MG/2ML IJ SOLN: 4.0000 mg | Freq: Three times a day (TID) | INTRAMUSCULAR | Status: DC | PRN

## 2017-08-09 MED ORDER — CEFOTETAN DISODIUM-DEXTROSE 2-2.08 GM-%(50ML) IV SOLR
2.0000 g | INTRAVENOUS | Status: DC
  Filled 2017-08-09: qty 50

## 2017-08-09 MED ORDER — MORPHINE SULFATE (PF) 2 MG/ML IV SOLN
1.0000 mg | INTRAVENOUS | Status: DC | PRN
  Administered 2017-08-09: 2 mg via INTRAVENOUS
  Filled 2017-08-09: qty 1

## 2017-08-09 MED ORDER — SODIUM CHLORIDE 0.9 % IV SOLN
2.0000 g | INTRAVENOUS | Status: AC
  Administered 2017-08-09: 2 g via INTRAVENOUS
  Filled 2017-08-09: qty 2

## 2017-08-09 MED ORDER — BUPIVACAINE-EPINEPHRINE (PF) 0.25% -1:200000 IJ SOLN
INTRAMUSCULAR | Status: AC
  Filled 2017-08-09: qty 30

## 2017-08-09 MED ORDER — FAMOTIDINE IN NACL 20-0.9 MG/50ML-% IV SOLN
20.0000 mg | Freq: Two times a day (BID) | INTRAVENOUS | Status: DC
  Administered 2017-08-09 – 2017-08-14 (×11): 20 mg via INTRAVENOUS
  Filled 2017-08-09 (×11): qty 50

## 2017-08-09 MED ORDER — CHLORHEXIDINE GLUCONATE CLOTH 2 % EX PADS
6.0000 | MEDICATED_PAD | Freq: Once | CUTANEOUS | Status: AC
  Administered 2017-08-09: 6 via TOPICAL

## 2017-08-09 MED ORDER — MAGNESIUM SULFATE IN D5W 1-5 GM/100ML-% IV SOLN
1.0000 g | Freq: Once | INTRAVENOUS | Status: AC
  Administered 2017-08-09: 1 g via INTRAVENOUS
  Filled 2017-08-09: qty 100

## 2017-08-09 MED ORDER — ACETAMINOPHEN 10 MG/ML IV SOLN
INTRAVENOUS | Status: AC
  Filled 2017-08-09: qty 100

## 2017-08-09 MED ORDER — GUAIFENESIN-DM 100-10 MG/5ML PO SYRP: 10.0000 mL | ORAL_SOLUTION | ORAL | Status: DC | PRN

## 2017-08-09 MED ORDER — STERILE WATER FOR IRRIGATION IR SOLN
Status: DC | PRN
  Administered 2017-08-09: 2000 mL

## 2017-08-09 MED ORDER — MIDAZOLAM HCL 2 MG/2ML IJ SOLN
INTRAMUSCULAR | Status: AC
  Filled 2017-08-09: qty 2

## 2017-08-09 MED ORDER — LIDOCAINE 2% (20 MG/ML) 5 ML SYRINGE
INTRAMUSCULAR | Status: DC | PRN
  Administered 2017-08-09: 100 mg via INTRAVENOUS

## 2017-08-09 MED ORDER — ACETAMINOPHEN 325 MG PO TABS: 650.0000 mg | ORAL_TABLET | Freq: Four times a day (QID) | ORAL | Status: DC | PRN

## 2017-08-09 MED ORDER — PROPOFOL 10 MG/ML IV BOLUS
INTRAVENOUS | Status: DC | PRN
  Administered 2017-08-09: 170 mg via INTRAVENOUS

## 2017-08-09 MED ORDER — HYDROCORTISONE 1 % EX CREA: 1.0000 "application " | TOPICAL_CREAM | Freq: Three times a day (TID) | CUTANEOUS | Status: DC | PRN

## 2017-08-09 MED ORDER — LIDOCAINE 2% (20 MG/ML) 5 ML SYRINGE
INTRAMUSCULAR | Status: AC
  Filled 2017-08-09: qty 5

## 2017-08-09 MED ORDER — BUPIVACAINE-EPINEPHRINE (PF) 0.25% -1:200000 IJ SOLN
INTRAMUSCULAR | Status: DC | PRN
  Administered 2017-08-09: 60 mL

## 2017-08-09 MED ORDER — ROCURONIUM BROMIDE 100 MG/10ML IV SOLN
INTRAVENOUS | Status: DC | PRN
  Administered 2017-08-09 (×2): 10 mg via INTRAVENOUS
  Administered 2017-08-09: 20 mg via INTRAVENOUS

## 2017-08-09 MED ORDER — ACETAMINOPHEN 650 MG RE SUPP: 650.0000 mg | Freq: Four times a day (QID) | RECTAL | Status: DC | PRN

## 2017-08-09 MED ORDER — PROCHLORPERAZINE EDISYLATE 10 MG/2ML IJ SOLN: 5.0000 mg | INTRAMUSCULAR | Status: DC | PRN

## 2017-08-09 MED ORDER — 0.9 % SODIUM CHLORIDE (POUR BTL) OPTIME
TOPICAL | Status: DC | PRN
  Administered 2017-08-09: 1000 mL

## 2017-08-09 MED ORDER — SCOPOLAMINE 1 MG/3DAYS TD PT72
1.0000 | MEDICATED_PATCH | TRANSDERMAL | Status: DC
  Administered 2017-08-09: 1 via TRANSDERMAL
  Filled 2017-08-09 (×2): qty 1

## 2017-08-09 MED ORDER — KETAMINE HCL 10 MG/ML IJ SOLN
INTRAMUSCULAR | Status: AC
  Filled 2017-08-09: qty 1

## 2017-08-09 MED ORDER — SUCCINYLCHOLINE CHLORIDE 200 MG/10ML IV SOSY
PREFILLED_SYRINGE | INTRAVENOUS | Status: AC
  Filled 2017-08-09: qty 10

## 2017-08-09 MED ORDER — LACTATED RINGERS IV BOLUS
1000.0000 mL | Freq: Once | INTRAVENOUS | Status: AC
  Administered 2017-08-09: 1000 mL via INTRAVENOUS

## 2017-08-09 MED ORDER — METHOCARBAMOL 1000 MG/10ML IJ SOLN
1000.0000 mg | Freq: Four times a day (QID) | INTRAVENOUS | Status: DC | PRN
  Filled 2017-08-09: qty 10

## 2017-08-09 MED ORDER — ACETAMINOPHEN 10 MG/ML IV SOLN
1000.0000 mg | Freq: Once | INTRAVENOUS | Status: AC
  Filled 2017-08-09: qty 100

## 2017-08-09 MED ORDER — PHENOL 1.4 % MT LIQD: 1.0000 | OROMUCOSAL | Status: DC | PRN

## 2017-08-09 MED ORDER — SODIUM CHLORIDE 0.9 % IV SOLN
INTRAVENOUS | Status: DC
  Administered 2017-08-09 (×2): via INTRAVENOUS

## 2017-08-09 MED ORDER — LACTATED RINGERS IR SOLN
Status: DC | PRN
  Administered 2017-08-09: 1000 mL

## 2017-08-09 MED ORDER — POTASSIUM CHLORIDE 10 MEQ/100ML IV SOLN
10.0000 meq | INTRAVENOUS | Status: AC
  Administered 2017-08-09 (×2): 10 meq via INTRAVENOUS
  Filled 2017-08-09: qty 100

## 2017-08-09 MED ORDER — BUPIVACAINE LIPOSOME 1.3 % IJ SUSP
INTRAMUSCULAR | Status: DC | PRN
  Administered 2017-08-09: 20 mL

## 2017-08-09 MED ORDER — ALUM & MAG HYDROXIDE-SIMETH 200-200-20 MG/5ML PO SUSP
30.0000 mL | Freq: Four times a day (QID) | ORAL | Status: DC | PRN
  Filled 2017-08-09: qty 30

## 2017-08-09 MED ORDER — SODIUM CHLORIDE 0.9 % IV SOLN
INTRAVENOUS | Status: AC
  Filled 2017-08-09: qty 2

## 2017-08-09 MED ORDER — HYDROMORPHONE HCL 1 MG/ML IJ SOLN
INTRAMUSCULAR | Status: AC
  Filled 2017-08-09: qty 1

## 2017-08-09 MED ORDER — KETAMINE HCL 10 MG/ML IJ SOLN
INTRAMUSCULAR | Status: DC | PRN
  Administered 2017-08-09 (×3): 20 mg via INTRAVENOUS

## 2017-08-09 MED ORDER — MAGIC MOUTHWASH
15.0000 mL | Freq: Four times a day (QID) | ORAL | Status: DC | PRN
  Filled 2017-08-09: qty 15

## 2017-08-09 MED ORDER — ESMOLOL HCL 100 MG/10ML IV SOLN
INTRAVENOUS | Status: AC
  Filled 2017-08-09: qty 10

## 2017-08-09 MED ORDER — POTASSIUM CHLORIDE 20 MEQ/15ML (10%) PO SOLN: 40.0000 meq | Freq: Once | ORAL | Status: DC

## 2017-08-09 MED ORDER — DEXAMETHASONE SODIUM PHOSPHATE 10 MG/ML IJ SOLN
INTRAMUSCULAR | Status: DC | PRN
  Administered 2017-08-09: 10 mg via INTRAVENOUS

## 2017-08-09 MED ORDER — BUPIVACAINE LIPOSOME 1.3 % IJ SUSP
20.0000 mL | INTRAMUSCULAR | Status: AC
  Filled 2017-08-09: qty 20

## 2017-08-09 MED ORDER — HYDROMORPHONE HCL 1 MG/ML IJ SOLN
0.5000 mg | INTRAMUSCULAR | Status: DC | PRN
  Administered 2017-08-09 – 2017-08-10 (×7): 2 mg via INTRAVENOUS
  Filled 2017-08-09 (×7): qty 2

## 2017-08-09 MED ORDER — SODIUM CHLORIDE 0.9 % IV SOLN
INTRAVENOUS | Status: DC | PRN
  Administered 2017-08-09: 1000 mL via INTRAPERITONEAL

## 2017-08-09 MED ORDER — SUGAMMADEX SODIUM 200 MG/2ML IV SOLN
INTRAVENOUS | Status: AC
  Filled 2017-08-09: qty 2

## 2017-08-09 MED ORDER — KCL IN DEXTROSE-NACL 40-5-0.45 MEQ/L-%-% IV SOLN
INTRAVENOUS | Status: DC
  Administered 2017-08-09 – 2017-08-13 (×9): via INTRAVENOUS
  Filled 2017-08-09 (×10): qty 1000

## 2017-08-09 MED ORDER — HYDROCORTISONE 2.5 % RE CREA: 1.0000 "application " | TOPICAL_CREAM | Freq: Four times a day (QID) | RECTAL | Status: DC | PRN

## 2017-08-09 MED ORDER — POTASSIUM CHLORIDE 10 MEQ/100ML IV SOLN
10.0000 meq | INTRAVENOUS | Status: AC
  Administered 2017-08-09 (×3): 10 meq via INTRAVENOUS
  Filled 2017-08-09 (×3): qty 100

## 2017-08-09 MED ORDER — MENTHOL 3 MG MT LOZG
1.0000 | LOZENGE | OROMUCOSAL | Status: DC | PRN
  Administered 2017-08-11: 3 mg via ORAL
  Filled 2017-08-09: qty 9

## 2017-08-09 MED ORDER — PROMETHAZINE HCL 25 MG/ML IJ SOLN: 6.2500 mg | INTRAMUSCULAR | Status: DC | PRN

## 2017-08-09 MED ORDER — BUPRENORPHINE HCL-NALOXONE HCL 8-2 MG SL SUBL
1.0000 | SUBLINGUAL_TABLET | Freq: Every day | SUBLINGUAL | Status: DC
  Administered 2017-08-10 – 2017-08-14 (×5): 1 via SUBLINGUAL
  Filled 2017-08-09 (×5): qty 1

## 2017-08-09 MED ORDER — LIP MEDEX EX OINT
1.0000 "application " | TOPICAL_OINTMENT | Freq: Two times a day (BID) | CUTANEOUS | Status: DC
  Administered 2017-08-09 – 2017-08-14 (×9): 1 via TOPICAL
  Filled 2017-08-09 (×2): qty 7

## 2017-08-09 MED ORDER — SODIUM CHLORIDE 0.9 % IV SOLN
INTRAVENOUS | Status: AC
  Filled 2017-08-09: qty 6

## 2017-08-09 MED ORDER — ACETAMINOPHEN 10 MG/ML IV SOLN
1000.0000 mg | Freq: Once | INTRAVENOUS | Status: DC | PRN
  Administered 2017-08-09: 1000 mg via INTRAVENOUS

## 2017-08-09 MED ORDER — HYDROMORPHONE HCL 1 MG/ML IJ SOLN
0.2500 mg | INTRAMUSCULAR | Status: DC | PRN
  Administered 2017-08-09 (×2): 0.5 mg via INTRAVENOUS

## 2017-08-09 MED ORDER — LACTATED RINGERS IV SOLN
INTRAVENOUS | Status: DC | PRN
  Administered 2017-08-09 (×2): via INTRAVENOUS

## 2017-08-09 MED ORDER — NICOTINE 21 MG/24HR TD PT24
21.0000 mg | MEDICATED_PATCH | Freq: Every day | TRANSDERMAL | Status: DC
  Administered 2017-08-10 – 2017-08-14 (×5): 21 mg via TRANSDERMAL
  Filled 2017-08-09 (×5): qty 1

## 2017-08-09 MED ORDER — ONDANSETRON HCL 4 MG/2ML IJ SOLN
INTRAMUSCULAR | Status: DC | PRN
  Administered 2017-08-09: 4 mg via INTRAVENOUS

## 2017-08-09 MED ORDER — PROPOFOL 10 MG/ML IV BOLUS
INTRAVENOUS | Status: AC
  Filled 2017-08-09: qty 20

## 2017-08-09 MED ORDER — LACTATED RINGERS IV BOLUS: 1000.0000 mL | Freq: Three times a day (TID) | INTRAVENOUS | Status: DC | PRN

## 2017-08-09 MED ORDER — SODIUM CHLORIDE 0.9 % IJ SOLN
INTRAMUSCULAR | Status: AC
  Filled 2017-08-09: qty 50

## 2017-08-09 MED ORDER — HYDROCODONE-ACETAMINOPHEN 7.5-325 MG PO TABS: 1.0000 | ORAL_TABLET | Freq: Once | ORAL | Status: DC | PRN

## 2017-08-09 MED ORDER — MEPERIDINE HCL 50 MG/ML IJ SOLN: 6.2500 mg | INTRAMUSCULAR | Status: DC | PRN

## 2017-08-09 MED ORDER — METOCLOPRAMIDE HCL 5 MG/ML IJ SOLN: 10.0000 mg | Freq: Four times a day (QID) | INTRAMUSCULAR | Status: DC | PRN

## 2017-08-09 MED ORDER — SCOPOLAMINE 1 MG/3DAYS TD PT72
MEDICATED_PATCH | TRANSDERMAL | Status: AC
  Filled 2017-08-09: qty 1

## 2017-08-09 MED ORDER — SUCCINYLCHOLINE CHLORIDE 200 MG/10ML IV SOSY
PREFILLED_SYRINGE | INTRAVENOUS | Status: DC | PRN
  Administered 2017-08-09: 60 mg via INTRAVENOUS

## 2017-08-09 MED ORDER — FENTANYL CITRATE (PF) 100 MCG/2ML IJ SOLN
INTRAMUSCULAR | Status: DC | PRN
  Administered 2017-08-09 (×2): 50 ug via INTRAVENOUS
  Administered 2017-08-09: 100 ug via INTRAVENOUS

## 2017-08-09 MED ORDER — SUGAMMADEX SODIUM 200 MG/2ML IV SOLN
INTRAVENOUS | Status: DC | PRN
  Administered 2017-08-09: 150 mg via INTRAVENOUS

## 2017-08-09 MED ORDER — BUPIVACAINE-EPINEPHRINE (PF) 0.5% -1:200000 IJ SOLN
INTRAMUSCULAR | Status: AC
  Filled 2017-08-09: qty 30

## 2017-08-09 MED ORDER — DEXAMETHASONE SODIUM PHOSPHATE 10 MG/ML IJ SOLN
INTRAMUSCULAR | Status: AC
  Filled 2017-08-09: qty 1

## 2017-08-09 MED ORDER — SODIUM CHLORIDE 0.9 % IV SOLN
2.0000 g | Freq: Two times a day (BID) | INTRAVENOUS | Status: AC
  Administered 2017-08-09: 2 g via INTRAVENOUS
  Filled 2017-08-09: qty 2

## 2017-08-09 MED ORDER — ONDANSETRON HCL 4 MG/2ML IJ SOLN
INTRAMUSCULAR | Status: AC
  Filled 2017-08-09: qty 2

## 2017-08-09 MED ORDER — ROCURONIUM BROMIDE 100 MG/10ML IV SOLN
INTRAVENOUS | Status: AC
  Filled 2017-08-09: qty 1

## 2017-08-09 MED ORDER — MORPHINE SULFATE (PF) 2 MG/ML IV SOLN
2.0000 mg | INTRAVENOUS | Status: DC | PRN
  Administered 2017-08-09: 2 mg via INTRAVENOUS
  Filled 2017-08-09: qty 1

## 2017-08-09 SURGICAL SUPPLY — 74 items
BAG DECANTER FOR FLEXI CONT (MISCELLANEOUS) ×3 IMPLANT
BLADE EXTENDED COATED 6.5IN (ELECTRODE) ×3 IMPLANT
BLADE HEX COATED 2.75 (ELECTRODE) IMPLANT
CABLE HIGH FREQUENCY MONO STRZ (ELECTRODE) ×3 IMPLANT
CATH KIT ON-Q SILVERSOAK 7.5IN (CATHETERS) IMPLANT
CHLORAPREP W/TINT 26ML (MISCELLANEOUS) ×3 IMPLANT
COUNTER NEEDLE 20 DBL MAG RED (NEEDLE) IMPLANT
COVER MAYO STAND STRL (DRAPES) ×3 IMPLANT
COVER SURGICAL LIGHT HANDLE (MISCELLANEOUS) ×3 IMPLANT
DECANTER SPIKE VIAL GLASS SM (MISCELLANEOUS) ×6 IMPLANT
DRAIN CHANNEL 19F RND (DRAIN) IMPLANT
DRAPE LAPAROSCOPIC ABDOMINAL (DRAPES) ×3 IMPLANT
DRAPE SHEET LG 3/4 BI-LAMINATE (DRAPES) ×6 IMPLANT
DRAPE UTILITY XL STRL (DRAPES) ×6 IMPLANT
DRAPE WARM FLUID 44X44 (DRAPE) ×3 IMPLANT
DRSG OPSITE POSTOP 4X10 (GAUZE/BANDAGES/DRESSINGS) ×3 IMPLANT
DRSG OPSITE POSTOP 4X6 (GAUZE/BANDAGES/DRESSINGS) IMPLANT
DRSG OPSITE POSTOP 4X8 (GAUZE/BANDAGES/DRESSINGS) IMPLANT
DRSG TEGADERM 2-3/8X2-3/4 SM (GAUZE/BANDAGES/DRESSINGS) ×9 IMPLANT
DRSG TEGADERM 4X4.75 (GAUZE/BANDAGES/DRESSINGS) ×3 IMPLANT
ELECT PENCIL ROCKER SW 15FT (MISCELLANEOUS) ×3 IMPLANT
ELECT REM PT RETURN 15FT ADLT (MISCELLANEOUS) ×3 IMPLANT
EVACUATOR SILICONE 100CC (DRAIN) ×3 IMPLANT
GAUZE SPONGE 2X2 8PLY STRL LF (GAUZE/BANDAGES/DRESSINGS) ×1 IMPLANT
GAUZE SPONGE 4X4 12PLY STRL (GAUZE/BANDAGES/DRESSINGS) IMPLANT
GLOVE BIOGEL PI IND STRL 7.0 (GLOVE) ×3 IMPLANT
GLOVE BIOGEL PI IND STRL 7.5 (GLOVE) ×2 IMPLANT
GLOVE BIOGEL PI INDICATOR 7.0 (GLOVE) ×6
GLOVE BIOGEL PI INDICATOR 7.5 (GLOVE) ×4
GLOVE ECLIPSE 8.0 STRL XLNG CF (GLOVE) ×6 IMPLANT
GLOVE INDICATOR 8.0 STRL GRN (GLOVE) ×6 IMPLANT
GOWN SPEC L4 XLG W/TWL (GOWN DISPOSABLE) ×3 IMPLANT
GOWN STRL REUS W/ TWL LRG LVL4 (GOWN DISPOSABLE) ×1 IMPLANT
GOWN STRL REUS W/TWL LRG LVL4 (GOWN DISPOSABLE) ×2
GOWN STRL REUS W/TWL XL LVL3 (GOWN DISPOSABLE) ×6 IMPLANT
HANDLE SUCTION POOLE (INSTRUMENTS) ×1 IMPLANT
KIT BASIN OR (CUSTOM PROCEDURE TRAY) ×3 IMPLANT
LEGGING LITHOTOMY PAIR STRL (DRAPES) IMPLANT
PACK GENERAL/GYN (CUSTOM PROCEDURE TRAY) ×3 IMPLANT
PAD POSITIONING PINK XL (MISCELLANEOUS) ×3 IMPLANT
SCISSORS LAP 5X35 DISP (ENDOMECHANICALS) ×3 IMPLANT
SET IRRIG TUBING LAPAROSCOPIC (IRRIGATION / IRRIGATOR) ×3 IMPLANT
SLEEVE XCEL OPT CAN 5 100 (ENDOMECHANICALS) ×9 IMPLANT
SPONGE GAUZE 2X2 STER 10/PKG (GAUZE/BANDAGES/DRESSINGS) ×2
SPONGE LAP 18X18 RF (DISPOSABLE) ×3 IMPLANT
SPONGE LAP 18X18 X RAY DECT (DISPOSABLE) ×3 IMPLANT
STAPLER 90 3.5 STAND SLIM (STAPLE) ×3
STAPLER 90 3.5 STD SLIM (STAPLE) ×1 IMPLANT
STAPLER PROXIMATE 75MM BLUE (STAPLE) ×3 IMPLANT
STAPLER VISISTAT 35W (STAPLE) IMPLANT
SUCTION POOLE HANDLE (INSTRUMENTS) ×3
SUT MNCRL AB 4-0 PS2 18 (SUTURE) IMPLANT
SUT PDS AB 0 CT 36 (SUTURE) ×6 IMPLANT
SUT PDS AB 1 CTX 36 (SUTURE) IMPLANT
SUT PDS AB 1 TP1 96 (SUTURE) ×6 IMPLANT
SUT PROLENE 2 0 SH DA (SUTURE) ×3 IMPLANT
SUT SILK 2 0 (SUTURE)
SUT SILK 2 0 SH CR/8 (SUTURE) IMPLANT
SUT SILK 2-0 18XBRD TIE 12 (SUTURE) IMPLANT
SUT SILK 3 0 (SUTURE)
SUT SILK 3 0 SH CR/8 (SUTURE) ×3 IMPLANT
SUT SILK 3-0 18XBRD TIE 12 (SUTURE) IMPLANT
SUT VICRYL 0 UR6 27IN ABS (SUTURE) IMPLANT
SYR 30ML LL (SYRINGE) ×3 IMPLANT
SYR BULB IRRIGATION 50ML (SYRINGE) ×3 IMPLANT
TAPE UMBILICAL COTTON 1/8X30 (MISCELLANEOUS) ×3 IMPLANT
TOWEL OR 17X26 10 PK STRL BLUE (TOWEL DISPOSABLE) ×3 IMPLANT
TOWEL OR NON WOVEN STRL DISP B (DISPOSABLE) ×6 IMPLANT
TRAY FOLEY CATH 14FRSI W/METER (CATHETERS) ×3 IMPLANT
TRAY FOLEY MTR SLVR 16FR STAT (SET/KITS/TRAYS/PACK) IMPLANT
TROCAR ADV FIXATION 12X100MM (TROCAR) ×3 IMPLANT
TROCAR XCEL NON-BLD 11X100MML (ENDOMECHANICALS) ×3 IMPLANT
TUBING INSUF HEATED (TUBING) ×3 IMPLANT
YANKAUER SUCT BULB TIP 10FT TU (MISCELLANEOUS) ×3 IMPLANT

## 2017-08-09 NOTE — Anesthesia Preprocedure Evaluation (Addendum)
Anesthesia Evaluation  Patient identified by MRN, date of birth, ID band Patient awake  General Assessment Comment:Previous C/section she states she could not move her legs or walk for 24 hours  Reviewed: Allergy & Precautions, NPO status , Patient's Chart, lab work & pertinent test results  Airway Mallampati: II  TM Distance: >3 FB Neck ROM: Full    Dental  (+) Teeth Intact, Caps   Pulmonary neg pulmonary ROS, Current Smoker,    Pulmonary exam normal breath sounds clear to auscultation       Cardiovascular negative cardio ROS Normal cardiovascular exam Rhythm:Regular Rate:Normal     Neuro/Psych PSYCHIATRIC DISORDERS Depression  Neuromuscular disease    GI/Hepatic negative GI ROS, Neg liver ROS, (+)     substance abuse  , Hx/o opioid dependence on subutex- last dose 4am   Endo/Other  negative endocrine ROS  Renal/GU negative Renal ROS  negative genitourinary   Musculoskeletal negative musculoskeletal ROS (+) Arthritis , Osteoarthritis,  Chronic back pain S/P lumbar fusion Right leg radiculopathy and numbness   Abdominal   Peds  Hematology  (+) anemia ,   Anesthesia Other Findings   Reproductive/Obstetrics negative OB ROS Hx/o previous C/Section Posterior placenta previa AMA                             Lab Results  Component Value Date   CREATININE 0.60 08/09/2017   BUN 14 08/09/2017   NA 133 (L) 08/09/2017   K 3.0 (L) 08/09/2017   CL 87 (L) 08/09/2017   CO2 37 (H) 08/09/2017    Lab Results  Component Value Date   WBC 13.7 (H) 08/09/2017   HGB 11.6 (L) 08/09/2017   HCT 34.7 (L) 08/09/2017   MCV 89.4 08/09/2017   PLT 308 08/09/2017    Anesthesia Physical  Anesthesia Plan  ASA: II and emergent  Anesthesia Plan: General   Post-op Pain Management:    Induction: Intravenous, Cricoid pressure planned and Rapid sequence  PONV Risk Score and Plan: Scopolamine patch -  Pre-op, Ondansetron, Dexamethasone and Treatment may vary due to age or medical condition  Airway Management Planned: Oral ETT  Additional Equipment:   Intra-op Plan:   Post-operative Plan: Extubation in OR  Informed Consent: I have reviewed the patients History and Physical, chart, labs and discussed the procedure including the risks, benefits and alternatives for the proposed anesthesia with the patient or authorized representative who has indicated his/her understanding and acceptance.     Plan Discussed with: CRNA and Surgeon  Anesthesia Plan Comments:        Anesthesia Quick Evaluation

## 2017-08-09 NOTE — Progress Notes (Signed)
Patient is resting and reports some incisional pain.  BP 124/80 (BP Location: Right Arm)   Pulse 79   Temp 98.6 F (37 C) (Oral)   Resp 20   Ht 5\' 4"  (1.626 m)   Wt 60.4 kg (133 lb 2.5 oz)   LMP  (LMP Unknown)   SpO2 100%   Breastfeeding? No   BMI 22.86 kg/m  Results for orders placed or performed during the hospital encounter of 08/08/17 (from the past 24 hour(s))  Urinalysis, Routine w reflex microscopic     Status: Abnormal   Collection Time: 08/08/17 11:16 PM  Result Value Ref Range   Color, Urine YELLOW YELLOW   APPearance CLEAR CLEAR   Specific Gravity, Urine >1.046 (H) 1.005 - 1.030   pH 5.0 5.0 - 8.0   Glucose, UA NEGATIVE NEGATIVE mg/dL   Hgb urine dipstick LARGE (A) NEGATIVE   Bilirubin Urine NEGATIVE NEGATIVE   Ketones, ur NEGATIVE NEGATIVE mg/dL   Protein, ur NEGATIVE NEGATIVE mg/dL   Nitrite NEGATIVE NEGATIVE   Leukocytes, UA NEGATIVE NEGATIVE   RBC / HPF >50 (H) 0 - 5 RBC/hpf   WBC, UA 0-5 0 - 5 WBC/hpf   Bacteria, UA NONE SEEN NONE SEEN   Squamous Epithelial / LPF 0-5 0 - 5  ABO/Rh     Status: None   Collection Time: 08/09/17  2:37 AM  Result Value Ref Range   ABO/RH(D)      B POS Performed at [facility], [place] [address], [place], [place] [address]   Comprehensive metabolic panel     Status: Abnormal   Collection Time: 08/09/17  2:40 AM  Result Value Ref Range   Sodium 132 (L) 135 - 145 mmol/L   Potassium 2.7 (LL) 3.5 - 5.1 mmol/L   Chloride 81 (L) 98 - 111 mmol/L   CO2 37 (H) 22 - 32 mmol/L   Glucose, Bld 107 (H) 70 - 99 mg/dL   BUN 16 6 - 20 mg/dL   Creatinine, Ser 0.71 0.44 - 1.00 mg/dL   Calcium 8.3 (L) 8.9 - 10.3 mg/dL   Total Protein 6.3 (L) 6.5 - 8.1 g/dL   Albumin 3.0 (L) 3.5 - 5.0 g/dL   AST 18 15 - 41 U/L   ALT 18 0 - 44 U/L   Alkaline Phosphatase 92 38 - 126 U/L   Total Bilirubin 0.6 0.3 - 1.2 mg/dL   GFR calc non Af Amer >60 >60 mL/min   GFR calc Af Amer >60 >60 mL/min   Anion gap 14 5 - 15  Magnesium      Status: None   Collection Time: 08/09/17  2:40 AM  Result Value Ref Range   Magnesium 1.9 1.7 - 2.4 mg/dL  CBC     Status: Abnormal   Collection Time: 08/09/17  2:40 AM  Result Value Ref Range   WBC 13.7 (H) 4.0 - 10.5 K/uL   RBC 3.88 3.87 - 5.11 MIL/uL   Hemoglobin 11.6 (L) 12.0 - 15.0 g/dL   HCT 34.7 (L) 36.0 - 46.0 %   MCV 89.4 78.0 - 100.0 fL   MCH 29.9 26.0 - 34.0 pg   MCHC 33.4 30.0 - 36.0 g/dL   RDW 12.6 11.5 - 15.5 %   Platelets 308 150 - 400 K/uL  Culture, blood (Routine X 2) w Reflex to ID Panel     Status: None (Preliminary result)   Collection Time: 08/09/17  2:40 AM  Result Value Ref Range  Specimen Description BLOOD RIGHT ARM    Special Requests      BOTTLES DRAWN AEROBIC AND ANAEROBIC Blood Culture adequate volume Performed at [facility] [address] [place], [place] [address]    Culture PENDING    Report Status PENDING   Type and screen [place]     Status: None   Collection Time: 08/09/17  2:40 AM  Result Value Ref Range   ABO/RH(D) B POS    Antibody Screen NEG    Sample Expiration      08/12/2017 Performed at [facility], [place] [address], [place], [place] [address]   Procalcitonin     Status: None   Collection Time: 08/09/17  2:40 AM  Result Value Ref Range   Procalcitonin <0.10 ng/mL  Protime-INR     Status: None   Collection Time: 08/09/17  2:40 AM  Result Value Ref Range   Prothrombin Time 12.5 11.4 - 15.2 seconds   INR 0.94   APTT     Status: None   Collection Time: 08/09/17  2:40 AM  Result Value Ref Range   aPTT 31 24 - 36 seconds  Lactic acid, plasma     Status: None   Collection Time: 08/09/17  2:40 AM  Result Value Ref Range   Lactic Acid, Venous 0.7 0.5 - 1.9 mmol/L  Basic metabolic panel     Status: Abnormal   Collection Time: 08/09/17  6:17 AM  Result Value Ref Range   Sodium 133 (L) 135 - 145 mmol/L   Potassium 3.0 (L) 3.5 - 5.1 mmol/L   Chloride 87 (L) 98 - 111  mmol/L   CO2 37 (H) 22 - 32 mmol/L   Glucose, Bld 94 70 - 99 mg/dL   BUN 14 6 - 20 mg/dL   Creatinine, Ser 0.60 0.44 - 1.00 mg/dL   Calcium 8.1 (L) 8.9 - 10.3 mg/dL   GFR calc non Af Amer >60 >60 mL/min   GFR calc Af Amer >60 >60 mL/min   Anion gap 9 5 - 15  Lactic acid, plasma     Status: None   Collection Time: 08/09/17  6:17 AM  Result Value Ref Range   Lactic Acid, Venous 0.6 0.5 - 1.9 mmol/L  Magnesium     Status: None   Collection Time: 08/09/17  6:17 AM  Result Value Ref Range   Magnesium 2.4 1.7 - 2.4 mg/dL  MRSA PCR Screening     Status: None   Collection Time: 08/09/17  9:50 AM  Result Value Ref Range   MRSA by PCR NEGATIVE NEGATIVE   Abdomen  Flat Bandage is clean and dry except for dry patch of blood on left side near JP drain site JP is draining small amount bloody fluid  POD # 0  Small bowel resection Appreciate General surgery care Will follow with them

## 2017-08-09 NOTE — Anesthesia Postprocedure Evaluation (Signed)
Anesthesia Post Note  Patient: [name]  Procedure(s) Performed: LAPAROSCOPY, LYSIS OF ADHESIONS, SMALL BOWEL RESECTION, FASCIAL CLOSURE, BILATERAL TAP BLOCK (N/A Abdomen)     Patient location during evaluation: PACU Anesthesia Type: General Level of consciousness: awake and alert Pain management: pain level controlled Vital Signs Assessment: post-procedure vital signs reviewed and stable Respiratory status: spontaneous breathing, nonlabored ventilation, respiratory function stable and patient connected to nasal cannula oxygen Cardiovascular status: blood pressure returned to baseline and stable Postop Assessment: no apparent nausea or vomiting Anesthetic complications: no    Last Vitals:  Vitals:   08/09/17 1330 08/09/17 1347  BP: 118/76 123/77  Pulse: 83 79  Resp: (!) 26 20  Temp: 36.4 C 37.1 C  SpO2: 94% 99%    Last Pain:  Vitals:   08/09/17 1347  TempSrc: Oral  PainSc:                  Barnet Glasgow

## 2017-08-09 NOTE — Progress Notes (Signed)
PROGRESS NOTE    [name]  GQQ:[id number] DOB: 16-Mar-1975 DOA: 08/08/2017 PCP: Kathyrn Lass, MD    Brief Narrative: [name] is a 42 y.o. female with medical history significant of depression, GERD, tobacco abuse, substance abuse, s/p of C section on 07/25/17 (not doing breast-feeding currently) who presents with nausea, vomiting, abdominal pain. She was found to have SBO, with possible incarcerated hernia. She was also found to have severe hypokalemia and admitted to medical service, and surgery consulted.  Ob/GYN consulted by surgeon and she underwent laparoscopy with lysis of adhesions, small bowel resection   on 08/09/2017.     Assessment & Plan:   Principal Problem:   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 08/09/2017 Active Problems:   S/P cesarean section   SBO (small bowel obstruction) (HCC)   Tobacco abuse   GERD (gastroesophageal reflux disease)   Substance abuse (HCC)   Hypokalemia   Leukocytosis   Dehiscence of fascia s/p C/S 07/25/2017   SBO, incarcerated Hernia with ischemic bowel ; S/p lap with lysis of adhesions and bowel resection. By Dr Johney Maine on 08/09/2017.  Pain control.  IV fluids for hydration.    Hypokalemia: replaced. Repeat K TONIGHT.    Hyponatremia: mild probably from dehydration.    Tobacco abuse:  On nicotine patch.    S/p recent C section:  Ob gyn on board.    Anemia: Mild probably from acute anemia of blood loss.         DVT prophylaxis: scd's Code Status: full code.  Family Communication: none at bedside.  Disposition Plan: pending clinical improvement.   Consultants:   Surgery.   OB/gyn  Procedures:   Antimicrobials:    Subjective: Requesting pain medication for abdominal pain.   Objective: Vitals:   08/09/17 0608 08/09/17 1232 08/09/17 1245 08/09/17 1300  BP: 108/63 123/77 117/72 123/74  Pulse: 82 91 84 78  Resp: 16 10 19  (!) 21  Temp: 98.5 F (36.9 C) 97.6 F (36.4 C)      TempSrc: Oral     SpO2: (!) 86% 100% 95% 94%  Weight:      Height:        Intake/Output Summary (Last 24 hours) at 08/09/2017 1314 Last data filed at 08/09/2017 1236 Gross per 24 hour  Intake 4724.5 ml  Output 2050 ml  Net 2674.5 ml   Filed Weights   08/08/17 1956 08/09/17 0155  Weight: 55.8 kg (123 lb) 60.4 kg (133 lb 2.5 oz)    Examination:  General exam: sleepy with NG TUBE  Respiratory system: Clear to auscultation. Respiratory effort normal. Cardiovascular system: S1 & S2 heard, RRR. No JVD,  No pedal edema. Gastrointestinal system: Abdomen is distended, tender, bowel sounds minimal.  Central nervous system: sleepy, able to answer all questions on asking. , non focal.  Extremities: Symmetric 5 x 5 power. Skin: No rashes, lesions or ulcers Psychiatry:Mood & affect appropriate.     Data Reviewed: I have personally reviewed following labs and imaging studies  CBC: Recent Labs  Lab 08/08/17 2044 08/09/17 0240  WBC 16.3* 13.7*  NEUTROABS 12.2*  --   HGB 13.2 11.6*  HCT 38.7 34.7*  MCV 88.2 89.4  PLT 352 671   Basic Metabolic Panel: Recent Labs  Lab 08/08/17 2044 08/09/17 0240 08/09/17 0617  NA 133* 132* 133*  K 2.2* 2.7* 3.0*  CL 78* 81* 87*  CO2 31 37* 37*  GLUCOSE 122* 107* 94  BUN 22* 16 14  CREATININE  0.71 0.71 0.60  CALCIUM 8.9 8.3* 8.1*  MG  --  1.9  --    GFR: Estimated Creatinine Clearance: 79.1 mL/min (by C-G formula based on SCr of 0.6 mg/dL). Liver Function Tests: Recent Labs  Lab 08/08/17 2044 08/09/17 0240  AST 16 18  ALT 22 18  ALKPHOS 104 92  BILITOT 0.4 0.6  PROT 7.4 6.3*  ALBUMIN 3.6 3.0*   Recent Labs  Lab 08/08/17 2044  LIPASE 36   No results for input(s): AMMONIA in the last 168 hours. Coagulation Profile: Recent Labs  Lab 08/09/17 0240  INR 0.94   Cardiac Enzymes: No results for input(s): CKTOTAL, CKMB, CKMBINDEX, TROPONINI in the last 168 hours. BNP (last 3 results) No results for input(s): PROBNP in the  last 8760 hours. HbA1C: No results for input(s): HGBA1C in the last 72 hours. CBG: No results for input(s): GLUCAP in the last 168 hours. Lipid Profile: No results for input(s): CHOL, HDL, LDLCALC, TRIG, CHOLHDL, LDLDIRECT in the last 72 hours. Thyroid Function Tests: No results for input(s): TSH, T4TOTAL, FREET4, T3FREE, THYROIDAB in the last 72 hours. Anemia Panel: No results for input(s): VITAMINB12, FOLATE, FERRITIN, TIBC, IRON, RETICCTPCT in the last 72 hours. Sepsis Labs: Recent Labs  Lab 08/09/17 0240 08/09/17 0617  PROCALCITON <0.10  --   LATICACIDVEN 0.7 0.6    Recent Results (from the past 240 hour(s))  Culture, Urine     Status: Abnormal   Collection Time: 08/01/17  8:37 PM  Result Value Ref Range Status   Specimen Description   Final    URINE, CLEAN CATCH Performed at [facility] [address], [place], [place] [address]    Special Requests   Final    NONE Performed at [facility], [address], [place], [place] [address]    Culture (A)  Final    <10,000 COLONIES/mL INSIGNIFICANT GROWTH Performed at [facility], [place] [address], [place], [place] [address]    Report Status 08/03/2017 FINAL  Final  Culture, blood (Routine X 2) w Reflex to ID Panel     Status: None (Preliminary result)   Collection Time: 08/09/17  2:40 AM  Result Value Ref Range Status   Specimen Description BLOOD RIGHT ARM  Final   Special Requests   Final    BOTTLES DRAWN AEROBIC AND ANAEROBIC Blood Culture adequate volume Performed at [facility] [address], [place], [place] [address]    Culture PENDING  Incomplete   Report Status PENDING  Incomplete  MRSA PCR Screening     Status: None   Collection Time: 08/09/17  9:50 AM  Result Value Ref Range Status   MRSA by PCR NEGATIVE NEGATIVE Final    Comment:        The GeneXpert MRSA Assay (FDA approved for NASAL specimens only), is one component of a comprehensive MRSA  colonization surveillance program. It is not intended to diagnose MRSA infection nor to guide or monitor treatment for MRSA infections. Performed at [facility], [place] [address] [place],  [address]          Radiology Studies: Dg Abdomen 1 View  Result Date: 08/09/2017 CLINICAL DATA:  NG tube placement EXAM: ABDOMEN - 1 VIEW COMPARISON:  08/07/2017 FINDINGS: Enteric tube tip in the left upper quadrant consistent with location in the upper stomach. Persistent gas distention of left upper quadrant small bowel consistent with small bowel obstruction. Residual contrast material in the renal collecting systems and in the  bladder. Postoperative changes in the lower lumbar spine and right SI joint. Surgical clips in the right upper quadrant. IMPRESSION: Enteric tube tip is in the left upper quadrant consistent with location in the upper stomach. Gaseous distention of left upper quadrant small bowel consistent with small bowel obstruction. Electronically Signed   By: Lucienne Capers M.D.   On: 08/09/2017 01:36   Ct Abdomen Pelvis W Contrast  Result Date: 08/08/2017 CLINICAL DATA:  42 year old female with acute abdominal pain and vomiting. C-section on 07/25/2017. EXAM: CT ABDOMEN AND PELVIS WITH CONTRAST TECHNIQUE: Multidetector CT imaging of the abdomen and pelvis was performed using the standard protocol following bolus administration of intravenous contrast. CONTRAST:  186mL ISOVUE-300 IOPAMIDOL (ISOVUE-300) INJECTION 61% COMPARISON:  08/07/2017 radiographs.  05/20/2007 CT FINDINGS: Lower chest: No acute abnormality. Hepatobiliary: The liver is unremarkable. Patient is status post cholecystectomy. CBD dilatation to the ampulla is noted and measures 12 mm. Pancreas: Unremarkable Spleen: Unremarkable Adrenals/Urinary Tract: The kidneys, adrenal glands and bladder are unremarkable. Stomach/Bowel: Dilated proximal and mid small bowel loops with collapsed distal small bowel loops  are compatible with a high-grade small bowel obstruction. The transition point is likely located within the anterior pelvis which there is a suggestion of anterior abdominal/pelvic wall eventration or hernia. Fluid within the eventration/hernia sac noted. There is no evidence of pneumoperitoneum.  No abscess is identified. Vascular/Lymphatic: Aortic atherosclerosis. No enlarged abdominal or pelvic lymph nodes. Reproductive: Enlarged uterus with C-section changes noted. Other: No ascites noted. Musculoskeletal: No acute or suspicious bony abnormalities noted. LOWER lumbar and RIGHT SI joint effusion hardware identified. IMPRESSION: 1. High-grade small bowel obstruction with transition point likely within an anterior abdominal/pelvic wall eventration or hernia sac. No evidence of pneumoperitoneum. 2. Enlarged postpartum uterus with C-section changes. 3. CBD dilatation which could be related to small bowel obstruction. Electronically Signed   By: Margarette Canada M.D.   On: 08/08/2017 23:26   Dg Abd 2 Views  Result Date: 08/07/2017 CLINICAL DATA:  Abdominal bloating. Vomiting. Caesarean section on 07/25/2017. EXAM: ABDOMEN - 2 VIEW COMPARISON:  CT abdomen and pelvis 05/20/2007 FINDINGS: There is no evidence of intraperitoneal free air. There are multiple loops of dilated small bowel in the central and left abdomen measuring up to approximately 6 cm in diameter with some air-fluid levels. Stool and a small amount of gas are present in the colon. Sequelae of prior lumbar and right sacroiliac fusion are noted. Surgical clips are present in the right upper abdomen. The visualized lung bases are clear. IMPRESSION: Moderate small bowel dilatation concerning for obstruction. These results will be called to the ordering clinician or representative by the Radiology Department at the imaging location. Electronically Signed   By: Logan Bores M.D.   On: 08/07/2017 18:12        Scheduled Meds: . [MAR Hold] bupivacaine  liposome  20 mL Infiltration On Call to OR  . [MAR Hold] buprenorphine-naloxone  1 tablet Sublingual Daily  . [MAR Hold] clindamycin / gentamicin INTRAPERITONEAL Lavage irrigation   Intraperitoneal On Call to OR  . HYDROmorphone      . [MAR Hold] nicotine  21 mg Transdermal Daily  . [MAR Hold] scopolamine  1 patch Transdermal Q72H  . scopolamine       Continuous Infusions: . sodium chloride 125 mL/hr at 08/09/17 0652  . acetaminophen    . acetaminophen    . [MAR Hold] acetaminophen    . [MAR Hold] famotidine (PEPCID) IV Stopped (08/09/17 0455)  . [MAR Hold] lactated  ringers       LOS: 1 day    Time spent: 35 minutes    Hosie Poisson, MD [facility] Pager [phone]  If 7PM-7AM, please contact night-coverage [email or web address] Password [email or web address] 08/09/2017, 1:14 PM

## 2017-08-09 NOTE — Progress Notes (Signed)
Patient evaluated by Dr. Johney Maine  NG tube in  - feels "miserable"  BP 108/63 (BP Location: Right Arm)   Pulse 82   Temp 98.5 F (36.9 C) (Oral)   Resp 16   Ht 5\' 4"  (1.626 m)   Wt 60.4 kg (133 lb 2.5 oz)   LMP  (LMP Unknown)   SpO2 (!) 86%   Breastfeeding? No   BMI 22.86 kg/m  Results for orders placed or performed during the hospital encounter of 08/08/17 (from the past 24 hour(s))  Urinalysis, Routine w reflex microscopic     Status: Abnormal   Collection Time: 08/08/17 11:16 PM  Result Value Ref Range   Color, Urine YELLOW YELLOW   APPearance CLEAR CLEAR   Specific Gravity, Urine >1.046 (H) 1.005 - 1.030   pH 5.0 5.0 - 8.0   Glucose, UA NEGATIVE NEGATIVE mg/dL   Hgb urine dipstick LARGE (A) NEGATIVE   Bilirubin Urine NEGATIVE NEGATIVE   Ketones, ur NEGATIVE NEGATIVE mg/dL   Protein, ur NEGATIVE NEGATIVE mg/dL   Nitrite NEGATIVE NEGATIVE   Leukocytes, UA NEGATIVE NEGATIVE   RBC / HPF >50 (H) 0 - 5 RBC/hpf   WBC, UA 0-5 0 - 5 WBC/hpf   Bacteria, UA NONE SEEN NONE SEEN   Squamous Epithelial / LPF 0-5 0 - 5  ABO/Rh     Status: None   Collection Time: 08/09/17  2:37 AM  Result Value Ref Range   ABO/RH(D)      B POS Performed at [facility], [place] [address] [place], [place] [address]   Comprehensive metabolic panel     Status: Abnormal   Collection Time: 08/09/17  2:40 AM  Result Value Ref Range   Sodium 132 (L) 135 - 145 mmol/L   Potassium 2.7 (LL) 3.5 - 5.1 mmol/L   Chloride 81 (L) 98 - 111 mmol/L   CO2 37 (H) 22 - 32 mmol/L   Glucose, Bld 107 (H) 70 - 99 mg/dL   BUN 16 6 - 20 mg/dL   Creatinine, Ser 0.71 0.44 - 1.00 mg/dL   Calcium 8.3 (L) 8.9 - 10.3 mg/dL   Total Protein 6.3 (L) 6.5 - 8.1 g/dL   Albumin 3.0 (L) 3.5 - 5.0 g/dL   AST 18 15 - 41 U/L   ALT 18 0 - 44 U/L   Alkaline Phosphatase 92 38 - 126 U/L   Total Bilirubin 0.6 0.3 - 1.2 mg/dL   GFR calc non Af Amer >60 >60 mL/min   GFR calc Af Amer >60 >60 mL/min   Anion gap 14 5 - 15   Magnesium     Status: None   Collection Time: 08/09/17  2:40 AM  Result Value Ref Range   Magnesium 1.9 1.7 - 2.4 mg/dL  CBC     Status: Abnormal   Collection Time: 08/09/17  2:40 AM  Result Value Ref Range   WBC 13.7 (H) 4.0 - 10.5 K/uL   RBC 3.88 3.87 - 5.11 MIL/uL   Hemoglobin 11.6 (L) 12.0 - 15.0 g/dL   HCT 34.7 (L) 36.0 - 46.0 %   MCV 89.4 78.0 - 100.0 fL   MCH 29.9 26.0 - 34.0 pg   MCHC 33.4 30.0 - 36.0 g/dL   RDW 12.6 11.5 - 15.5 %   Platelets 308 150 - 400 K/uL  Culture, blood (Routine X 2) w Reflex to ID Panel     Status: None (Preliminary result)   Collection Time: 08/09/17  2:40 AM  Result Value Ref Range   Specimen Description BLOOD RIGHT ARM    Special Requests      BOTTLES DRAWN AEROBIC AND ANAEROBIC Blood Culture adequate volume Performed at [facility] [address], [place], [place] [address]    Culture PENDING    Report Status PENDING   Type and screen [place]     Status: None   Collection Time: 08/09/17  2:40 AM  Result Value Ref Range   ABO/RH(D) B POS    Antibody Screen NEG    Sample Expiration      08/12/2017 Performed at [facility], [place] [address], [place], [place] [address]   Procalcitonin     Status: None   Collection Time: 08/09/17  2:40 AM  Result Value Ref Range   Procalcitonin <0.10 ng/mL  Protime-INR     Status: None   Collection Time: 08/09/17  2:40 AM  Result Value Ref Range   Prothrombin Time 12.5 11.4 - 15.2 seconds   INR 0.94   APTT     Status: None   Collection Time: 08/09/17  2:40 AM  Result Value Ref Range   aPTT 31 24 - 36 seconds  Lactic acid, plasma     Status: None   Collection Time: 08/09/17  2:40 AM  Result Value Ref Range   Lactic Acid, Venous 0.7 0.5 - 1.9 mmol/L  Basic metabolic panel     Status: Abnormal   Collection Time: 08/09/17  6:17 AM  Result Value Ref Range   Sodium 133 (L) 135 - 145 mmol/L   Potassium 3.0 (L) 3.5 - 5.1 mmol/L   Chloride  87 (L) 98 - 111 mmol/L   CO2 37 (H) 22 - 32 mmol/L   Glucose, Bld 94 70 - 99 mg/dL   BUN 14 6 - 20 mg/dL   Creatinine, Ser 0.60 0.44 - 1.00 mg/dL   Calcium 8.1 (L) 8.9 - 10.3 mg/dL   GFR calc non Af Amer >60 >60 mL/min   GFR calc Af Amer >60 >60 mL/min   Anion gap 9 5 - 15  Lactic acid, plasma     Status: None   Collection Time: 08/09/17  6:17 AM  Result Value Ref Range   Lactic Acid, Venous 0.6 0.5 - 1.9 mmol/L   Abdomen  Tender  Distended especially above incision Incision is clean and dry and intact  Post op SBO - probable incarcerated hernia Discussion with Dr. Johney Maine  Electrolytes are improved - to go to OR this morning I will be available to assist - he will contact me when she goes to OR

## 2017-08-09 NOTE — Op Note (Addendum)
08/09/2017  12:19 PM  PATIENT:  [name]  42 y.o. female  Patient Care Team: Kathyrn Lass, MD as PCP - General (Family Medicine)  PRE-OPERATIVE DIAGNOSIS:  Bowel Obstruction  POST-OPERATIVE DIAGNOSIS:  INCARCERATED INCISIONAL HERNIA WITH ISCHEMIC BOWEL  PROCEDURE:   DIAGNOSTIC LAPAROSCOPY LYSIS OF ADHESIONS X 60 MIN SMALL BOWEL RESECTION FASCIAL CLOSURE BILATERAL TAP BLOCK  SURGEON:  Adin Hector, MD  ASSISTANT: Gardiner Ramus, MD  An experienced assistant was required given the standard of surgical care given the complexity of the case.  This assistant was needed for exposure, dissection, suctioning, retraction, bowel resection, etc.  ANESTHESIA:   local and general  Bilateral TAP block with 0.25% bupivacaine & Experel  EBL:  Total I/O In: 1300 [I.V.:1300] Out: 550 [Urine:400; Blood:150].  See anesthesia record  Delay start of Pharmacological VTE agent (>24hrs) due to surgical blood loss or risk of bleeding:  no  DRAINS: (19Fr) Blake drain(s) in the retrorectus space of the abdominal wall   SPECIMEN:  Source of Specimen:  Jejunum  DISPOSITION OF SPECIMEN:  PATHOLOGY  COUNTS:  YES  PLAN OF CARE: Admit to inpatient   PATIENT DISPOSITION:  PACU - hemodynamically stable.  INDICATION: Woman status post C-section on July 25, 2017 by Dr Matthew Saras.  She has struggled with abdominal pain and intermittent nausea and vomiting for the past 2 weeks.  Became much more intense.  Readmitted.  CT scan concerning for small bowel obstruction within the abdominal wall concerning for incarcerated hernia.  Hypokalemic.  Dehydrated.  Admitted.  Hydrated.  Dr. Delanna Ahmadi group aware.  Dr. Helane Rima, his partner, here this morning.  Surgical consultation made given bowel obstruction.  Recommendation made for operative exploration by myself and Dr. Helane Rima.  Diagnostic laparoscopy with probable laparotomy.  Need for urgent operation discussed.  Patient wishes to proceed.  The anatomy &  physiology of the digestive tract was discussed.  The pathophysiology of perforation was discussed.  Differential diagnosis such as perforated ulcer or colon, etc was discussed.   Natural history risks without surgery such as death was discussed.  I recommended abdominal exploration to diagnose & treat the source of the problem.  Laparoscopic & open techniques were discussed.   Risks such as bleeding, infection, abscess, leak, reoperation, bowel resection, possible ostomy, injury to other organs, need for repair of tissues / organs, hernia, heart attack, death, and other risks were discussed.   The risks of no intervention will lead to serious problems including death.   I expressed a good likelihood that surgery will address the problem.    Goals of post-operative recovery were discussed as well.  We will work to minimize complications although risks in an emergent setting are high.   Questions were answered.  The patient expressed understanding & wishes to proceed with surgery.      OR FINDINGS: Posterior rectal fascial dehiscence infraumbilically with 6 cm infraumbilical vertical defect.  Transverse anterior rectal fascia closure intact.   20cm of jejunum incarcerated in the retrorectus space.  Transition point of a chronic small bowel obstruction.  Proximal end of the incarcerated bowel with thickening narrowing and ischemia c/w strangulation.  Jejunal mesentery incarcerated with ischemia and thrombosis.  Therefore small bowel resected.  No evidence of gangrene or perforation.  No intra-abdominal adhesions.  DESCRIPTION:   Informed consent was confirmed.  The patient underwent general anaesthesia without difficulty.  The patient was positioned appropriately.  VTE prevention in place.  The patient's abdomen was clipped, prepped, & draped in  a sterile fashion.  Surgical timeout confirmed our plan.  Peritoneal entry with a laparoscopic port was obtained using optical entry technique in the left upper  abdomen as the patient was positioned in reverse Trendelenburg.  Entry was clean.  I induced carbon dioxide insufflation.  Camera inspection revealed no injury.  Extra ports were carefully placed under direct laparoscopic visualization.  We could immediately see a loop of small intestine going up into a midline peritoneal defect infraumbilically.  This was the transition point.  No other intra-abdominal adhesions.  No peritonitis.  Tried to laparoscopically reduce the small bowel but it was quite stuck.  We felt it was not safe to try and try more aggressive laparoscopic reduction.  Therefore we did entry through the prior suprapubic Pfannenstiel incision.  Came through the skin and subcutaneous tissues.  Remove the PDS suture on the transverse anterior rectal closure.  Immediately encountered and evacuated a large seroma with small bowel in the retrorectus space.  Some ischemia and thickening noted.  No frank perforation or gangrene.  No abscess or purulence.  Dense adhesions to rectus muscle, fascia and retrorectus fascia.  We sharply freed off these adhesions circumferentially until we are came to the retrorectus fascia defect slightly infraumbilical.  Was able to open up this defect and relieve the area of incarceration and strangulation.  Sharply freed off adhesions of the small bowel until it could be further eviscerated out.  It was obvious this was a transition point.  There were 2 sharply kinked off areas in the small bowel.  The more proximal dilated and was severely strictured, thickened, and with purplish discoloration and significant ischemia.  At least part of the jejunal mesentery was thrombosed and ischemic.  Probably necrosed I did not feel this loop of bowel was not salvageable as it did not pink up after release.  Therefore I did a small bowel resection.  Used a 75 GIA stapler to do a side-to-side stapled anastomosis.  Used a TX 90 to transect the staple defect.  Transected the mesentery with  clamps and silk ties.  Closed the mesenteric defect transversely to cover up the [place] staple line of the anastomosis with interrupted silk suture.  Placed another silk suture at the proximal crotch of the anastomosis.  Anastomosis was open and patent.  Viable.  We allowed the small bowel to reduce in the abdomen.  We changed gloves and switched to clean instruments.  I did copious irrigation the peritoneal cavity with isotonic solution numerous liters.  We closed the posterior rectus fascia vertically with 0 PDS running suture.  Washed out to the retrorectus large seroma cavity with serial aliquots of antibiotic irrigation (clindamycin/gentamicin).  Closed the anterior rectus fascia transversely with #1 looped PDS running from each corner.  Because the retrorectus space was quite dilated from the large seroma, I decided to leave a drain in this region.  Tunneled it through the abdominal wall out the left lower quadrant 5 mm port site.  [place] drain.  Secured to the skin with 2-0 Prolene suture.  We did diagnostic laparoscopy and confirmed we had a good intact fascial closure.  No exposed drain.  No defects.  Nothing trapped in the closure.  Inspected the small bowel from the ileocecal valve to the ligament of Treitz.  No other abnormalities.  Anastomosis laid well.  Noninflamed.  No injury other abnormality.  Patient had very little thin greater omentum that could barely reach down to the umbilicus.  Carbon dioxide  was evacuated.  Laparoscopic ports removed.  Skin closed at the port sites with Monocryl suture.  Pfannenstiel incision closed with running 4-0 Monocryl suture with gaps at the corners and then allowed antibiotic soaked umbilical tape wicks.  Sterile dressings applied.  Dr. Runell Gess was present & assisted for the entire case.  The patient extubated and sent to recovery room in stable condition.  I made an attempt to locate family to discuss patient's status and recommendations.  No one is  available at this time.  I will try again later.      Adin Hector, M.D., F.A.C.S. Gastrointestinal and Minimally Invasive Surgery [facility] [address], [place], [place] [address] [phone] Main / Paging

## 2017-08-09 NOTE — Progress Notes (Addendum)
CRITICAL VALUE ALERT  Critical Value:  K 2.7  Date & Time Notied:  08/09/17 0345  Provider Notified: Schorr, NP  Orders Received/Actions taken: Pt currently receiving 5 runs of K. 3 additional runs ordered.

## 2017-08-09 NOTE — ED Notes (Signed)
Dr. Excell Seltzer with [facility] is here to see pt at this time

## 2017-08-09 NOTE — Transfer of Care (Signed)
Immediate Anesthesia Transfer of Care Note  Patient: [name]  Procedure(s) Performed: LAPAROSCOPY, LYSIS OF ADHESIONS, SMALL BOWEL RESECTION, FASCIAL CLOSURE, BILATERAL TAP BLOCK (N/A Abdomen)  Patient Location: PACU  Anesthesia Type:General  Level of Consciousness: awake and patient cooperative  Airway & Oxygen Therapy: Patient Spontanous Breathing and Patient connected to face mask oxygen  Post-op Assessment: Report given to RN and Post -op Vital signs reviewed and stable  Post vital signs: Reviewed and stable  Last Vitals:  Vitals Value Taken Time  BP 123/77 08/09/2017 12:32 PM  Temp    Pulse 86 08/09/2017 12:35 PM  Resp 19 08/09/2017 12:35 PM  SpO2 100 % 08/09/2017 12:35 PM  Vitals shown include unvalidated device data.  Last Pain:  Vitals:   08/09/17 0814  TempSrc:   PainSc: Asleep      Patients Stated Pain Goal: 1 (69/67/89 3810)  Complications: No apparent anesthesia complications

## 2017-08-09 NOTE — H&P (Addendum)
[name] is an 42 y.o. female.   Chief Complaint: Nausea and vomiting, abdominal pain  HPI: Patient is a 42 year old female status post C-section 07/25/2017.  She was discharged home 07/28/2017.  Patient states that she had nausea and occasional emesis during that hospitalization.  She returned to maternity admissions on 6/26 with persistent nausea and vomiting and weakness.  She was treated with IV fluids and antiemetics and discharged.  Her symptoms continued.  She describes intermittent severe crampy abdominal pain followed by vomiting.  This is worse when she tries to eat or drink but could occur without this.  This happens at least several times per day.  She has felt gradually weaker.  She returned to but herniated admissions I believe yesterday.  Abdominal plain x-ray showed small bowel obstruction.  She was asked to return to [facility] hospital today for further evaluation.  CT scan was obtained as described below.  The patient states that she was taken up to her room but then told there was not room in the hospital and was transferred to [facility] long emergency department and I was called by the emergency physician.  She has no previous history of significant GI problems.  Other abdominal surgery includes cholecystectomy and diagnostic laparoscopy.  She has had some flatus and a couple of small bowel movements.  Past Medical History:  Diagnosis Date  . AMA (advanced maternal age) multigravida 32+   . Arthritis   . Chronic back pain   . Depression    no meds   . Hx of eating disorder   . Numbness    right leg  . Substance abuse ([place])    since 2009    Past Surgical History:  Procedure Laterality Date  . BREAST SURGERY     augmentation  . BUNIONECTOMY Bilateral    screws  . CESAREAN SECTION N/A 02/28/2015   Procedure: CESAREAN SECTION;  Surgeon: Linda Hedges, DO;  Location: [facility];  Service: Obstetrics;  Laterality: N/A;  . CESAREAN SECTION N/A 07/25/2017   Procedure: REPEAT  CESAREAN SECTION;  Surgeon: Molli Posey, MD;  Location: [facility];  Service: Obstetrics;  Laterality: N/A;  Repeat edc 08/18/17 NKDA  . CHOLECYSTECTOMY    . DIAGNOSTIC LAPAROSCOPY    . I fuse     second back surgery states this is the name of the procedure  . LUMBAR FUSION      Family History  Problem Relation Age of Onset  . Hypertension Father   . Arthritis Father    Social History:  reports that she has been smoking cigarettes.  She has been smoking about 0.25 packs per day. She has never used smokeless tobacco. She reports that she does not drink alcohol or use drugs.  Allergies:  Allergies  Allergen Reactions  . Chantix [Varenicline]     hives    Current Facility-Administered Medications:  .  lidocaine (XYLOCAINE) 2 % jelly 1 application, 1 application, Other, Once, Tyrone Nine, Dan, DO .  potassium chloride 10 mEq in 100 mL IVPB, 10 mEq, Intravenous, Q1 Hr x 5, Floyd, Dan, DO, Stopped at 08/09/17 0014  Current Outpatient Medications:  .  buprenorphine (SUBUTEX) 8 MG SUBL SL tablet, Place 8 mg under the tongue 2 (two) times daily. , Disp: , Rfl: 0 .  cephALEXin (KEFLEX) 500 MG capsule, Take 1 capsule (500 mg total) by mouth 4 (four) times daily., Disp: 28 capsule, Rfl: 0 .  famotidine (PEPCID) 20 MG tablet, Take 1 tablet (20 mg total)  by mouth 2 (two) times daily. (Patient not taking: Reported on 08/08/2017), Disp: 60 tablet, Rfl: 2 .  ibuprofen (ADVIL,MOTRIN) 800 MG tablet, Take 1 tablet (800 mg total) by mouth every 8 (eight) hours. (Patient not taking: Reported on 08/08/2017), Disp: 30 tablet, Rfl: 0 .  lidocaine (LIDODERM) 5 %, Place 1 patch onto the skin daily. Remove & Discard patch within 12 hours or as directed by MD (Patient not taking: Reported on 08/08/2017), Disp: 2 patch, Rfl: 0 .  oxyCODONE-acetaminophen (PERCOCET/ROXICET) 5-325 MG tablet, Take 1 tablet by mouth every 4 (four) hours as needed (pain scale 4-7). (Patient not taking: Reported on 08/08/2017), Disp: 20  tablet, Rfl: 0 .  promethazine (PHENERGAN) 25 MG tablet, Take 1 tablet (25 mg total) by mouth every 6 (six) hours as needed for nausea or vomiting. (Patient not taking: Reported on 08/08/2017), Disp: 30 tablet, Rfl: 2  Facility-Administered Medications Ordered in Other Encounters:  .  iopamidol (ISOVUE-300) 61 % injection, , , ,   Results for orders placed or performed during the hospital encounter of 08/08/17 (from the past 48 hour(s))  CBC with Differential     Status: Abnormal   Collection Time: 08/08/17  8:44 PM  Result Value Ref Range   WBC 16.3 (H) 4.0 - 10.5 K/uL   RBC 4.39 3.87 - 5.11 MIL/uL   Hemoglobin 13.2 12.0 - 15.0 g/dL   HCT 38.7 36.0 - 46.0 %   MCV 88.2 78.0 - 100.0 fL   MCH 30.1 26.0 - 34.0 pg   MCHC 34.1 30.0 - 36.0 g/dL   RDW 12.6 11.5 - 15.5 %   Platelets 352 150 - 400 K/uL   Neutrophils Relative % 75 %   Lymphocytes Relative 14 %   Monocytes Relative 10 %   Eosinophils Relative 1 %   Basophils Relative 0 %   Neutro Abs 12.2 (H) 1.7 - 7.7 K/uL   Lymphs Abs 2.3 0.7 - 4.0 K/uL   Monocytes Absolute 1.6 (H) 0.1 - 1.0 K/uL   Eosinophils Absolute 0.2 0.0 - 0.7 K/uL   Basophils Absolute 0.0 0.0 - 0.1 K/uL   Smear Review MORPHOLOGY UNREMARKABLE     Comment: Performed at [facility], [place] [address], [place], [place] [address]  Comprehensive metabolic panel     Status: Abnormal   Collection Time: 08/08/17  8:44 PM  Result Value Ref Range   Sodium 133 (L) 135 - 145 mmol/L   Potassium 2.2 (LL) 3.5 - 5.1 mmol/L    Comment: CRITICAL RESULT CALLED TO, READ BACK BY AND VERIFIED WITH: B JESSEE RN 08/08/17 2135 A NAVARRO    Chloride 78 (L) 98 - 111 mmol/L    Comment: Please note change in reference range.   CO2 31 22 - 32 mmol/L   Glucose, Bld 122 (H) 70 - 99 mg/dL    Comment: Please note change in reference range.   BUN 22 (H) 6 - 20 mg/dL    Comment: Please note change in reference range.   Creatinine, Ser 0.71 0.44 - 1.00 mg/dL   Calcium 8.9 8.9 -  10.3 mg/dL   Total Protein 7.4 6.5 - 8.1 g/dL   Albumin 3.6 3.5 - 5.0 g/dL   AST 16 15 - 41 U/L   ALT 22 0 - 44 U/L    Comment: Please note change in reference range.   Alkaline Phosphatase 104 38 - 126 U/L   Total Bilirubin 0.4 0.3 - 1.2 mg/dL   GFR calc non  Af Amer >60 >60 mL/min   GFR calc Af Amer >60 >60 mL/min    Comment: (NOTE) The eGFR has been calculated using the CKD EPI equation. This calculation has not been validated in all clinical situations. eGFR's persistently <60 mL/min signify possible Chronic Kidney Disease.    Anion gap 24 (H) 5 - 15    Comment: Performed at [facility], [place] [address], [place], [place] [address]  Lipase, blood     Status: None   Collection Time: 08/08/17  8:44 PM  Result Value Ref Range   Lipase 36 11 - 51 U/L    Comment: Performed at [facility], [place] [address], [place], [place] [address]   Ct Abdomen Pelvis W Contrast  Result Date: 08/08/2017 CLINICAL DATA:  42 year old female with acute abdominal pain and vomiting. C-section on 07/25/2017. EXAM: CT ABDOMEN AND PELVIS WITH CONTRAST TECHNIQUE: Multidetector CT imaging of the abdomen and pelvis was performed using the standard protocol following bolus administration of intravenous contrast. CONTRAST:  125m ISOVUE-300 IOPAMIDOL (ISOVUE-300) INJECTION 61% COMPARISON:  08/07/2017 radiographs.  05/20/2007 CT FINDINGS: Lower chest: No acute abnormality. Hepatobiliary: The liver is unremarkable. Patient is status post cholecystectomy. CBD dilatation to the ampulla is noted and measures 12 mm. Pancreas: Unremarkable Spleen: Unremarkable Adrenals/Urinary Tract: The kidneys, adrenal glands and bladder are unremarkable. Stomach/Bowel: Dilated proximal and mid small bowel loops with collapsed distal small bowel loops are compatible with a high-grade small bowel obstruction. The transition point is likely located within the anterior pelvis which there is a suggestion of  anterior abdominal/pelvic wall eventration or hernia. Fluid within the eventration/hernia sac noted. There is no evidence of pneumoperitoneum.  No abscess is identified. Vascular/Lymphatic: Aortic atherosclerosis. No enlarged abdominal or pelvic lymph nodes. Reproductive: Enlarged uterus with C-section changes noted. Other: No ascites noted. Musculoskeletal: No acute or suspicious bony abnormalities noted. LOWER lumbar and RIGHT SI joint effusion hardware identified. IMPRESSION: 1. High-grade small bowel obstruction with transition point likely within an anterior abdominal/pelvic wall eventration or hernia sac. No evidence of pneumoperitoneum. 2. Enlarged postpartum uterus with C-section changes. 3. CBD dilatation which could be related to small bowel obstruction. Electronically Signed   By: JMargarette CanadaM.D.   On: 08/08/2017 23:26   Dg Abd 2 Views  Result Date: 08/07/2017 CLINICAL DATA:  Abdominal bloating. Vomiting. Caesarean section on 07/25/2017. EXAM: ABDOMEN - 2 VIEW COMPARISON:  CT abdomen and pelvis 05/20/2007 FINDINGS: There is no evidence of intraperitoneal free air. There are multiple loops of dilated small bowel in the central and left abdomen measuring up to approximately 6 cm in diameter with some air-fluid levels. Stool and a small amount of gas are present in the colon. Sequelae of prior lumbar and right sacroiliac fusion are noted. Surgical clips are present in the right upper abdomen. The visualized lung bases are clear. IMPRESSION: Moderate small bowel dilatation concerning for obstruction. These results will be called to the ordering clinician or representative by the Radiology Department at the imaging location. Electronically Signed   By: ALogan BoresM.D.   On: 08/07/2017 18:12    Review of Systems  Constitutional: Positive for malaise/fatigue. Negative for chills and fever.  Respiratory: Negative.   Cardiovascular: Negative.   Gastrointestinal: Positive for abdominal pain, nausea and  vomiting. Negative for blood in stool.  Genitourinary: Negative.   Musculoskeletal: Positive for back pain.  Neurological: Positive for dizziness and weakness.    Blood pressure 109/74, pulse 87, temperature 98.3 F (36.8 C),  temperature source Oral, resp. rate 10, height '5\' 4"'$  (1.626 m), weight 55.8 kg (123 lb), SpO2 96 %, not currently breastfeeding. Physical Exam  General: Thin Caucasian female, appears fatigued, in no acute distress Skin: Warm and dry without rash or infection. HEENT: No palpable masses or thyromegaly. Sclera nonicteric. Pupils equal round and reactive.  Lymph nodes: No cervical, supraclavicular, or inguinal nodes palpable. Lungs: Breath sounds clear and equal without increased work of breathing Cardiovascular: Regular rate and rhythm without murmur. No JVD or edema.  Abdomen: Moderately distended.Marland Kitchen  Healing Pfannenstiel incision.  There is fullness and mild to moderate tenderness with borborygmi to pressure above the Pfannenstiel incision.   Extremities: No edema or joint swelling or deformity. No chronic venous stasis changes. Neurologic: Alert and fully oriented.  No gross motor deficits.  Assessment/Plan Small bowel obstruction status post recent C-section with CT scan and physical exam consistent with herniated small bowel into the abdominal wall resulting in high-grade obstruction. She is significantly dehydrated with severe hypokalemia and some degree of hypochloremic alkalosis  I discussed the patient with Dr. Corinna Capra on-call for OB/GYN.  He did not feel comfortable admitting or evaluating the patient.  I have discussed with Dr.Niu, medical hospitalist service, who has kindly agreed to admit the patient due to her severe electrolyte and metabolic problems and chronic back pain issues.  She will require exploration of her wound and reduction of the acute hernia.  I doubt there is ischemia based on physical exam.  I do not believe it would be safe to take the patient to  the operating room tonight with her severe hypokalemia.  She will be rehydrated and received potassium replacement.  NG tube being placed.  Tentatively plan exploration later today.  Discussed with the patient and all her questions answered.  Edward Jolly, MD 08/09/2017, 12:46 AM

## 2017-08-09 NOTE — Plan of Care (Signed)
  Problem: Education: Goal: Knowledge of General Education information will improve Outcome: Progressing   Problem: Health Behavior/Discharge Planning: Goal: Ability to manage health-related needs will improve Outcome: Progressing   Problem: Clinical Measurements: Goal: Ability to maintain clinical measurements within normal limits will improve Outcome: Progressing Goal: Will remain free from infection Outcome: Progressing Goal: Diagnostic test results will improve Outcome: Progressing Goal: Respiratory complications will improve Outcome: Progressing Goal: Cardiovascular complication will be avoided Outcome: Progressing   Problem: Activity: Goal: Risk for activity intolerance will decrease Outcome: Progressing   Problem: Nutrition: Goal: Adequate nutrition will be maintained Outcome: Progressing   Problem: Coping: Goal: Level of anxiety will decrease Outcome: Progressing   Problem: Elimination: Goal: Will not experience complications related to bowel motility Outcome: Progressing Goal: Will not experience complications related to urinary retention Outcome: Progressing   Problem: Pain Managment: Goal: General experience of comfort will improve Outcome: Progressing   Problem: Skin Integrity: Goal: Risk for impaired skin integrity will decrease Outcome: Progressing

## 2017-08-09 NOTE — ED Notes (Signed)
ED TO INPATIENT HANDOFF REPORT  Name/Age/Gender [name] 42 y.o. female  Code Status Code Status History    Date Active Date Inactive Code Status Order ID Comments User Context   07/25/2017 1241 07/28/2017 2036 Full Code [id number]  Molli Posey, MD Inpatient   04/06/2017 0510 04/06/2017 1458 Full Code [id number]  Seabron Spates, [place] Inpatient   02/28/2015 1335 03/02/2015 1532 Full Code [id number]  Linda Hedges, DO Inpatient   02/28/2015 0730 02/28/2015 1326 Full Code [id number]  Linda Hedges, DO Inpatient   06/04/2014 1828 06/06/2014 1359 Full Code [id number]  Justice Britain, PA-C Inpatient   04/08/2011 0251 04/08/2011 1520 Full Code [id number]  Shaune Pollack, MD ED      Home/SNF/Other Home  Chief Complaint PP POST OP ABD PAIN  Level of Care/Admitting Diagnosis ED Disposition    ED Disposition Condition Comment   [facility] Area: [place] [[id number]]  Level of Care: Telemetry [5]  Admit to tele based on following criteria: Other see comments  Comments: Hypokalemia  Diagnosis: SBO (small bowel obstruction) [facility]) [[id number]]  Admitting Physician: Virgel Manifold  Attending Physician: Ivor Costa [phone]  Estimated length of stay: past midnight tomorrow  Certification:: I certify this patient will need inpatient services for at least 2 midnights  PT Class (Do Not Modify): Inpatient [101]  PT Acc Code (Do Not Modify): Private [1]       Medical History Past Medical History:  Diagnosis Date  . AMA (advanced maternal age) multigravida 34+   . Arthritis   . Chronic back pain   . Depression    no meds   . Hx of eating disorder   . Numbness    right leg  . Substance abuse ([place])    since 2009    Allergies Allergies  Allergen Reactions  . Chantix [Varenicline]     hives    IV Location/Drains/Wounds Patient Lines/Drains/Airways Status   Active Line/Drains/Airways    Name:   Placement date:   Placement time:   Site:   Days:   Peripheral IV  08/08/17 Left Wrist   08/08/17    2045    Wrist   1   Incision (Closed) 07/25/17 Abdomen   07/25/17    0913     15   Incision (Closed) 07/25/17 Vagina   07/25/17    0913     15          Labs/Imaging Results for orders placed or performed during the hospital encounter of 08/08/17 (from the past 48 hour(s))  CBC with Differential     Status: Abnormal   Collection Time: 08/08/17  8:44 PM  Result Value Ref Range   WBC 16.3 (H) 4.0 - 10.5 K/uL   RBC 4.39 3.87 - 5.11 MIL/uL   Hemoglobin 13.2 12.0 - 15.0 g/dL   HCT 38.7 36.0 - 46.0 %   MCV 88.2 78.0 - 100.0 fL   MCH 30.1 26.0 - 34.0 pg   MCHC 34.1 30.0 - 36.0 g/dL   RDW 12.6 11.5 - 15.5 %   Platelets 352 150 - 400 K/uL   Neutrophils Relative % 75 %   Lymphocytes Relative 14 %   Monocytes Relative 10 %   Eosinophils Relative 1 %   Basophils Relative 0 %   Neutro Abs 12.2 (H) 1.7 - 7.7 K/uL   Lymphs Abs 2.3 0.7 - 4.0 K/uL   Monocytes Absolute 1.6 (H) 0.1 - 1.0 K/uL   Eosinophils Absolute 0.2  0.0 - 0.7 K/uL   Basophils Absolute 0.0 0.0 - 0.1 K/uL   Smear Review MORPHOLOGY UNREMARKABLE     Comment: Performed at [facility], [place] [address] [place], [place] [address]  Comprehensive metabolic panel     Status: Abnormal   Collection Time: 08/08/17  8:44 PM  Result Value Ref Range   Sodium 133 (L) 135 - 145 mmol/L   Potassium 2.2 (LL) 3.5 - 5.1 mmol/L    Comment: CRITICAL RESULT CALLED TO, READ BACK BY AND VERIFIED WITH: B JESSEE RN 08/08/17 2135 A NAVARRO    Chloride 78 (L) 98 - 111 mmol/L    Comment: Please note change in reference range.   CO2 31 22 - 32 mmol/L   Glucose, Bld 122 (H) 70 - 99 mg/dL    Comment: Please note change in reference range.   BUN 22 (H) 6 - 20 mg/dL    Comment: Please note change in reference range.   Creatinine, Ser 0.71 0.44 - 1.00 mg/dL   Calcium 8.9 8.9 - 10.3 mg/dL   Total Protein 7.4 6.5 - 8.1 g/dL   Albumin 3.6 3.5 - 5.0 g/dL   AST 16 15 - 41 U/L   ALT 22 0 - 44 U/L     Comment: Please note change in reference range.   Alkaline Phosphatase 104 38 - 126 U/L   Total Bilirubin 0.4 0.3 - 1.2 mg/dL   GFR calc non Af Amer >60 >60 mL/min   GFR calc Af Amer >60 >60 mL/min    Comment: (NOTE) The eGFR has been calculated using the CKD EPI equation. This calculation has not been validated in all clinical situations. eGFR's persistently <60 mL/min signify possible Chronic Kidney Disease.    Anion gap 24 (H) 5 - 15    Comment: Performed at [facility], [place] [address] [place], [place] [address]  Lipase, blood     Status: None   Collection Time: 08/08/17  8:44 PM  Result Value Ref Range   Lipase 36 11 - 51 U/L    Comment: Performed at [facility], [place] [address] [place], [place] [address]   Ct Abdomen Pelvis W Contrast  Result Date: 08/08/2017 CLINICAL DATA:  42 year old female with acute abdominal pain and vomiting. C-section on 07/25/2017. EXAM: CT ABDOMEN AND PELVIS WITH CONTRAST TECHNIQUE: Multidetector CT imaging of the abdomen and pelvis was performed using the standard protocol following bolus administration of intravenous contrast. CONTRAST:  112m ISOVUE-300 IOPAMIDOL (ISOVUE-300) INJECTION 61% COMPARISON:  08/07/2017 radiographs.  05/20/2007 CT FINDINGS: Lower chest: No acute abnormality. Hepatobiliary: The liver is unremarkable. Patient is status post cholecystectomy. CBD dilatation to the ampulla is noted and measures 12 mm. Pancreas: Unremarkable Spleen: Unremarkable Adrenals/Urinary Tract: The kidneys, adrenal glands and bladder are unremarkable. Stomach/Bowel: Dilated proximal and mid small bowel loops with collapsed distal small bowel loops are compatible with a high-grade small bowel obstruction. The transition point is likely located within the anterior pelvis which there is a suggestion of anterior abdominal/pelvic wall eventration or hernia. Fluid within the eventration/hernia sac noted. There is no evidence of  pneumoperitoneum.  No abscess is identified. Vascular/Lymphatic: Aortic atherosclerosis. No enlarged abdominal or pelvic lymph nodes. Reproductive: Enlarged uterus with C-section changes noted. Other: No ascites noted. Musculoskeletal: No acute or suspicious bony abnormalities noted. LOWER lumbar and RIGHT SI joint effusion hardware identified. IMPRESSION: 1. High-grade small bowel obstruction with transition point likely within an anterior abdominal/pelvic wall eventration or hernia sac.  No evidence of pneumoperitoneum. 2. Enlarged postpartum uterus with C-section changes. 3. CBD dilatation which could be related to small bowel obstruction. Electronically Signed   By: Margarette Canada M.D.   On: 08/08/2017 23:26   Dg Abd 2 Views  Result Date: 08/07/2017 CLINICAL DATA:  Abdominal bloating. Vomiting. Caesarean section on 07/25/2017. EXAM: ABDOMEN - 2 VIEW COMPARISON:  CT abdomen and pelvis 05/20/2007 FINDINGS: There is no evidence of intraperitoneal free air. There are multiple loops of dilated small bowel in the central and left abdomen measuring up to approximately 6 cm in diameter with some air-fluid levels. Stool and a small amount of gas are present in the colon. Sequelae of prior lumbar and right sacroiliac fusion are noted. Surgical clips are present in the right upper abdomen. The visualized lung bases are clear. IMPRESSION: Moderate small bowel dilatation concerning for obstruction. These results will be called to the ordering clinician or representative by the Radiology Department at the imaging location. Electronically Signed   By: Logan Bores M.D.   On: 08/07/2017 18:12    Pending Labs Unresulted Labs (From admission, onward)   Start     Ordered   08/08/17 2150  Magnesium  STAT,   STAT     08/08/17 2149   08/08/17 2138  Comprehensive metabolic panel  STAT,   STAT     08/08/17 2138   08/08/17 2001  CBC with Differential  STAT,   STAT     08/08/17 2000   08/08/17 2001  Urinalysis, Routine w  reflex microscopic  STAT,   STAT     08/08/17 2000      Vitals/Pain Today's Vitals   08/08/17 1956 08/08/17 2315 08/09/17 0000 08/09/17 0016  BP:  109/74 98/80   Pulse:  87 91   Resp:  10 13   Temp:      TempSrc:      SpO2:  96% 97%   Weight: 123 lb (55.8 kg)     Height: _0  (1.626 m)     PainSc: 0-No pain   0-No pain    Isolation Precautions No active isolations  Medications Medications  potassium chloride 10 mEq in 100 mL IVPB (0 mEq Intravenous Stopped 08/09/17 0014)  ondansetron (ZOFRAN) injection 4 mg (has no administration in time range)  morphine 2 MG/ML injection 2 mg (2 mg Intravenous Given 08/09/17 0109)  0.9 %  sodium chloride infusion ( Intravenous New Bag/Given 08/09/17 0118)  ondansetron (ZOFRAN) injection 4 mg (4 mg Intravenous Given 08/08/17 2046)  lactated ringers bolus 2,000 mL (0 mLs Intravenous Stopped 08/08/17 2300)  gi cocktail (Maalox,Lidocaine,Donnatal) (30 mLs Oral Given 08/08/17 2046)  potassium chloride SA (K-DUR,KLOR-CON) CR tablet 40 mEq (40 mEq Oral Given 08/08/17 2230)  magnesium oxide (MAG-OX) tablet 800 mg (800 mg Oral Given 08/08/17 2210)  iopamidol (ISOVUE-300) 61 % injection 100 mL (100 mLs Intravenous Contrast Given 08/08/17 2305)  lidocaine (XYLOCAINE) 2 % jelly 1 application (1 application Other Given 08/09/17 0116)    Mobility walks

## 2017-08-09 NOTE — ED Notes (Signed)
Dr. Excell Seltzer advises that pt is to have NG tube placed.

## 2017-08-09 NOTE — Progress Notes (Signed)
[name] [id number] 06-Apr-1975  CARE TEAM:  PCP: Kathyrn Lass, MD  Outpatient Care Team: Patient Care Team: Kathyrn Lass, MD as PCP - General (Family Medicine)  Inpatient Treatment Team: Treatment Team: Attending Provider: Hosie Poisson, MD; Consulting Physician: Edison Pace, Md, MD; Rounding Team: Threasa Beards, MD; Technician: Velna Hatchet, [place]; Consulting Physician: Molli Posey, MD   Problem List:   Principal Problem:   Incarcerated incisional hernia with SBO Active Problems:   S/P cesarean section   SBO (small bowel obstruction) (HCC)   Tobacco abuse   GERD (gastroesophageal reflux disease)   Substance abuse ([place])   Hypokalemia   Leukocytosis   Dehiscence of fascia s/p C/S 07/25/2017      * No surgery found [facility] Stay = 1 days  Assessment  SBO with transition point in abdominal wall/Pfannenstiel incision.  Plan:  Needs OR exploration.  Start laparoscopic entry to plan probable laparotomy.  Possible SB resection with increased incisions & only postoperative day #15 from C-section.  Inflammation most likely significant with a lot of fluid.  Perhaps just a stitch caught bowel.  We will see.  Dr. Helane Rima, OB/GYN partner of Dr. Matthew Saras who did the C-section is already here at the bedside and is going to come in the operating room as well to assess.  The anatomy & physiology of the digestive tract was discussed.  The pathophysiology of intestinal obstruction was discussed.  Natural history risks without surgery was discussed.   I feel the patient has failed non-operative therapies.  The risks of no intervention will lead to serious problems such as necrosis, perforation, dehydration, etc. that outweigh the operative risks; therefore, I recommended abdominal exploration to diagnose & treat the source of the problem.  Minimally Invasive & open techniques were discussed.   I expressed a good likelihood that surgery will treat the problem.  Risks such as  bleeding, infection, abscess, leak, reoperation, bowel resection, possible ostomy, hernia, injury to other organs, need for repair of tissues / organs, need for further treatment, heart attack, death, and other risks were discussed.   I noted a good likelihood this will help address the problem.  Goals of post-operative recovery were discussed as well.  We will work to minimize complications. Questions were answered.  The patient expresses understanding & wishes to proceed with surgery.  -hypokalemia Received 103mq of K, 30 since last check of K=3.0 -VTE prophylaxis- SCDs, etc -mobilize as tolerated to help recovery  40 minutes spent in review, evaluation, examination, counseling, and coordination of care.  More than 50% of that time was spent in counseling.  08/09/2017    Subjective: (Chief complaint)  Tired Less abd pain NGT irritating RN at bedside Received 653m of K, 30 since last check of K=3.0  Objective:  Vital signs:  Vitals:   08/09/17 0143 08/09/17 0155 08/09/17 0200 08/09/17 0608  BP: 108/63  116/66 108/63  Pulse: 93  89 82  Resp: '11  16 16  '$ Temp:   98 F (36.7 C) 98.5 F (36.9 C)  TempSrc:   Oral Oral  SpO2: 97%  95% (!) 86%  Weight:  60.4 kg (133 lb 2.5 oz)    Height:  '5\' 4"'$  (1.626 m)      Last BM Date: (pt unsure)  Intake/Output   Yesterday:  07/03 0701 - 07/04 0700 In: 3307.5 [I.V.:587.5; IV Piggyback:2720] Out: 1500 [Emesis/NG output:1500] This shift:  No intake/output data recorded.  Bowel function:  Flatus: No  BM:  No  Drain: Bilious   Physical Exam:  General: Pt awake/alert/oriented x4 in moderate acute distress Eyes: PERRL, normal EOM.  Sclera clear.  No icterus Neuro: CN II-XII intact w/o focal sensory/motor deficits. Lymph: No head/neck/groin lymphadenopathy Psych:  No delerium/psychosis/paranoia HENT: Normocephalic, Mucus membranes moist.  No thrush Neck: Supple, No tracheal deviation Chest: No chest wall pain w good  excursion CV:  Pulses intact.  Regular rhythm MS: Normal AROM mjr joints.  No obvious deformity  Abdomen: Somewhat firm.  Mildy distended.  Tenderness at suprapubic region..   Pfannenstiel incision closed.  Mild swelling in the region but no guarding. no evidence of peritonitis.  No incarcerated hernias.  Ext:   No deformity.  No mjr edema.  No cyanosis Skin: No petechiae / purpura  Results:   Labs: Results for orders placed or performed during the hospital encounter of 08/08/17 (from the past 48 hour(s))  Urinalysis, Routine w reflex microscopic     Status: Abnormal   Collection Time: 08/08/17 11:16 PM  Result Value Ref Range   Color, Urine YELLOW YELLOW   APPearance CLEAR CLEAR   Specific Gravity, Urine >1.046 (H) 1.005 - 1.030   pH 5.0 5.0 - 8.0   Glucose, UA NEGATIVE NEGATIVE mg/dL   Hgb urine dipstick LARGE (A) NEGATIVE   Bilirubin Urine NEGATIVE NEGATIVE   Ketones, ur NEGATIVE NEGATIVE mg/dL   Protein, ur NEGATIVE NEGATIVE mg/dL   Nitrite NEGATIVE NEGATIVE   Leukocytes, UA NEGATIVE NEGATIVE   RBC / HPF >50 (H) 0 - 5 RBC/hpf   WBC, UA 0-5 0 - 5 WBC/hpf   Bacteria, UA NONE SEEN NONE SEEN   Squamous Epithelial / LPF 0-5 0 - 5    Comment: Performed at [facility], [place] [address], [place], [place] [address]  ABO/Rh     Status: None   Collection Time: 08/09/17  2:37 AM  Result Value Ref Range   ABO/RH(D)      B POS Performed at [facility], [place] [address], [place], [place] [address]   Comprehensive metabolic panel     Status: Abnormal   Collection Time: 08/09/17  2:40 AM  Result Value Ref Range   Sodium 132 (L) 135 - 145 mmol/L   Potassium 2.7 (LL) 3.5 - 5.1 mmol/L    Comment: DELTA CHECK NOTED REPEATED TO VERIFY CRITICAL RESULT CALLED TO, READ BACK BY AND VERIFIED WITH: Audry Riles RN [id number] 08/09/17 A NAVARRO    Chloride 81 (L) 98 - 111 mmol/L    Comment: Please note change in reference range.   CO2 37 (H) 22 - 32 mmol/L    Glucose, Bld 107 (H) 70 - 99 mg/dL    Comment: Please note change in reference range.   BUN 16 6 - 20 mg/dL    Comment: Please note change in reference range.   Creatinine, Ser 0.71 0.44 - 1.00 mg/dL   Calcium 8.3 (L) 8.9 - 10.3 mg/dL   Total Protein 6.3 (L) 6.5 - 8.1 g/dL   Albumin 3.0 (L) 3.5 - 5.0 g/dL   AST 18 15 - 41 U/L   ALT 18 0 - 44 U/L    Comment: Please note change in reference range.   Alkaline Phosphatase 92 38 - 126 U/L   Total Bilirubin 0.6 0.3 - 1.2 mg/dL   GFR calc non Af Amer >60 >60 mL/min   GFR calc Af Amer >60 >60 mL/min    Comment: (NOTE) The eGFR has been calculated using  the CKD EPI equation. This calculation has not been validated in all clinical situations. eGFR's persistently <60 mL/min signify possible Chronic Kidney Disease.    Anion gap 14 5 - 15    Comment: Performed at [facility], [place] [address], [place], [place] [address]  Magnesium     Status: None   Collection Time: 08/09/17  2:40 AM  Result Value Ref Range   Magnesium 1.9 1.7 - 2.4 mg/dL    Comment: Performed at [facility], [place] [address] [place], [place] [address]  CBC     Status: Abnormal   Collection Time: 08/09/17  2:40 AM  Result Value Ref Range   WBC 13.7 (H) 4.0 - 10.5 K/uL   RBC 3.88 3.87 - 5.11 MIL/uL   Hemoglobin 11.6 (L) 12.0 - 15.0 g/dL   HCT 34.7 (L) 36.0 - 46.0 %   MCV 89.4 78.0 - 100.0 fL   MCH 29.9 26.0 - 34.0 pg   MCHC 33.4 30.0 - 36.0 g/dL   RDW 12.6 11.5 - 15.5 %   Platelets 308 150 - 400 K/uL    Comment: Performed at [facility], [place] [address], [place], [place] [address]  Culture, blood (Routine X 2) w Reflex to ID Panel     Status: None (Preliminary result)   Collection Time: 08/09/17  2:40 AM  Result Value Ref Range   Specimen Description BLOOD RIGHT ARM    Special Requests      BOTTLES DRAWN AEROBIC AND ANAEROBIC Blood Culture adequate volume Performed at [facility]  [address] [place], [place] [address]    Culture PENDING    Report Status PENDING   Type and screen [place]     Status: None   Collection Time: 08/09/17  2:40 AM  Result Value Ref Range   ABO/RH(D) B POS    Antibody Screen NEG    Sample Expiration      08/12/2017 Performed at [facility], [place] [address], [place], [place] [address]   Procalcitonin     Status: None   Collection Time: 08/09/17  2:40 AM  Result Value Ref Range   Procalcitonin <0.10 ng/mL    Comment:        Interpretation: PCT (Procalcitonin) <= 0.5 ng/mL: Systemic infection (sepsis) is not likely. Local bacterial infection is possible. (NOTE)       Sepsis PCT Algorithm           Lower Respiratory Tract                                      Infection PCT Algorithm    ----------------------------     ----------------------------         PCT < 0.25 ng/mL                PCT < 0.10 ng/mL         Strongly encourage             Strongly discourage   discontinuation of antibiotics    initiation of antibiotics    ----------------------------     -----------------------------       PCT 0.25 - 0.50 ng/mL            PCT 0.10 - 0.25 ng/mL               OR       >  80% decrease in PCT            Discourage initiation of                                            antibiotics      Encourage discontinuation           of antibiotics    ----------------------------     -----------------------------         PCT >= 0.50 ng/mL              PCT 0.26 - 0.50 ng/mL               AND        <80% decrease in PCT             Encourage initiation of                                             antibiotics       Encourage continuation           of antibiotics    ----------------------------     -----------------------------        PCT >= 0.50 ng/mL                  PCT > 0.50 ng/mL               AND         increase in PCT                  Strongly encourage                                       initiation of antibiotics    Strongly encourage escalation           of antibiotics                                     -----------------------------                                           PCT <= 0.25 ng/mL                                                 OR                                        > 80% decrease in PCT                                     Discontinue / Do not initiate  antibiotics Performed at [facility], [place] [address], [place], [place] [address]   Protime-INR     Status: None   Collection Time: 08/09/17  2:40 AM  Result Value Ref Range   Prothrombin Time 12.5 11.4 - 15.2 seconds   INR 0.94     Comment: Performed at [facility], [place] [address], [place], [place] [address]  APTT     Status: None   Collection Time: 08/09/17  2:40 AM  Result Value Ref Range   aPTT 31 24 - 36 seconds    Comment: Performed at [facility], [place] [address] [place], [place] [address]  Lactic acid, plasma     Status: None   Collection Time: 08/09/17  2:40 AM  Result Value Ref Range   Lactic Acid, Venous 0.7 0.5 - 1.9 mmol/L    Comment: Performed at [facility], [place] [address] [place], [place] [address]  Basic metabolic panel     Status: Abnormal   Collection Time: 08/09/17  6:17 AM  Result Value Ref Range   Sodium 133 (L) 135 - 145 mmol/L   Potassium 3.0 (L) 3.5 - 5.1 mmol/L   Chloride 87 (L) 98 - 111 mmol/L    Comment: Please note change in reference range.   CO2 37 (H) 22 - 32 mmol/L   Glucose, Bld 94 70 - 99 mg/dL    Comment: Please note change in reference range.   BUN 14 6 - 20 mg/dL    Comment: Please note change in reference range.   Creatinine, Ser 0.60 0.44 - 1.00 mg/dL   Calcium 8.1 (L) 8.9 - 10.3 mg/dL   GFR calc non Af Amer >60 >60 mL/min   GFR calc Af Amer >60 >60 mL/min    Comment: (NOTE) The eGFR has been calculated using the CKD EPI  equation. This calculation has not been validated in all clinical situations. eGFR's persistently <60 mL/min signify possible Chronic Kidney Disease.    Anion gap 9 5 - 15    Comment: Performed at [facility], [place] [address] [place], [place] [address]  Lactic acid, plasma     Status: None   Collection Time: 08/09/17  6:17 AM  Result Value Ref Range   Lactic Acid, Venous 0.6 0.5 - 1.9 mmol/L    Comment: Performed at [facility], [place] [address] [place], [place] [address]    Imaging / Studies: Dg Abdomen 1 View  Result Date: 08/09/2017 CLINICAL DATA:  NG tube placement EXAM: ABDOMEN - 1 VIEW COMPARISON:  08/07/2017 FINDINGS: Enteric tube tip in the left upper quadrant consistent with location in the upper stomach. Persistent gas distention of left upper quadrant small bowel consistent with small bowel obstruction. Residual contrast material in the renal collecting systems and in the bladder. Postoperative changes in the lower lumbar spine and right SI joint. Surgical clips in the right upper quadrant. IMPRESSION: Enteric tube tip is in the left upper quadrant consistent with location in the upper stomach. Gaseous distention of left upper quadrant small bowel consistent with small bowel obstruction. Electronically Signed   By: Lucienne Capers M.D.   On: 08/09/2017 01:36   Ct Abdomen Pelvis W Contrast  Result Date: 08/08/2017 CLINICAL DATA:  42 year old female with acute abdominal pain and vomiting. C-section on 07/25/2017. EXAM: CT ABDOMEN AND PELVIS WITH CONTRAST TECHNIQUE: Multidetector CT imaging of the abdomen and pelvis was performed using the standard protocol following bolus administration of  intravenous contrast. CONTRAST:  154m ISOVUE-300 IOPAMIDOL (ISOVUE-300) INJECTION 61% COMPARISON:  08/07/2017 radiographs.  05/20/2007 CT FINDINGS: Lower chest: No acute abnormality. Hepatobiliary: The liver is unremarkable. Patient is status post  cholecystectomy. CBD dilatation to the ampulla is noted and measures 12 mm. Pancreas: Unremarkable Spleen: Unremarkable Adrenals/Urinary Tract: The kidneys, adrenal glands and bladder are unremarkable. Stomach/Bowel: Dilated proximal and mid small bowel loops with collapsed distal small bowel loops are compatible with a high-grade small bowel obstruction. The transition point is likely located within the anterior pelvis which there is a suggestion of anterior abdominal/pelvic wall eventration or hernia. Fluid within the eventration/hernia sac noted. There is no evidence of pneumoperitoneum.  No abscess is identified. Vascular/Lymphatic: Aortic atherosclerosis. No enlarged abdominal or pelvic lymph nodes. Reproductive: Enlarged uterus with C-section changes noted. Other: No ascites noted. Musculoskeletal: No acute or suspicious bony abnormalities noted. LOWER lumbar and RIGHT SI joint effusion hardware identified. IMPRESSION: 1. High-grade small bowel obstruction with transition point likely within an anterior abdominal/pelvic wall eventration or hernia sac. No evidence of pneumoperitoneum. 2. Enlarged postpartum uterus with C-section changes. 3. CBD dilatation which could be related to small bowel obstruction. Electronically Signed   By: JMargarette CanadaM.D.   On: 08/08/2017 23:26   Dg Abd 2 Views  Result Date: 08/07/2017 CLINICAL DATA:  Abdominal bloating. Vomiting. Caesarean section on 07/25/2017. EXAM: ABDOMEN - 2 VIEW COMPARISON:  CT abdomen and pelvis 05/20/2007 FINDINGS: There is no evidence of intraperitoneal free air. There are multiple loops of dilated small bowel in the central and left abdomen measuring up to approximately 6 cm in diameter with some air-fluid levels. Stool and a small amount of gas are present in the colon. Sequelae of prior lumbar and right sacroiliac fusion are noted. Surgical clips are present in the right upper abdomen. The visualized lung bases are clear. IMPRESSION: Moderate small  bowel dilatation concerning for obstruction. These results will be called to the ordering clinician or representative by the Radiology Department at the imaging location. Electronically Signed   By: ALogan BoresM.D.   On: 08/07/2017 18:12    Medications / Allergies: per chart  Antibiotics: Anti-infectives (From admission, onward)   Start     Dose/Rate Route Frequency Ordered Stop   08/09/17 0815  clindamycin (CLEOCIN) 900 mg, gentamicin (GARAMYCIN) 240 mg in sodium chloride 0.9 % 1,000 mL for intraperitoneal lavage    Note to Pharmacy:  Have in the  [facility] for final irrigation in bowel surgery case to minimize risk of abscess/infection Pharmacy may adjust dosing strength, schedule, rate of infusion, etc as needed to optimize therapy   1 application Intraperitoneal On call to O.R. 08/09/17 0314907/05/19 0559   08/09/17 0815  cefoTEtan (CEFOTAN) 2 g in sodium chloride 0.9 % 100 mL IVPB     2 g 200 mL/hr over 30 Minutes Intravenous On call to O.R. 08/09/17 0806 08/10/17 0559        Note: Portions of this report may have been transcribed using voice recognition software. Every effort was made to ensure accuracy; however, inadvertent computerized transcription errors may be present.   Any transcriptional errors that result from this process are unintentional.     SAdin Hector MD, FACS, MASCRS Gastrointestinal and Minimally Invasive Surgery    [address] [place] [phone] / Paging ([phone]

## 2017-08-09 NOTE — Anesthesia Procedure Notes (Signed)
Procedure Name: Intubation Date/Time: 08/09/2017 10:22 AM Performed by: Dione Booze, CRNA Pre-anesthesia Checklist: Suction available, Patient being monitored, Emergency Drugs available and Patient identified Patient Re-evaluated:Patient Re-evaluated prior to induction Oxygen Delivery Method: Circle system utilized Preoxygenation: Pre-oxygenation with 100% oxygen Induction Type: IV induction, Rapid sequence and Cricoid Pressure applied Laryngoscope Size: Mac and 4 Grade View: Grade I Tube type: Oral Tube size: 7.5 mm Number of attempts: 1 Airway Equipment and Method: Stylet Placement Confirmation: ETT inserted through vocal cords under direct vision,  positive ETCO2 and breath sounds checked- equal and bilateral Secured at: 21 cm Tube secured with: Tape Dental Injury: Teeth and Oropharynx as per pre-operative assessment

## 2017-08-09 NOTE — Discharge Instructions (Signed)
SURGERY: POST OP INSTRUCTIONS °(Surgery for small bowel obstruction, colon resection, etc) ° ° °###################################################################### ° °EAT °Gradually transition to a high fiber diet with a fiber supplement over the next few days after discharge ° °WALK °Walk an hour a day.  Control your pain to do that.   ° °CONTROL PAIN °Control pain so that you can walk, sleep, tolerate sneezing/coughing, go up/down stairs. ° °HAVE A BOWEL MOVEMENT DAILY °Keep your bowels regular to avoid problems.  OK to try a laxative to override constipation.  OK to use an antidairrheal to slow down diarrhea.  Call if not better after 2 tries ° °CALL IF YOU HAVE PROBLEMS/CONCERNS °Call if you are still struggling despite following these instructions. °Call if you have concerns not answered by these instructions ° °###################################################################### ° ° °DIET °Follow a light diet the first few days at home.  Start with a bland diet such as soups, liquids, starchy foods, low fat foods, etc.  If you feel full, bloated, or constipated, stay on a ful liquid or pureed/blenderized diet for a few days until you feel better and no longer constipated. °Be sure to drink plenty of fluids every day to avoid getting dehydrated (feeling dizzy, not urinating, etc.). °Gradually add a fiber supplement to your diet over the next week.  Gradually get back to a regular solid diet.  Avoid fast food or heavy meals the first week as you are more likely to get nauseated. °It is expected for your digestive tract to need a few months to get back to normal.  It is common for your bowel movements and stools to be irregular.  You will have occasional bloating and cramping that should eventually fade away.  Until you are eating solid food normally, off all pain medications, and back to regular activities; your bowels will not be normal. °Focus on eating a low-fat, high fiber diet the rest of your life  (See Getting to Good Bowel Health, below). ° °CARE of your INCISION or WOUND °It is good for closed incision and even open wounds to be washed every day.  Shower every day.  Short baths are fine.  Wash the incisions and wounds clean with soap & water.    °If you have a closed incision(s), wash the incision with soap & water every day.  You may leave closed incisions open to air if it is dry.   You may cover the incision with clean gauze & replace it after your daily shower for comfort. °If you have skin tapes (Steristrips) or skin glue (Dermabond) on your incision, leave them in place.  They will fall off on their own like a scab.  You may trim any edges that curl up with clean scissors.  If you have staples, set up an appointment for them to be removed in the office in 10 days after surgery.  °If you have a drain, wash around the skin exit site with soap & water and place a new dressing of gauze or band aid around the skin every day.  Keep the drain site clean & dry.    °If you have an open wound with packing, see wound care instructions.  In general, it is encouraged that you remove your dressing and packing, shower with soap & water, and replace your dressing once a day.  Pack the wound with clean gauze moistened with normal (0.9%) saline to keep the wound moist & uninfected.  Pressure on the dressing for 30 minutes will stop most wound   bleeding.  Eventually your body will heal & pull the open wound closed over the next few months.  °Raw open wounds will occasionally bleed or secrete yellow drainage until it heals closed.  Drain sites will drain a little until the drain is removed.  Even closed incisions can have mild bleeding or drainage the first few days until the skin edges scab over & seal.   °If you have an open wound with a wound vac, see wound vac care instructions. ° ° ° ° °ACTIVITIES as tolerated °Start light daily activities --- self-care, walking, climbing stairs-- beginning the day after surgery.   Gradually increase activities as tolerated.  Control your pain to be active.  Stop when you are tired.  Ideally, walk several times a day, eventually an hour a day.   °Most people are back to most day-to-day activities in a few weeks.  It takes 4-8 weeks to get back to unrestricted, intense activity. °If you can walk 30 minutes without difficulty, it is safe to try more intense activity such as jogging, treadmill, bicycling, low-impact aerobics, swimming, etc. °Save the most intensive and strenuous activity for last (Usually 4-8 weeks after surgery) such as sit-ups, heavy lifting, contact sports, etc.  Refrain from any intense heavy lifting or straining until you are off narcotics for pain control.  You will have off days, but things should improve week-by-week. °DO NOT PUSH THROUGH PAIN.  Let pain be your guide: If it hurts to do something, don't do it.  Pain is your body warning you to avoid that activity for another week until the pain goes down. °You may drive when you are no longer taking narcotic prescription pain medication, you can comfortably wear a seatbelt, and you can safely make sudden turns/stops to protect yourself without hesitating due to pain. °You may have sexual intercourse when it is comfortable. If it hurts to do something, stop. ° °MEDICATIONS °Take your usually prescribed home medications unless otherwise directed.   °Blood thinners:  °Usually you can restart any strong blood thinners after the second postoperative day.  It is OK to take aspirin right away.    ° If you are on strong blood thinners (warfarin/Coumadin, Plavix, Xerelto, Eliquis, Pradaxa, etc), discuss with your surgeon, medicine PCP, and/or cardiologist for instructions on when to restart the blood thinner & if blood monitoring is needed (PT/INR blood check, etc).   ° ° °PAIN CONTROL °Pain after surgery or related to activity is often due to strain/injury to muscle, tendon, nerves and/or incisions.  This pain is usually  short-term and will improve in a few months.  °To help speed the process of healing and to get back to regular activity more quickly, DO THE FOLLOWING THINGS TOGETHER: °1. Increase activity gradually.  DO NOT PUSH THROUGH PAIN °2. Use Ice and/or Heat °3. Try Gentle Massage and/or Stretching °4. Take over the counter pain medication °5. Take Narcotic prescription pain medication for more severe pain ° °Good pain control = faster recovery.  It is better to take more medicine to be more active than to stay in bed all day to avoid medications. °1.  Increase activity gradually °Avoid heavy lifting at first, then increase to lifting as tolerated over the next 6 weeks. °Do not “push through” the pain.  Listen to your body and avoid positions and maneuvers than reproduce the pain.  Wait a few days before trying something more intense °Walking an hour a day is encouraged to help your body recover faster   and more safely.  Start slowly and stop when getting sore.  If you can walk 30 minutes without stopping or pain, you can try more intense activity (running, jogging, aerobics, cycling, swimming, treadmill, sex, sports, weightlifting, etc.) °Remember: If it hurts to do it, then don’t do it! °2. Use Ice and/or Heat °You will have swelling and bruising around the incisions.  This will take several weeks to resolve. °Ice packs or heating pads (6-8 times a day, 30-60 minutes at a time) will help sooth soreness & bruising. °Some people prefer to use ice alone, heat alone, or alternate between ice & heat.  Experiment and see what works best for you.  Consider trying ice for the first few days to help decrease swelling and bruising; then, switch to heat to help relax sore spots and speed recovery. °Shower every day.  Short baths are fine.  It feels good!  Keep the incisions and wounds clean with soap & water.   °3. Try Gentle Massage and/or Stretching °Massage at the area of pain many times a day °Stop if you feel pain - do not  overdo it °4. Take over the counter pain medication °This helps the muscle and nerve tissues become less irritable and calm down faster °Choose ONE of the following over-the-counter anti-inflammatory medications: °Acetaminophen 500mg tabs (Tylenol) 1-2 pills with every meal and just before bedtime (avoid if you have liver problems or if you have acetaminophen in you narcotic prescription) °Naproxen 220mg tabs (ex. Aleve, Naprosyn) 1-2 pills twice a day (avoid if you have kidney, stomach, IBD, or bleeding problems) °Ibuprofen 200mg tabs (ex. Advil, Motrin) 3-4 pills with every meal and just before bedtime (avoid if you have kidney, stomach, IBD, or bleeding problems) °Take with food/snack several times a day as directed for at least 2 weeks to help keep pain / soreness down & more manageable. °5. Take Narcotic prescription pain medication for more severe pain °A prescription for strong pain control is often given to you upon discharge (for example: oxycodone/Percocet, hydrocodone/Norco/Vicodin, or tramadol/Ultram) °Take your pain medication as prescribed. °Be mindful that most narcotic prescriptions contain Tylenol (acetaminophen) as well - avoid taking too much Tylenol. °If you are having problems/concerns with the prescription medicine (does not control pain, nausea, vomiting, rash, itching, etc.), please call us [phone] to see if we need to switch you to a different pain medicine that will work better for you and/or control your side effects better. °If you need a refill on your pain medication, you must call the office before 4 pm and on weekdays only.  By federal law, prescriptions for narcotics cannot be called into a pharmacy.  They must be filled out on paper & picked up from our office by the patient or authorized caretaker.  Prescriptions cannot be filled after 4 pm nor on weekends.   ° °WHEN TO CALL US [phone] °Severe uncontrolled or worsening pain  °Fever over 101 F (38.5 C) °Concerns with  the incision: Worsening pain, redness, rash/hives, swelling, bleeding, or drainage °Reactions / problems with new medications (itching, rash, hives, nausea, etc.) °Nausea and/or vomiting °Difficulty urinating °Difficulty breathing °Worsening fatigue, dizziness, lightheadedness, blurred vision °Other concerns °If you are not getting better after two weeks or are noticing you are getting worse, contact our office [phone] for further advice.  We may need to adjust your medications, re-evaluate you in the office, send you to the emergency room, or see what other things we can do to help. °The   clinic staff is available to answer your questions during regular business hours (8:30am-5pm).  Please don’t hesitate to call and ask to speak to one of our nurses for clinical concerns.    °A surgeon from [facility] is always on call at the hospitals 24 hours/day °If you have a medical emergency, go to the nearest emergency room or call 911. ° °FOLLOW UP in our office °One the day of your discharge from the hospital (or the next business weekday), please call [facility] to set up or confirm an appointment to see your surgeon in the office for a follow-up appointment.  Usually it is 2-3 weeks after your surgery.   °If you have skin staples at your incision(s), let the office know so we can set up a time in the office for the nurse to remove them (usually around 10 days after surgery). °Make sure that you call for appointments the day of discharge (or the next business weekday) from the hospital to ensure a convenient appointment time. °IF YOU HAVE DISABILITY OR FAMILY LEAVE FORMS, BRING THEM TO THE OFFICE FOR PROCESSING.  DO NOT GIVE THEM TO YOUR DOCTOR. ° °[facility] °[address], [address], [place], [place]  [address] ? °[phone] - Main °[phone] - Toll Free,  [phone] - Fax °[email or web address] ° °GETTING TO GOOD BOWEL HEALTH. °It is  expected for your digestive tract to need a few months to get back to normal.  It is common for your bowel movements and stools to be irregular.  You will have occasional bloating and cramping that should eventually fade away.  Until you are eating solid food normally, off all pain medications, and back to regular activities; your bowels will not be normal.   °Avoiding constipation °The goal: ONE SOFT BOWEL MOVEMENT A DAY!    °Drink plenty of fluids.  Choose water first. °TAKE A FIBER SUPPLEMENT EVERY DAY THE REST OF YOUR LIFE °During your first week back home, gradually add back a fiber supplement every day °Experiment which form you can tolerate.   There are many forms such as powders, tablets, wafers, gummies, etc °Psyllium bran (Metamucil), methylcellulose (Citrucel), Miralax or Glycolax, Benefiber, Flax Seed.  °Adjust the dose week-by-week (1/2 dose/day to 6 doses a day) until you are moving your bowels 1-2 times a day.  Cut back the dose or try a different fiber product if it is giving you problems such as diarrhea or bloating. °Sometimes a laxative is needed to help jump-start bowels if constipated until the fiber supplement can help regulate your bowels.  If you are tolerating eating & you are farting, it is okay to try a gentle laxative such as double dose MiraLax, prune juice, or Milk of Magnesia.  Avoid using laxatives too often. °Stool softeners can sometimes help counteract the constipating effects of narcotic pain medicines.  It can also cause diarrhea, so avoid using for too long. °If you are still constipated despite taking fiber daily, eating solids, and a few doses of laxatives, call our office. °Controlling diarrhea °Try drinking liquids and eating bland foods for a few days to avoid stressing your intestines further. °Avoid dairy products (especially milk & ice cream) for a short time.  The intestines often can lose the ability to digest lactose when stressed. °Avoid foods that cause gassiness or  bloating.  Typical foods include beans and other legumes, cabbage, broccoli, and dairy foods.  Avoid greasy, spicy, fast foods.  Every person has   some sensitivity to other foods, so listen to your body and avoid those foods that trigger problems for you. °Probiotics (such as active yogurt, Align, etc) may help repopulate the intestines and colon with normal bacteria and calm down a sensitive digestive tract °Adding a fiber supplement gradually can help thicken stools by absorbing excess fluid and retrain the intestines to act more normally.  Slowly increase the dose over a few weeks.  Too much fiber too soon can backfire and cause cramping & bloating. °It is okay to try and slow down diarrhea with a few doses of antidiarrheal medicines.   °Bismuth subsalicylate (ex. Kayopectate, Pepto Bismol) for a few doses can help control diarrhea.  Avoid if pregnant.   °Loperamide (Imodium) can slow down diarrhea.  Start with one tablet (2mg) first.  Avoid if you are having fevers or severe pain.  °ILEOSTOMY PATIENTS WILL HAVE CHRONIC DIARRHEA since their colon is not in use.    °Drink plenty of liquids.  You will need to drink even more glasses of water/liquid a day to avoid getting dehydrated. °Record output from your ileostomy.  Expect to empty the bag every 3-4 hours at first.  Most people with a permanent ileostomy empty their bag 4-6 times at the least.   °Use antidiarrheal medicine (especially Imodium) several times a day to avoid getting dehydrated.  Start with a dose at bedtime & breakfast.  Adjust up or down as needed.  Increase antidiarrheal medications as directed to avoid emptying the bag more than 8 times a day (every 3 hours). °Work with your wound ostomy nurse to learn care for your ostomy.  See ostomy care instructions. °TROUBLESHOOTING IRREGULAR BOWELS °1) Start with a soft & bland diet. No spicy, greasy, or fried foods.  °2) Avoid gluten/wheat or dairy products from diet to see if symptoms improve. °3) Miralax  17gm or flax seed mixed in 8oz. water or juice-daily. May use 2-4 times a day as needed. °4) Gas-X, Phazyme, etc. as needed for gas & bloating.  °5) Prilosec (omeprazole) over-the-counter as needed °6)  Consider probiotics (Align, Activa, etc) to help calm the bowels down ° °Call your doctor if you are getting worse or not getting better.  Sometimes further testing (cultures, endoscopy, X-ray studies, CT scans, bloodwork, etc.) may be needed to help diagnose and treat the cause of the diarrhea. °[facility] °[address], [address], [place], [place]  [address] °[phone] - Main.    °[phone]  - Toll Free.   [phone] - Fax °[email or web address] ° ° °

## 2017-08-10 ENCOUNTER — Encounter (HOSPITAL_COMMUNITY): Payer: Self-pay | Admitting: Surgery

## 2017-08-10 DIAGNOSIS — T8130XD Disruption of wound, unspecified, subsequent encounter: Secondary | ICD-10-CM

## 2017-08-10 LAB — BASIC METABOLIC PANEL
ANION GAP: 6 (ref 5–15)
BUN: 12 mg/dL (ref 6–20)
CALCIUM: 7.9 mg/dL — AB (ref 8.9–10.3)
CO2: 33 mmol/L — ABNORMAL HIGH (ref 22–32)
Chloride: 94 mmol/L — ABNORMAL LOW (ref 98–111)
Creatinine, Ser: 0.67 mg/dL (ref 0.44–1.00)
GFR calc Af Amer: >60 mL/min (ref >60)
GFR calc non Af Amer: >60 mL/min (ref >60)
GLUCOSE: 132 mg/dL — AB (ref 70–99)
Potassium: 3.6 mmol/L (ref 3.5–5.1)
Sodium: 133 mmol/L — ABNORMAL LOW (ref 135–145)

## 2017-08-10 LAB — CBC WITH DIFFERENTIAL/PLATELET
Basophils Absolute: 0 10*3/uL (ref 0.0–0.1)
Basophils Relative: 0 %
Eosinophils Absolute: 0.1 10*3/uL (ref 0.0–0.7)
Eosinophils Relative: 1 %
HEMATOCRIT: 33.8 % — AB (ref 36.0–46.0)
HEMOGLOBIN: 11.3 g/dL — AB (ref 12.0–15.0)
LYMPHS PCT: 11 %
Lymphs Abs: 1.4 10*3/uL (ref 0.7–4.0)
MCH: 29.9 pg (ref 26.0–34.0)
MCHC: 33.4 g/dL (ref 30.0–36.0)
MCV: 89.4 fL (ref 78.0–100.0)
MONOS PCT: 7 %
Monocytes Absolute: 0.8 10*3/uL (ref 0.1–1.0)
NEUTROS ABS: 10.3 10*3/uL — AB (ref 1.7–7.7)
NEUTROS PCT: 81 %
Platelets: 340 10*3/uL (ref 150–400)
RBC: 3.78 MIL/uL — AB (ref 3.87–5.11)
RDW: 12.7 % (ref 11.5–15.5)
WBC: 12.6 10*3/uL — AB (ref 4.0–10.5)

## 2017-08-10 LAB — URINE CULTURE: Culture: NO GROWTH

## 2017-08-10 LAB — MAGNESIUM: Magnesium: 2.1 mg/dL (ref 1.7–2.4)

## 2017-08-10 MED ORDER — METHOCARBAMOL 1000 MG/10ML IJ SOLN
1000.0000 mg | Freq: Three times a day (TID) | INTRAVENOUS | Status: DC
  Administered 2017-08-10 – 2017-08-14 (×10): 1000 mg via INTRAVENOUS
  Filled 2017-08-10 (×16): qty 10

## 2017-08-10 MED ORDER — POTASSIUM CHLORIDE 10 MEQ/100ML IV SOLN
10.0000 meq | INTRAVENOUS | Status: AC
  Administered 2017-08-10 (×2): 10 meq via INTRAVENOUS
  Filled 2017-08-10 (×3): qty 100

## 2017-08-10 MED ORDER — ACETAMINOPHEN 10 MG/ML IV SOLN
1000.0000 mg | Freq: Four times a day (QID) | INTRAVENOUS | Status: AC
  Administered 2017-08-10 (×2): 1000 mg via INTRAVENOUS
  Filled 2017-08-10 (×4): qty 100

## 2017-08-10 MED ORDER — HYDROMORPHONE HCL 1 MG/ML IJ SOLN
0.5000 mg | INTRAMUSCULAR | Status: DC | PRN
  Administered 2017-08-10: 1.5 mg via INTRAVENOUS
  Administered 2017-08-10: 1 mg via INTRAVENOUS
  Administered 2017-08-10 – 2017-08-11 (×4): 1.5 mg via INTRAVENOUS
  Administered 2017-08-11: 1 mg via INTRAVENOUS
  Administered 2017-08-11: 1.5 mg via INTRAVENOUS
  Administered 2017-08-11: 1 mg via INTRAVENOUS
  Administered 2017-08-11 – 2017-08-12 (×2): 1.5 mg via INTRAVENOUS
  Administered 2017-08-12 (×2): 1 mg via INTRAVENOUS
  Administered 2017-08-12: 0.5 mg via INTRAVENOUS
  Administered 2017-08-12: 1 mg via INTRAVENOUS
  Administered 2017-08-12: 0.5 mg via INTRAVENOUS
  Administered 2017-08-12 – 2017-08-14 (×10): 1 mg via INTRAVENOUS
  Filled 2017-08-10: qty 1
  Filled 2017-08-10 (×2): qty 1.5
  Filled 2017-08-10 (×3): qty 1
  Filled 2017-08-10: qty 1.5
  Filled 2017-08-10: qty 1
  Filled 2017-08-10 (×3): qty 1.5
  Filled 2017-08-10 (×4): qty 1
  Filled 2017-08-10: qty 1.5
  Filled 2017-08-10: qty 1
  Filled 2017-08-10 (×2): qty 1.5
  Filled 2017-08-10 (×2): qty 1
  Filled 2017-08-10: qty 1.5
  Filled 2017-08-10: qty 0.5
  Filled 2017-08-10 (×3): qty 1
  Filled 2017-08-10: qty 0.5
  Filled 2017-08-10: qty 1

## 2017-08-10 MED ORDER — ENOXAPARIN SODIUM 40 MG/0.4ML ~~LOC~~ SOLN
40.0000 mg | SUBCUTANEOUS | Status: DC
  Administered 2017-08-11: 40 mg via SUBCUTANEOUS
  Filled 2017-08-10 (×3): qty 0.4

## 2017-08-10 NOTE — Progress Notes (Signed)
Patient refused to have Foley removed in the am and the afternoon. Encouraged patient to ambulate and have Foley discontinued. She refused to let me take out Foley and refused to get out of bed. Notified Attending this am and Surgery Provider this afternoon. In addition, she refused a few of afternoon medications. See MAR. Will continue to monitor patient.

## 2017-08-10 NOTE — Progress Notes (Signed)
PROGRESS NOTE    [name]  ERX:[id number] DOB: October 24, 1975 DOA: 08/08/2017 PCP: Kathyrn Lass, MD    Brief Narrative: [name] is a 42 y.o. female with medical history significant of depression, GERD, tobacco abuse, substance abuse, s/p of C section on 07/25/17 (not doing breast-feeding currently) who presents with nausea, vomiting, abdominal pain. She was found to have SBO, with possible incarcerated hernia. She was also found to have severe hypokalemia and admitted to medical service, and surgery consulted.  Ob/GYN consulted by surgeon and she underwent laparoscopy with lysis of adhesions, small bowel resection   on 08/09/2017.     Assessment & Plan:   Principal Problem:   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 08/09/2017 Active Problems:   S/P cesarean section   SBO (small bowel obstruction) (HCC)   Tobacco abuse   GERD (gastroesophageal reflux disease)   Substance abuse (HCC)   Hypokalemia   Leukocytosis   Dehiscence of fascia s/p C/S 07/25/2017   SBO, incarcerated Hernia with ischemic bowel ; S/p lap with lysis of adhesions and bowel resection. By Dr Johney Maine on 08/09/2017.  Pain control with IV dilaudid.  IV fluids for hydration.  Incision site clean. Recommend PT eval to get the patient out of bed and ambulate.  Foley discontinued by SURGERY, but patient wants to keep it one more day   Hypokalemia: replaced. Keep K>4, Mag >2.    Hyponatremia: mild probably from dehydration.    Tobacco abuse and substance abuse: On nicotine patch.    S/p recent C section:  Ob gyn on board.    Anemia: Mild probably from acute anemia of blood loss. Transfuse to keep hemoglobin greater than 7. Currently hemoglobin is stable around 11.     DVT prophylaxis: scd's/ Lovenox.  Code Status: full code.  Family Communication: none at bedside.  Disposition Plan: pending clinical improvement. PT evaluation.   Consultants:   Surgery.   OB/gyn  Procedures:   Lap, lysis of adhesions and bowel resection.   Antimicrobials:    Subjective: Requesting pain medication for abdominal pain.  Refusing the foley to be discontinued.  Does not want to get up from bed today.   Objective: Vitals:   08/09/17 2221 08/10/17 0207 08/10/17 0604 08/10/17 1320  BP: 113/78 118/81 113/77 115/76  Pulse: 94 88 90 86  Resp: 18 16 16 17   Temp: 99.1 F (37.3 C) 99.5 F (37.5 C) 99.1 F (37.3 C) 98.6 F (37 C)  TempSrc: Oral Oral Oral Oral  SpO2: 98% 99% 97% 96%  Weight:      Height:        Intake/Output Summary (Last 24 hours) at 08/10/2017 1622 Last data filed at 08/10/2017 1400 Gross per 24 hour  Intake 3059.33 ml  Output 10 ml  Net 3049.33 ml   Filed Weights   08/08/17 1956 08/09/17 0155  Weight: 55.8 kg (123 lb) 60.4 kg (133 lb 2.5 oz)    Examination:  General exam: NG tube , not in distress.  Respiratory system: Clear to auscultation. Respiratory effort normal. No wheezing or rhonchi.  Cardiovascular system: S1 & S2 heard, RRR. No JVD,  No pedal edema. Gastrointestinal system: Abdomen is distended, tender, incision site clean with drain in place. Central nervous system: sleepy, able to answer all questions on asking. , non focal.  Extremities: Symmetric 5 x 5 power. Skin: No rashes, lesions or ulcers Psychiatry:Mood & affect appropriate.     Data Reviewed: I have personally reviewed following labs  and imaging studies  CBC: Recent Labs  Lab 08/08/17 2044 08/09/17 0240 08/10/17 0440  WBC 16.3* 13.7* 12.6*  NEUTROABS 12.2*  --  10.3*  HGB 13.2 11.6* 11.3*  HCT 38.7 34.7* 33.8*  MCV 88.2 89.4 89.4  PLT 352 308 939   Basic Metabolic Panel: Recent Labs  Lab 08/08/17 2044 08/09/17 0240 08/09/17 0617 08/10/17 0440  NA 133* 132* 133* 133*  K 2.2* 2.7* 3.0* 3.6  CL 78* 81* 87* 94*  CO2 31 37* 37* 33*  GLUCOSE 122* 107* 94 132*  BUN 22* 16 14 12   CREATININE 0.71 0.71 0.60 0.67  CALCIUM 8.9 8.3* 8.1* 7.9*  MG  --  1.9 2.4 2.1    GFR: Estimated Creatinine Clearance: 79.1 mL/min (by C-G formula based on SCr of 0.67 mg/dL). Liver Function Tests: Recent Labs  Lab 08/08/17 2044 08/09/17 0240  AST 16 18  ALT 22 18  ALKPHOS 104 92  BILITOT 0.4 0.6  PROT 7.4 6.3*  ALBUMIN 3.6 3.0*   Recent Labs  Lab 08/08/17 2044  LIPASE 36   No results for input(s): AMMONIA in the last 168 hours. Coagulation Profile: Recent Labs  Lab 08/09/17 0240  INR 0.94   Cardiac Enzymes: No results for input(s): CKTOTAL, CKMB, CKMBINDEX, TROPONINI in the last 168 hours. BNP (last 3 results) No results for input(s): PROBNP in the last 8760 hours. HbA1C: No results for input(s): HGBA1C in the last 72 hours. CBG: No results for input(s): GLUCAP in the last 168 hours. Lipid Profile: No results for input(s): CHOL, HDL, LDLCALC, TRIG, CHOLHDL, LDLDIRECT in the last 72 hours. Thyroid Function Tests: No results for input(s): TSH, T4TOTAL, FREET4, T3FREE, THYROIDAB in the last 72 hours. Anemia Panel: No results for input(s): VITAMINB12, FOLATE, FERRITIN, TIBC, IRON, RETICCTPCT in the last 72 hours. Sepsis Labs: Recent Labs  Lab 08/09/17 0240 08/09/17 0617  PROCALCITON <0.10  --   LATICACIDVEN 0.7 0.6    Recent Results (from the past 240 hour(s))  Culture, Urine     Status: Abnormal   Collection Time: 08/01/17  8:37 PM  Result Value Ref Range Status   Specimen Description   Final    URINE, CLEAN CATCH Performed at [facility], [address], [place], [place] [address]    Special Requests   Final    NONE Performed at [facility], [address] [place], [place] [address]    Culture (A)  Final    <10,000 COLONIES/mL INSIGNIFICANT GROWTH Performed at [facility], [place] [address] [place], [place] [address]    Report Status 08/03/2017 FINAL  Final  Urine Culture     Status: None   Collection Time: 08/09/17  1:29 AM  Result Value Ref Range Status   Specimen Description   Final    URINE, CLEAN  CATCH Performed at [facility], [place] [address] [place], [place] [address]    Special Requests   Final    NONE Performed at [facility], [place] [address] [place], [place] [address]    Culture   Final    NO GROWTH Performed at [facility], [place] [address], [place],  [address]    Report Status 08/10/2017 FINAL  Final  Culture, blood (Routine X 2) w Reflex to ID Panel     Status: None (Preliminary result)   Collection Time: 08/09/17  2:40 AM  Result Value Ref Range Status   Specimen Description BLOOD RIGHT ARM  Final  Special Requests   Final    BOTTLES DRAWN AEROBIC AND ANAEROBIC Blood Culture adequate volume Performed at [facility] [address], [place], [place] [address]    Culture   Final    NO GROWTH 1 DAY Performed at [facility], [place] [address], [place], [place] [address]    Report Status PENDING  Incomplete  Culture, blood (Routine X 2) w Reflex to ID Panel     Status: None (Preliminary result)   Collection Time: 08/09/17  2:40 AM  Result Value Ref Range Status   Specimen Description   Final    BLOOD LEFT HAND Performed at [facility] [address], [place], [place] [address]    Special Requests   Final    BOTTLES DRAWN AEROBIC AND ANAEROBIC Blood Culture adequate volume Performed at [facility] [address], [place], [place] [address]    Culture   Final    NO GROWTH 1 DAY Performed at [facility], [place] [address], [place], [place] [address]    Report Status PENDING  Incomplete  MRSA PCR Screening     Status: None   Collection Time: 08/09/17  9:50 AM  Result Value Ref Range Status   MRSA by PCR NEGATIVE NEGATIVE Final    Comment:        The GeneXpert MRSA Assay (FDA approved for NASAL specimens only), is one component of a comprehensive MRSA colonization surveillance program. It is not intended to diagnose MRSA infection nor to  guide or monitor treatment for MRSA infections. Performed at [facility], [place] [address], [place],  [address]          Radiology Studies: Dg Abdomen 1 View  Result Date: 08/09/2017 CLINICAL DATA:  NG tube placement EXAM: ABDOMEN - 1 VIEW COMPARISON:  08/07/2017 FINDINGS: Enteric tube tip in the left upper quadrant consistent with location in the upper stomach. Persistent gas distention of left upper quadrant small bowel consistent with small bowel obstruction. Residual contrast material in the renal collecting systems and in the bladder. Postoperative changes in the lower lumbar spine and right SI joint. Surgical clips in the right upper quadrant. IMPRESSION: Enteric tube tip is in the left upper quadrant consistent with location in the upper stomach. Gaseous distention of left upper quadrant small bowel consistent with small bowel obstruction. Electronically Signed   By: Lucienne Capers M.D.   On: 08/09/2017 01:36   Ct Abdomen Pelvis W Contrast  Result Date: 08/08/2017 CLINICAL DATA:  42 year old female with acute abdominal pain and vomiting. C-section on 07/25/2017. EXAM: CT ABDOMEN AND PELVIS WITH CONTRAST TECHNIQUE: Multidetector CT imaging of the abdomen and pelvis was performed using the standard protocol following bolus administration of intravenous contrast. CONTRAST:  142mL ISOVUE-300 IOPAMIDOL (ISOVUE-300) INJECTION 61% COMPARISON:  08/07/2017 radiographs.  05/20/2007 CT FINDINGS: Lower chest: No acute abnormality. Hepatobiliary: The liver is unremarkable. Patient is status post cholecystectomy. CBD dilatation to the ampulla is noted and measures 12 mm. Pancreas: Unremarkable Spleen: Unremarkable Adrenals/Urinary Tract: The kidneys, adrenal glands and bladder are unremarkable. Stomach/Bowel: Dilated proximal and mid small bowel loops with collapsed distal small bowel loops are compatible with a high-grade small bowel obstruction. The transition point is likely  located within the anterior pelvis which there is a suggestion of anterior abdominal/pelvic wall eventration or hernia. Fluid within the eventration/hernia sac noted. There is no evidence of pneumoperitoneum.  No abscess is identified. Vascular/Lymphatic: Aortic atherosclerosis. No enlarged abdominal  or pelvic lymph nodes. Reproductive: Enlarged uterus with C-section changes noted. Other: No ascites noted. Musculoskeletal: No acute or suspicious bony abnormalities noted. LOWER lumbar and RIGHT SI joint effusion hardware identified. IMPRESSION: 1. High-grade small bowel obstruction with transition point likely within an anterior abdominal/pelvic wall eventration or hernia sac. No evidence of pneumoperitoneum. 2. Enlarged postpartum uterus with C-section changes. 3. CBD dilatation which could be related to small bowel obstruction. Electronically Signed   By: Margarette Canada M.D.   On: 08/08/2017 23:26        Scheduled Meds: . buprenorphine-naloxone  1 tablet Sublingual Daily  . enoxaparin (LOVENOX) injection  40 mg Subcutaneous Q24H  . lip balm  1 application Topical BID  . nicotine  21 mg Transdermal Daily  . scopolamine  1 patch Transdermal Q72H   Continuous Infusions: . acetaminophen Stopped (08/10/17 1057)  . dextrose 5 % and 0.45 % NaCl with KCl 40 mEq/L 100 mL/hr at 08/10/17 1538  . famotidine (PEPCID) IV Stopped (08/10/17 1038)  . lactated ringers    . methocarbamol (ROBAXIN)  IV Stopped (08/10/17 1038)  . potassium chloride       LOS: 2 days    Time spent: 35 minutes    Hosie Poisson, MD [facility] Pager [phone]  If 7PM-7AM, please contact night-coverage [email or web address] Password [email or web address] 08/10/2017, 4:22 PM

## 2017-08-10 NOTE — Progress Notes (Addendum)
1 Day Post-Op    CC: Abdominal pain with small bowel obstruction  Subjective: Patient lying in bed, moaning sound does not want to move.  No bowel sounds.  She has an appointment today with her chronic substance use clinic; she is on Suboxone for this. Minimal NG drainage, minimal drainage from the JP and this is serosanguineous.  No bowel sounds.  Objective: Vital signs in last 24 hours: Temp:  [97.6 F (36.4 C)-99.5 F (37.5 C)] 99.1 F (37.3 C) (07/05 0604) Pulse Rate:  [78-94] 90 (07/05 0604) Resp:  [10-26] 16 (07/05 0604) BP: (110-124)/(72-81) 113/77 (07/05 0604) SpO2:  [93 %-100 %] 97 % (07/05 0604) Last BM Date: (pt unsure) 4850 IV 475 urine 600 NG Drain 20 T-max 99.5, vital signs are stable.  She still on nasal cannula 2 L. Potassium is up to 3.6./Magnesium 2.1 WBC 11.3/HCT 33.8  -stable  CT scan on admission 08/08/2017:High-grade small bowel obstruction with transition point likely within an anterior abdominal/pelvic wall eventration or hernia sac. No evidence of pneumoperitoneum.  Enlarged postpartum uterus with C-section changes.  CBD dilatation which could be related to small bowel obstruction. No evidence of pneumoperitoneum.  Enlarged postpartum uterus with C-section changes. CBD dilatation which could be related to small bowel obstruction.  Abdominal x-ray, single view, 7/4:Enteric tube tip is in the left upper quadrant consistent with location in the upper stomach. Gaseous distention of left upper quadrant small bowel consistent with small bowel obstruction.  Intake/Output from previous day: 07/04 0701 - 07/05 0700 In: 4854.7 [I.V.:2778.7; NG/GT:450; IV Piggyback:1150] Out: 1610 [Urine:475; Emesis/NG output:600; Drains:20; Blood:150] Intake/Output this shift: No intake/output data recorded.  General appearance: alert, cooperative and no distress Resp: clear to auscultation bilaterally GI: Soft, extremely tender, no flatus, no bowel sounds, no BM.  Some  drainage on the waffle dressing that is bloody but will leave in place for now  Lab Results:  Recent Labs    08/09/17 0240 08/10/17 0440  WBC 13.7* 12.6*  HGB 11.6* 11.3*  HCT 34.7* 33.8*  PLT 308 340    BMET Recent Labs    08/09/17 0617 08/10/17 0440  NA 133* 133*  K 3.0* 3.6  CL 87* 94*  CO2 37* 33*  GLUCOSE 94 132*  BUN 14 12  CREATININE 0.60 0.67  CALCIUM 8.1* 7.9*   PT/INR Recent Labs    08/09/17 0240  LABPROT 12.5  INR 0.94    Recent Labs  Lab 08/08/17 2044 08/09/17 0240  AST 16 18  ALT 22 18  ALKPHOS 104 92  BILITOT 0.4 0.6  PROT 7.4 6.3*  ALBUMIN 3.6 3.0*     Lipase     Component Value Date/Time   LIPASE 36 08/08/2017 2044     Medications: . buprenorphine-naloxone  1 tablet Sublingual Daily  . lip balm  1 application Topical BID  . nicotine  21 mg Transdermal Daily  . scopolamine  1 patch Transdermal Q72H   . acetaminophen    . dextrose 5 % and 0.45 % NaCl with KCl 40 mEq/L 100 mL/hr at 08/10/17 0339  . famotidine (PEPCID) IV Stopped (08/09/17 2352)  . lactated ringers    . methocarbamol (ROBAXIN)  IV      Assessment/Plan   INCARCERATED INCISIONAL HERNIA WITH ISCHEMIC BOWEL s/p C section 07/25/17 - Dr. Park Pope Diagnostic laparoscopy, lysis of adhesions x60 minutes, small bowel resection, fascial closure, bilateral tap block;  08/09/2017, Dr. Michael Boston  Hx depression Hx GERD  Hx tobacco abuse Hx substance abuse -  on Suboxone Hypokalemia -improved  3.6 Pain control -currently: IV Tylenol/Robaxin/Dilaudid  FEN:  IV fluids/NPO - ice chips ID:  Cefotetan pre op DVT:  Add Lovenox later today Follow up:  Dr. Jaynee Eagles. Matthew Saras Foley:  DC today  Plan: Remove her Foley, get her out of bed and start moving her around some.  I have decreased the Dilaudid some.  Continue the IV Tylenol for now, continue IV Robaxin every 8.  Continue NG suction for now.  Potassium is 3.6 I would continue to replace and try and get her up around 4.0.   LOS: 2 days    Haydin Dunn 08/10/2017 [phone]

## 2017-08-10 NOTE — Progress Notes (Signed)
Initial Nutrition Assessment  DOCUMENTATION CODES:   Not applicable  INTERVENTION:    Monitor for diet advancement/toleration  Boost Breeze po BID, each supplement provides 250 kcal and 9 grams of protein once diet is advanced.   NUTRITION DIAGNOSIS:   Increased nutrient needs related to post-op healing as evidenced by estimated needs.  GOAL:   Patient will meet greater than or equal to 90% of their needs  MONITOR:   PO intake, Diet advancement, Weight trends, Supplement acceptance, Labs, I & O's  REASON FOR ASSESSMENT:   Malnutrition Screening Tool    ASSESSMENT:   Patient with PMH significant for recent C-section 07/25/17, eating disorders, depression, and substance abuse. Presents this admission with complaints of nausea/vomiting. CT scan revealed incarcerated hernia with  SBO.    7/4- lysis of adhesions, small resection, fascial closure  Pt currently has NGT to suction. RD observed 100 ml of dark fluid in canister at bedside. Pt unable to provide history as she was in severe pain. H&P states after pt returned home 6/26 she had persistent nausea and vomiting that worsened upon eating or drinking. Suspect intake has been poor since that time period. She remains NPO s/p procedure. Will provide supplements once diet is advanced.   Pt unable to provide UBW. Records are limited in weight history but show a recent wt loss s/p C-section which is to be expected. Will try to obtain more history if possible. Nutrition-Focused physical exam completed.   Medications reviewed and include: suboxone, D5 with 40 mEq KCl @ 100 ml/hr Labs reviewed: Na 133 (L)   NUTRITION - FOCUSED PHYSICAL EXAM:    Most Recent Value  Orbital Region  No depletion  Upper Arm Region  No depletion  Thoracic and Lumbar Region  Unable to assess  Buccal Region  Mild depletion  Temple Region  Moderate depletion  Clavicle Bone Region  Moderate depletion  Clavicle and Acromion Bone Region  No depletion   Scapular Bone Region  Unable to assess  Dorsal Hand  No depletion  Patellar Region  No depletion  Anterior Thigh Region  No depletion  Posterior Calf Region  No depletion  Edema (RD Assessment)  None     Diet Order:   Diet Order           Diet NPO time specified Except for: [employer], Sips with Meds  Diet effective now          EDUCATION NEEDS:   Not appropriate for education at this time  Skin:  Skin Assessment: Skin Integrity Issues: Skin Integrity Issues:: Other (Comment) Other: closed abdomen  Last BM:  PTA  Height:   Ht Readings from Last 1 Encounters:  08/09/17 5\' 4"  (1.626 m)    Weight:   Wt Readings from Last 1 Encounters:  08/09/17 133 lb 2.5 oz (60.4 kg)    Ideal Body Weight:  54.5 kg  BMI:  Body mass index is 22.86 kg/m.  Estimated Nutritional Needs:   Kcal:  1600-1800 kcal  Protein:  80-90 grams   Fluid:  >1.6 L.day    Mariana Single RD, LDN Clinical Nutrition Pager # [phone]

## 2017-08-10 NOTE — Progress Notes (Signed)
Patient is sleeping.  Says she is not nauseated but still having abdominal pain.  NPO BP 113/77 (BP Location: Left Arm)   Pulse 90   Temp 99.1 F (37.3 C) (Oral)   Resp 16   Ht 5\' 4"  (1.626 m)   Wt 60.4 kg (133 lb 2.5 oz)   LMP  (LMP Unknown)   SpO2 97%   Breastfeeding? No   BMI 22.86 kg/m  Results for orders placed or performed during the hospital encounter of 08/08/17 (from the past 24 hour(s))  MRSA PCR Screening     Status: None   Collection Time: 08/09/17  9:50 AM  Result Value Ref Range   MRSA by PCR NEGATIVE NEGATIVE  Basic metabolic panel     Status: Abnormal   Collection Time: 08/10/17  4:40 AM  Result Value Ref Range   Sodium 133 (L) 135 - 145 mmol/L   Potassium 3.6 3.5 - 5.1 mmol/L   Chloride 94 (L) 98 - 111 mmol/L   CO2 33 (H) 22 - 32 mmol/L   Glucose, Bld 132 (H) 70 - 99 mg/dL   BUN 12 6 - 20 mg/dL   Creatinine, Ser 0.67 0.44 - 1.00 mg/dL   Calcium 7.9 (L) 8.9 - 10.3 mg/dL   GFR calc non Af Amer >60 >60 mL/min   GFR calc Af Amer >60 >60 mL/min   Anion gap 6 5 - 15  Magnesium     Status: None   Collection Time: 08/10/17  4:40 AM  Result Value Ref Range   Magnesium 2.1 1.7 - 2.4 mg/dL  CBC with Differential/Platelet     Status: Abnormal   Collection Time: 08/10/17  4:40 AM  Result Value Ref Range   WBC 12.6 (H) 4.0 - 10.5 K/uL   RBC 3.78 (L) 3.87 - 5.11 MIL/uL   Hemoglobin 11.3 (L) 12.0 - 15.0 g/dL   HCT 33.8 (L) 36.0 - 46.0 %   MCV 89.4 78.0 - 100.0 fL   MCH 29.9 26.0 - 34.0 pg   MCHC 33.4 30.0 - 36.0 g/dL   RDW 12.7 11.5 - 15.5 %   Platelets 340 150 - 400 K/uL   Neutrophils Relative % 81 %   Neutro Abs 10.3 (H) 1.7 - 7.7 K/uL   Lymphocytes Relative 11 %   Lymphs Abs 1.4 0.7 - 4.0 K/uL   Monocytes Relative 7 %   Monocytes Absolute 0.8 0.1 - 1.0 K/uL   Eosinophils Relative 1 %   Eosinophils Absolute 0.1 0.0 - 0.7 K/uL   Basophils Relative 0 %   Basophils Absolute 0.0 0.0 - 0.1 K/uL   NG tube drainage decreased JP Tube drainage minimal  now Abdomen distended Incision is clean and dry Patient tender with palpation  POD # 1  Incarcerated hernia with SBO and status post laparoscopy, laparotomy and Small bowel resection  Labs improved Keep NPO Appreciate General surgery care Will continue to follow with them

## 2017-08-11 LAB — BASIC METABOLIC PANEL
ANION GAP: 7 (ref 5–15)
BUN: 8 mg/dL (ref 6–20)
CHLORIDE: 102 mmol/L (ref 98–111)
CO2: 26 mmol/L (ref 22–32)
CREATININE: 0.57 mg/dL (ref 0.44–1.00)
Calcium: 8 mg/dL — ABNORMAL LOW (ref 8.9–10.3)
GFR calc non Af Amer: >60 mL/min (ref >60)
Glucose, Bld: 102 mg/dL — ABNORMAL HIGH (ref 70–99)
Potassium: 4.5 mmol/L (ref 3.5–5.1)
SODIUM: 135 mmol/L (ref 135–145)

## 2017-08-11 LAB — CBC WITH DIFFERENTIAL/PLATELET
BASOS ABS: 0 10*3/uL (ref 0.0–0.1)
Basophils Relative: 0 %
Eosinophils Absolute: 0.4 10*3/uL (ref 0.0–0.7)
Eosinophils Relative: 3 %
HEMATOCRIT: 32.9 % — AB (ref 36.0–46.0)
Hemoglobin: 11 g/dL — ABNORMAL LOW (ref 12.0–15.0)
LYMPHS ABS: 1.7 10*3/uL (ref 0.7–4.0)
Lymphocytes Relative: 13 %
MCH: 30.3 pg (ref 26.0–34.0)
MCHC: 33.4 g/dL (ref 30.0–36.0)
MCV: 90.6 fL (ref 78.0–100.0)
MONOS PCT: 7 %
Monocytes Absolute: 0.9 10*3/uL (ref 0.1–1.0)
NEUTROS PCT: 77 %
Neutro Abs: 10 10*3/uL — ABNORMAL HIGH (ref 1.7–7.7)
Platelets: 352 10*3/uL (ref 150–400)
RBC: 3.63 MIL/uL — ABNORMAL LOW (ref 3.87–5.11)
RDW: 12.9 % (ref 11.5–15.5)
WBC: 13 10*3/uL — AB (ref 4.0–10.5)

## 2017-08-11 LAB — MAGNESIUM: MAGNESIUM: 1.9 mg/dL (ref 1.7–2.4)

## 2017-08-11 MED ORDER — GABAPENTIN 300 MG PO CAPS
300.0000 mg | ORAL_CAPSULE | Freq: Three times a day (TID) | ORAL | Status: DC
  Filled 2017-08-11 (×3): qty 1

## 2017-08-11 MED ORDER — IBUPROFEN 800 MG PO TABS
800.0000 mg | ORAL_TABLET | Freq: Three times a day (TID) | ORAL | Status: DC
  Filled 2017-08-11 (×3): qty 1

## 2017-08-11 MED ORDER — ACETAMINOPHEN 10 MG/ML IV SOLN
1000.0000 mg | Freq: Four times a day (QID) | INTRAVENOUS | Status: AC
  Filled 2017-08-11 (×4): qty 100

## 2017-08-11 NOTE — Progress Notes (Signed)
PT Cancellation Note  Patient Details Name: [name] MRN: [id number] DOB: 1975-07-27   Cancelled Treatment:    Reason Eval/Treat Not Completed: Patient declined, no reason specified Pt refuses to participate until NG tube is removed.  RN aware and will page if/when pt agreeable to participate.   Nima Bamburg,KATHrine E 08/11/2017, 11:20 AM Carmelia Bake, PT, DPT 08/11/2017 Pager: [phone]

## 2017-08-11 NOTE — Progress Notes (Signed)
Patient really wants NG tube out - it is bothering her throat a lot. She feels rumbling but no Flatus yet.  BP 110/74 (BP Location: Right Arm)   Pulse 92   Temp 98.5 F (36.9 C) (Oral)   Resp (!) 22   Ht 5\' 4"  (1.626 m)   Wt 60.4 kg (133 lb 2.5 oz)   LMP  (LMP Unknown)   SpO2 98%   Breastfeeding? No   BMI 22.86 kg/m  Results for orders placed or performed during the hospital encounter of 08/08/17 (from the past 24 hour(s))  Basic metabolic panel     Status: Abnormal   Collection Time: 08/11/17  3:59 AM  Result Value Ref Range   Sodium 135 135 - 145 mmol/L   Potassium 4.5 3.5 - 5.1 mmol/L   Chloride 102 98 - 111 mmol/L   CO2 26 22 - 32 mmol/L   Glucose, Bld 102 (H) 70 - 99 mg/dL   BUN 8 6 - 20 mg/dL   Creatinine, Ser 0.57 0.44 - 1.00 mg/dL   Calcium 8.0 (L) 8.9 - 10.3 mg/dL   GFR calc non Af Amer >60 >60 mL/min   GFR calc Af Amer >60 >60 mL/min   Anion gap 7 5 - 15  Magnesium     Status: None   Collection Time: 08/11/17  3:59 AM  Result Value Ref Range   Magnesium 1.9 1.7 - 2.4 mg/dL  CBC with Differential/Platelet     Status: Abnormal   Collection Time: 08/11/17  3:59 AM  Result Value Ref Range   WBC 13.0 (H) 4.0 - 10.5 K/uL   RBC 3.63 (L) 3.87 - 5.11 MIL/uL   Hemoglobin 11.0 (L) 12.0 - 15.0 g/dL   HCT 32.9 (L) 36.0 - 46.0 %   MCV 90.6 78.0 - 100.0 fL   MCH 30.3 26.0 - 34.0 pg   MCHC 33.4 30.0 - 36.0 g/dL   RDW 12.9 11.5 - 15.5 %   Platelets 352 150 - 400 K/uL   Neutrophils Relative % 77 %   Lymphocytes Relative 13 %   Monocytes Relative 7 %   Eosinophils Relative 3 %   Basophils Relative 0 %   Neutro Abs 10.0 (H) 1.7 - 7.7 K/uL   Lymphs Abs 1.7 0.7 - 4.0 K/uL   Monocytes Absolute 0.9 0.1 - 1.0 K/uL   Eosinophils Absolute 0.4 0.0 - 0.7 K/uL   Basophils Absolute 0.0 0.0 - 0.1 K/uL   Smear Review MORPHOLOGY UNREMARKABLE    Abdomen is soft and non tender Slightly distended Drainage from JP is minimal  POD # 2 doing better Encouraged ambulation and hopefully  NG can be removed soon Appreciate General surgery care

## 2017-08-11 NOTE — Progress Notes (Signed)
PROGRESS NOTE    [name]  XAJ:[id number] DOB: 1975-05-15 DOA: 08/08/2017 PCP: Kathyrn Lass, MD    Brief Narrative: [name] is a 42 y.o. female with medical history significant of depression, GERD, tobacco abuse, substance abuse, s/p of C section on 07/25/17 (not doing breast-feeding currently) who presents with nausea, vomiting, abdominal pain. She was found to have SBO, with possible incarcerated hernia. She was also found to have severe hypokalemia and admitted to medical service, and surgery consulted.  Ob/GYN consulted by surgeon and she underwent laparoscopy with lysis of adhesions, small bowel resection   on 08/09/2017.     Assessment & Plan:   Principal Problem:   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 08/09/2017 Active Problems:   S/P cesarean section   SBO (small bowel obstruction) (HCC)   Tobacco abuse   GERD (gastroesophageal reflux disease)   Substance abuse (HCC)   Hypokalemia   Leukocytosis   Dehiscence of fascia s/p C/S 07/25/2017   SBO, incarcerated Hernia with ischemic bowel ; S/p lap with lysis of adhesions and bowel resection. By Dr Johney Maine on 08/09/2017.  Pain control with IV dilaudid.  IV fluids for hydration.  Incision site clean. Recommend PT eval to get the patient out of bed and ambulate.  Foley discontinued by SURGERY, but patient wants to keep it one more day and today she is adamant about keeping the foley one more day, despite telling her about getting prone to UTI'S.    Hypokalemia: replaced. Keep K>4, Mag >2.    Hyponatremia: mild probably from dehydration. Resolved.    Tobacco abuse and substance abuse: On suboxone sub lingual.  On nicotine patch.    S/p recent C section:  Ob gyn on board.    Anemia: Mild probably from acute anemia of blood loss. Transfuse to keep hemoglobin greater than 7. Currently hemoglobin is stable around 11.     DVT prophylaxis: scd's/ Lovenox.  Code Status: full code.  Family  Communication: none at bedside.  Disposition Plan: pending clinical improvement. PT evaluation.   Consultants:   Surgery.   OB/gyn  Procedures:  Lap, lysis of adhesions and bowel resection.   Antimicrobials:    Subjective: WANTS the NG tube to be out.  No nausea,  abd pain is the same.   Objective: Vitals:   08/10/17 1320 08/10/17 2054 08/11/17 0601 08/11/17 1303  BP: 115/76 110/70 106/74 110/74  Pulse: 86 99 86 92  Resp: 17 18 18  (!) 22  Temp: 98.6 F (37 C) 98.8 F (37.1 C) 98.5 F (36.9 C)   TempSrc: Oral Oral Oral   SpO2: 96% 97% 99% 98%  Weight:      Height:        Intake/Output Summary (Last 24 hours) at 08/11/2017 1314 Last data filed at 08/11/2017 0400 Gross per 24 hour  Intake 1950 ml  Output 1820 ml  Net 130 ml   Filed Weights   08/08/17 1956 08/09/17 0155  Weight: 55.8 kg (123 lb) 60.4 kg (133 lb 2.5 oz)    Examination:  General exam: NG tube present. No distress.  Respiratory system: air entry fair , no wheezing or rhonchi.  Cardiovascular system: S1 & S2 heard, RRR. No JVD,  No pedal edema. Gastrointestinal system: Abdomen is distended, tender, incision site clean with drain in place. No bowel sounds . Central nervous system:alert and oriented. Non focal. Extremities: no pedal edema. . Skin: No rashes, lesions or ulcers Psychiatry:Mood & affect appropriate.  Data Reviewed: I have personally reviewed following labs and imaging studies  CBC: Recent Labs  Lab 08/08/17 2044 08/09/17 0240 08/10/17 0440 08/11/17 0359  WBC 16.3* 13.7* 12.6* 13.0*  NEUTROABS 12.2*  --  10.3* 10.0*  HGB 13.2 11.6* 11.3* 11.0*  HCT 38.7 34.7* 33.8* 32.9*  MCV 88.2 89.4 89.4 90.6  PLT 352 308 340 161   Basic Metabolic Panel: Recent Labs  Lab 08/08/17 2044 08/09/17 0240 08/09/17 0617 08/10/17 0440 08/11/17 0359  NA 133* 132* 133* 133* 135  K 2.2* 2.7* 3.0* 3.6 4.5  CL 78* 81* 87* 94* 102  CO2 31 37* 37* 33* 26  GLUCOSE 122* 107* 94 132* 102*    BUN 22* 16 14 12 8   CREATININE 0.71 0.71 0.60 0.67 0.57  CALCIUM 8.9 8.3* 8.1* 7.9* 8.0*  MG  --  1.9 2.4 2.1 1.9   GFR: Estimated Creatinine Clearance: 79.1 mL/min (by C-G formula based on SCr of 0.57 mg/dL). Liver Function Tests: Recent Labs  Lab 08/08/17 2044 08/09/17 0240  AST 16 18  ALT 22 18  ALKPHOS 104 92  BILITOT 0.4 0.6  PROT 7.4 6.3*  ALBUMIN 3.6 3.0*   Recent Labs  Lab 08/08/17 2044  LIPASE 36   No results for input(s): AMMONIA in the last 168 hours. Coagulation Profile: Recent Labs  Lab 08/09/17 0240  INR 0.94   Cardiac Enzymes: No results for input(s): CKTOTAL, CKMB, CKMBINDEX, TROPONINI in the last 168 hours. BNP (last 3 results) No results for input(s): PROBNP in the last 8760 hours. HbA1C: No results for input(s): HGBA1C in the last 72 hours. CBG: No results for input(s): GLUCAP in the last 168 hours. Lipid Profile: No results for input(s): CHOL, HDL, LDLCALC, TRIG, CHOLHDL, LDLDIRECT in the last 72 hours. Thyroid Function Tests: No results for input(s): TSH, T4TOTAL, FREET4, T3FREE, THYROIDAB in the last 72 hours. Anemia Panel: No results for input(s): VITAMINB12, FOLATE, FERRITIN, TIBC, IRON, RETICCTPCT in the last 72 hours. Sepsis Labs: Recent Labs  Lab 08/09/17 0240 08/09/17 0617  PROCALCITON <0.10  --   LATICACIDVEN 0.7 0.6    Recent Results (from the past 240 hour(s))  Culture, Urine     Status: Abnormal   Collection Time: 08/01/17  8:37 PM  Result Value Ref Range Status   Specimen Description   Final    URINE, CLEAN CATCH Performed at [facility], [address] [place], [place] [address]    Special Requests   Final    NONE Performed at [facility], [address], [place], [place] [address]    Culture (A)  Final    <10,000 COLONIES/mL INSIGNIFICANT GROWTH Performed at [facility], [place] [address] [place], [place] [address]    Report Status 08/03/2017 FINAL  Final  Urine Culture     Status: None    Collection Time: 08/09/17  1:29 AM  Result Value Ref Range Status   Specimen Description   Final    URINE, CLEAN CATCH Performed at [facility], [place] [address], [place], [place] [address]    Special Requests   Final    NONE Performed at [facility], [place] [address], [place], [place] [address]    Culture   Final    NO GROWTH Performed at [facility], [place] [address], [place], [place] [address]    Report Status 08/10/2017 FINAL  Final  Culture, blood (Routine X 2) w Reflex to ID Panel     Status:  None (Preliminary result)   Collection Time: 08/09/17  2:40 AM  Result Value Ref Range Status   Specimen Description BLOOD RIGHT ARM  Final   Special Requests   Final    BOTTLES DRAWN AEROBIC AND ANAEROBIC Blood Culture adequate volume Performed at [place] [address], [place], [place] [address]    Culture   Final    NO GROWTH 2 DAYS Performed at [facility] [address], [place], [place] [address]    Report Status PENDING  Incomplete  Culture, blood (Routine X 2) w Reflex to ID Panel     Status: None (Preliminary result)   Collection Time: 08/09/17  2:40 AM  Result Value Ref Range Status   Specimen Description   Final    BLOOD LEFT HAND Performed at [place] [address], [place], [place] [address]    Special Requests   Final    BOTTLES DRAWN AEROBIC AND ANAEROBIC Blood Culture adequate volume Performed at [facility] [address] [place], [place] [address]    Culture   Final    NO GROWTH 2 DAYS Performed at [facility] [address], [place], [place] [address]    Report Status PENDING  Incomplete  MRSA PCR Screening     Status: None   Collection Time: 08/09/17  9:50 AM  Result Value Ref Range Status   MRSA by PCR NEGATIVE NEGATIVE Final    Comment:        The GeneXpert MRSA Assay (FDA approved for NASAL specimens only), is one  component of a comprehensive MRSA colonization surveillance program. It is not intended to diagnose MRSA infection nor to guide or monitor treatment for MRSA infections. Performed at [facility], [place] [address], [place], [place] [address]          Radiology Studies: No results found.      Scheduled Meds: . buprenorphine-naloxone  1 tablet Sublingual Daily  . enoxaparin (LOVENOX) injection  40 mg Subcutaneous Q24H  . gabapentin  300 mg Oral TID  . ibuprofen  800 mg Oral TID  . lip balm  1 application Topical BID  . nicotine  21 mg Transdermal Daily  . scopolamine  1 patch Transdermal Q72H   Continuous Infusions: . acetaminophen    . dextrose 5 % and 0.45 % NaCl with KCl 40 mEq/L 100 mL/hr at 08/11/17 1258  . famotidine (PEPCID) IV Stopped (08/11/17 1020)  . lactated ringers    . methocarbamol (ROBAXIN)  IV Stopped (08/11/17 1110)     LOS: 3 days    Time spent: 35 minutes    Hosie Poisson, MD [facility] Pager [phone]  If 7PM-7AM, please contact night-coverage [email or web address] Password [email or web address] 08/11/2017, 1:14 PM

## 2017-08-11 NOTE — Progress Notes (Addendum)
Patient will not ambulate or allow RN to discontinue catheter. Pt states she "will not move until the NG tube is discontinued." Educated patient on POC and risk of infection with prolonged catheter use and complications related to not ambulating.  [name]. Brigitte Pulse, RN

## 2017-08-11 NOTE — Progress Notes (Signed)
2 Days Post-Op    CC: Abdominal pain with small bowel obstruction  Subjective: No acute events. Refused to have foley removed. Complains of pain when she tries to pass flatus, has not had any flatus or bm yet  Objective: Vital signs in last 24 hours: Temp:  [98.5 F (36.9 C)-98.8 F (37.1 C)] 98.5 F (36.9 C) (07/06 0601) Pulse Rate:  [86-99] 86 (07/06 0601) Resp:  [17-18] 18 (07/06 0601) BP: (106-115)/(70-76) 106/74 (07/06 0601) SpO2:  [96 %-99 %] 99 % (07/06 0601) Last BM Date: (pt unsure)   CT scan on admission 08/08/2017:High-grade small bowel obstruction with transition point likely within an anterior abdominal/pelvic wall eventration or hernia sac. No evidence of pneumoperitoneum.  Enlarged postpartum uterus with C-section changes.  CBD dilatation which could be related to small bowel obstruction. No evidence of pneumoperitoneum.  Enlarged postpartum uterus with C-section changes. CBD dilatation which could be related to small bowel obstruction.  Abdominal x-ray, single view, 7/4:Enteric tube tip is in the left upper quadrant consistent with location in the upper stomach. Gaseous distention of left upper quadrant small bowel consistent with small bowel obstruction.  Intake/Output from previous day: 07/05 0701 - 07/06 0700 In: 2571.7 [I.V.:2421.7; IV Piggyback:150] Out: 1324 [Urine:1800; Drains:30] Intake/Output this shift: No intake/output data recorded.  General appearance: alert, cooperative and no distress Resp: clear to auscultation bilaterally GI: Soft, mildly distended, diffusely tender. Incisions c/d/i, old bloody drainage on either side of honeycomb dressing. JP output serosanguinous.   Lab Results:  Recent Labs    08/10/17 0440 08/11/17 0359  WBC 12.6* 13.0*  HGB 11.3* 11.0*  HCT 33.8* 32.9*  PLT 340 352    BMET Recent Labs    08/10/17 0440 08/11/17 0359  NA 133* 135  K 3.6 4.5  CL 94* 102  CO2 33* 26  GLUCOSE 132* 102*  BUN 12 8  CREATININE  0.67 0.57  CALCIUM 7.9* 8.0*   PT/INR Recent Labs    08/09/17 0240  LABPROT 12.5  INR 0.94    Recent Labs  Lab 08/08/17 2044 08/09/17 0240  AST 16 18  ALT 22 18  ALKPHOS 104 92  BILITOT 0.4 0.6  PROT 7.4 6.3*  ALBUMIN 3.6 3.0*     Lipase     Component Value Date/Time   LIPASE 36 08/08/2017 2044     Medications: . buprenorphine-naloxone  1 tablet Sublingual Daily  . enoxaparin (LOVENOX) injection  40 mg Subcutaneous Q24H  . lip balm  1 application Topical BID  . nicotine  21 mg Transdermal Daily  . scopolamine  1 patch Transdermal Q72H   . acetaminophen Stopped (08/11/17 0110)  . dextrose 5 % and 0.45 % NaCl with KCl 40 mEq/L 100 mL/hr at 08/11/17 0146  . famotidine (PEPCID) IV Stopped (08/10/17 2232)  . lactated ringers    . methocarbamol (ROBAXIN)  IV Stopped (08/11/17 0215)    Assessment/Plan   INCARCERATED INCISIONAL HERNIA WITH ISCHEMIC BOWEL s/p C section 07/25/17 - Dr. Park Pope Diagnostic laparoscopy, lysis of adhesions x60 minutes, small bowel resection, fascial closure, bilateral tap block;  08/09/2017, Dr. Michael Boston  Hx depression Hx GERD  Hx tobacco abuse Hx substance abuse - on Suboxone Hypokalemia -improved  3.6 Pain control -currently: IV Tylenol/Robaxin/Dilaudid  FEN:  IV fluids/NPO - ice chips ID:  Cefotetan pre op DVT:  lovenox Follow up:  Dr. Jaynee Eagles. Matthew Saras Foley:  DC today  Plan: Continue supportive care, await return of bowel function. NG output is minimal. Will try clamping  today. Recommend removal of foley ASAP to avoid CAUTI.    LOS: 3 days    Clovis Riley 08/11/2017 [phone]

## 2017-08-12 LAB — CBC WITH DIFFERENTIAL/PLATELET
BASOS ABS: 0 10*3/uL (ref 0.0–0.1)
Basophils Relative: 0 %
EOS ABS: 0.3 10*3/uL (ref 0.0–0.7)
Eosinophils Relative: 2 %
HEMATOCRIT: 32.5 % — AB (ref 36.0–46.0)
Hemoglobin: 10.7 g/dL — ABNORMAL LOW (ref 12.0–15.0)
LYMPHS ABS: 1.7 10*3/uL (ref 0.7–4.0)
Lymphocytes Relative: 11 %
MCH: 29.7 pg (ref 26.0–34.0)
MCHC: 32.9 g/dL (ref 30.0–36.0)
MCV: 90.3 fL (ref 78.0–100.0)
MONOS PCT: 7 %
Monocytes Absolute: 1.1 10*3/uL — ABNORMAL HIGH (ref 0.1–1.0)
NEUTROS ABS: 12.4 10*3/uL — AB (ref 1.7–7.7)
Neutrophils Relative %: 80 %
Platelets: 345 10*3/uL (ref 150–400)
RBC: 3.6 MIL/uL — ABNORMAL LOW (ref 3.87–5.11)
RDW: 12.9 % (ref 11.5–15.5)
WBC: 15.5 10*3/uL — ABNORMAL HIGH (ref 4.0–10.5)

## 2017-08-12 LAB — BASIC METABOLIC PANEL
Anion gap: 6 (ref 5–15)
BUN: 8 mg/dL (ref 6–20)
CO2: 21 mmol/L — ABNORMAL LOW (ref 22–32)
CREATININE: 0.5 mg/dL (ref 0.44–1.00)
Calcium: 7.6 mg/dL — ABNORMAL LOW (ref 8.9–10.3)
Chloride: 105 mmol/L (ref 98–111)
GFR calc Af Amer: >60 mL/min (ref >60)
Glucose, Bld: 96 mg/dL (ref 70–99)
Potassium: 4.9 mmol/L (ref 3.5–5.1)
SODIUM: 132 mmol/L — AB (ref 135–145)

## 2017-08-12 LAB — MAGNESIUM: MAGNESIUM: 1.8 mg/dL (ref 1.7–2.4)

## 2017-08-12 MED ORDER — ACETAMINOPHEN 10 MG/ML IV SOLN
1000.0000 mg | Freq: Four times a day (QID) | INTRAVENOUS | Status: AC
  Administered 2017-08-13 (×2): 1000 mg via INTRAVENOUS
  Filled 2017-08-12 (×4): qty 100

## 2017-08-12 NOTE — Evaluation (Signed)
Physical Therapy Evaluation Patient Details Name: [name] MRN: [id number] DOB: 12-14-1975 Today's Date: 08/12/2017   History of Present Illness  42 y.o. female with medical history significant of depression, GERD, tobacco abuse, substance abuse, chronic back pain, lumbar fusion, s/p of C section on 07/25/17 (not doing breast-feeding currently) who presents with nausea, vomiting, abdominal pain. She was found to have SBO, with possible incarcerated hernia and underwent laparoscopy with lysis of adhesions, small bowel resection on 08/09/2017  Clinical Impression  Pt admitted with above diagnosis. Pt currently with functional limitations due to the deficits listed below (see PT Problem List).  Pt will benefit from skilled PT to increase their independence and safety with mobility to allow discharge to the venue listed below.  Pt assisted with ambulating short distance however mostly limited by increased pain despite premedication.  Anticipate pt's mobility to improve with improvement in pain.     Follow Up Recommendations Supervision for mobility/OOB;No PT follow up    Equipment Recommendations  Rolling walker with 5" wheels(if still requiring support)    Recommendations for Other Services       Precautions / Restrictions Precautions Precaution Comments: L JP drain      Mobility  Bed Mobility Overal bed mobility: Needs Assistance Bed Mobility: Supine to Sit;Sit to Supine     Supine to sit: Min assist;HOB elevated Sit to supine: HOB elevated;Min guard   General bed mobility comments: pt preferred her own method of bed mobility, especially leaving HOB raised due to back pain, increased time and effort  Transfers Overall transfer level: Needs assistance Equipment used: 2 person hand held assist Transfers: Sit to/from Stand Sit to Stand: Min assist         General transfer comment: initial rise with 2 HHA per pt request, assist to rise due to pain, min/guard thereafter and  pt dependent on UEs for self assist (also using RW)  Ambulation/Gait Ambulation/Gait assistance: Min guard Gait Distance (Feet): 45 Feet Assistive device: Rolling walker (2 wheeled) Gait Pattern/deviations: Step-to pattern;Trunk flexed     General Gait Details: pt with very slow pace due to increased pain, initially ambulated with HHA however agreeable to use RW for better support, multiple standing rest breaks due to pain, increased time, ambulated just into hallway then bathroom then back to bed  Stairs            Wheelchair Mobility    Modified Rankin (Stroke Patients Only)       Balance                                             Pertinent Vitals/Pain Pain Assessment: 0-10 Pain Score: 10-Worst pain ever Pain Location: abdomen "on fire" Pain Descriptors / Indicators: Burning;Sharp Pain Intervention(s): Limited activity within patient's tolerance;Repositioned;Monitored during session;Premedicated before session    Home Living Family/patient expects to be discharged to:: Private residence Living Arrangements: Children Available Help at Discharge: Family Type of Home: House       Home Layout: One level Home Equipment: None      Prior Function Level of Independence: Independent               Hand Dominance        Extremity/Trunk Assessment        Lower Extremity Assessment Lower Extremity Assessment: Overall WFL for tasks assessed       Communication  Communication: No difficulties  Cognition Arousal/Alertness: Awake/alert Behavior During Therapy: Flat affect Overall Cognitive Status: Within Functional Limits for tasks assessed                                        General Comments      Exercises     Assessment/Plan    PT Assessment Patient needs continued PT services  PT Problem List Decreased mobility;Decreased activity tolerance;Decreased knowledge of use of DME       PT Treatment  Interventions DME instruction;Therapeutic activities;Gait training;Therapeutic exercise;Patient/family education;Functional mobility training    PT Goals (Current goals can be found in the Care Plan section)  Acute Rehab PT Goals PT Goal Formulation: With patient Time For Goal Achievement: 08/26/17 Potential to Achieve Goals: Good    Frequency Min 3X/week   Barriers to discharge        Co-evaluation               AM-PAC PT "6 Clicks" Daily Activity  Outcome Measure Difficulty turning over in bed (including adjusting bedclothes, sheets and blankets)?: A Lot Difficulty moving from lying on back to sitting on the side of the bed? : A Lot Difficulty sitting down on and standing up from a chair with arms (e.g., wheelchair, bedside commode, etc,.)?: Unable Help needed moving to and from a bed to chair (including a wheelchair)?: A Little Help needed walking in hospital room?: A Lot Help needed climbing 3-5 steps with a railing? : Total 6 Click Score: 11    End of Session   Activity Tolerance: Patient limited by pain Patient left: in bed;with call bell/phone within reach Nurse Communication: Mobility status PT Visit Diagnosis: Difficulty in walking, not elsewhere classified (R26.2)    Time: 8757-9728 PT Time Calculation (min) (ACUTE ONLY): 55 min   Charges:   PT Evaluation $PT Eval Low Complexity: 1 Low PT Treatments $Gait Training: 23-37 mins   PT G CodesCarmelia Bake, PT, DPT 08/12/2017 Pager: [phone]  York Ram E 08/12/2017, 1:10 PM

## 2017-08-12 NOTE — Progress Notes (Signed)
Encouraged patient to allow nurse to remove abdominal dressings and clean site per order. She states, "I'm hurting. Can we wait?" Pain meds given 20 min prior to attempt at dressing removal but patient states, "They haven't kicked in yet." Patient to call RN when she is ready for dressing removal.

## 2017-08-12 NOTE — Progress Notes (Signed)
Patient doing a little better.  Reports some flatus.  BP 96/62 (BP Location: Right Arm)   Pulse 89   Temp 98.1 F (36.7 C) (Oral)   Resp 20   Ht 5\' 4"  (1.626 m)   Wt 60.4 kg (133 lb 2.5 oz)   LMP  (LMP Unknown)   SpO2 96%   Breastfeeding? No   BMI 22.86 kg/m  Results for orders placed or performed during the hospital encounter of 08/08/17 (from the past 24 hour(s))  Basic metabolic panel     Status: Abnormal   Collection Time: 08/12/17  3:22 AM  Result Value Ref Range   Sodium 132 (L) 135 - 145 mmol/L   Potassium 4.9 3.5 - 5.1 mmol/L   Chloride 105 98 - 111 mmol/L   CO2 21 (L) 22 - 32 mmol/L   Glucose, Bld 96 70 - 99 mg/dL   BUN 8 6 - 20 mg/dL   Creatinine, Ser 0.50 0.44 - 1.00 mg/dL   Calcium 7.6 (L) 8.9 - 10.3 mg/dL   GFR calc non Af Amer >60 >60 mL/min   GFR calc Af Amer >60 >60 mL/min   Anion gap 6 5 - 15  Magnesium     Status: None   Collection Time: 08/12/17  3:22 AM  Result Value Ref Range   Magnesium 1.8 1.7 - 2.4 mg/dL  CBC with Differential/Platelet     Status: Abnormal   Collection Time: 08/12/17  3:22 AM  Result Value Ref Range   WBC 15.5 (H) 4.0 - 10.5 K/uL   RBC 3.60 (L) 3.87 - 5.11 MIL/uL   Hemoglobin 10.7 (L) 12.0 - 15.0 g/dL   HCT 32.5 (L) 36.0 - 46.0 %   MCV 90.3 78.0 - 100.0 fL   MCH 29.7 26.0 - 34.0 pg   MCHC 32.9 30.0 - 36.0 g/dL   RDW 12.9 11.5 - 15.5 %   Platelets 345 150 - 400 K/uL   Neutrophils Relative % 80 %   Lymphocytes Relative 11 %   Monocytes Relative 7 %   Eosinophils Relative 2 %   Basophils Relative 0 %   Neutro Abs 12.4 (H) 1.7 - 7.7 K/uL   Lymphs Abs 1.7 0.7 - 4.0 K/uL   Monocytes Absolute 1.1 (H) 0.1 - 1.0 K/uL   Eosinophils Absolute 0.3 0.0 - 0.7 K/uL   Basophils Absolute 0.0 0.0 - 0.1 K/uL   Smear Review MORPHOLOGY UNREMARKABLE    NG tube is out  POD # 3 NG out General surgery will probably advance diet tomorrow Appreciate General surgery care

## 2017-08-12 NOTE — Progress Notes (Signed)
PROGRESS NOTE    [name]  HCW:[id number] DOB: 27-Mar-1975 DOA: 08/08/2017 PCP: Kathyrn Lass, MD    Brief Narrative: [name] is a 42 y.o. female with medical history significant of depression, GERD, tobacco abuse, substance abuse, s/p of C section on 07/25/17 (not doing breast-feeding currently) who presents with nausea, vomiting, abdominal pain. She was found to have SBO, with possible incarcerated hernia. She was also found to have severe hypokalemia and admitted to medical service, and surgery consulted.  Ob/GYN consulted by surgeon and she underwent laparoscopy with lysis of adhesions, small bowel resection   on 08/09/2017.     Assessment & Plan:   Principal Problem:   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 08/09/2017 Active Problems:   S/P cesarean section   SBO (small bowel obstruction) (HCC)   Tobacco abuse   GERD (gastroesophageal reflux disease)   Substance abuse (HCC)   Hypokalemia   Leukocytosis   Dehiscence of fascia s/p C/S 07/25/2017   SBO, incarcerated Hernia with ischemic bowel ; S/p lap with lysis of adhesions and bowel resection. By Dr Johney Maine on 08/09/2017.  Pain control with IV dilaudid.   continue IV fluids for hydration.  Incision site clean. Recommend PT eval to get the patient out of bed and ambulate.  D/c NG tube and Foley catheter.    Hypokalemia: replaced. Keep K>4, Mag >2. Replaced.  No change in meds.    Hyponatremia: mild probably from dehydration.    Tobacco abuse and substance abuse: On suboxone sub lingual.  On nicotine patch.    S/p recent C section:  Ob gyn on board.    Anemia: Mild probably from acute anemia of blood loss. Transfuse to keep hemoglobin greater than 7. Currently hemoglobin is stable around 10.      DVT prophylaxis: scd's/ Lovenox.  Code Status: full code.  Family Communication: none at bedside.  Disposition Plan: pending clinical improvement. PT evaluation.   Consultants:    Surgery.   OB/gyn  Procedures:  Lap, lysis of adhesions and bowel resection.   Antimicrobials:    Subjective: Pt very reluctant to get out of bed, or change the dressings.  Requesting pain meds before getting out of bed.   Objective: Vitals:   08/11/17 1303 08/11/17 2052 08/12/17 0448 08/12/17 1505  BP: 110/74 109/68 96/62 99/60   Pulse: 92 (!) 103 89 90  Resp: (!) 22 20 20    Temp:  98.4 F (36.9 C) 98.1 F (36.7 C) 99 F (37.2 C)  TempSrc:  Oral Oral Oral  SpO2: 98% 100% 96% 100%  Weight:      Height:        Intake/Output Summary (Last 24 hours) at 08/12/2017 1736 Last data filed at 08/12/2017 1526 Gross per 24 hour  Intake 4608.66 ml  Output 1565 ml  Net 3043.66 ml   Filed Weights   08/08/17 1956 08/09/17 0155  Weight: 55.8 kg (123 lb) 60.4 kg (133 lb 2.5 oz)    Examination:  General exam: NG tube present. Restless but no tin distress.  Respiratory system: diminished at bases, no wheezing or rhonchi.  Cardiovascular system: S1 & S2 heard, RRR. No JVD,  No pedal edema. Gastrointestinal system: Abdomen is soft , tender at the incision site. Non distended bowel sounds minimal.  Central nervous system:alert and oriented. Non focal. Extremities: no pedal edema. .no cyanosis or clubbing.  Skin: No rashes, lesions or ulcers Psychiatry: flat affect.     Data Reviewed: I have personally reviewed  following labs and imaging studies  CBC: Recent Labs  Lab 08/08/17 2044 08/09/17 0240 08/10/17 0440 08/11/17 0359 08/12/17 0322  WBC 16.3* 13.7* 12.6* 13.0* 15.5*  NEUTROABS 12.2*  --  10.3* 10.0* 12.4*  HGB 13.2 11.6* 11.3* 11.0* 10.7*  HCT 38.7 34.7* 33.8* 32.9* 32.5*  MCV 88.2 89.4 89.4 90.6 90.3  PLT 352 308 340 352 194   Basic Metabolic Panel: Recent Labs  Lab 08/09/17 0240 08/09/17 0617 08/10/17 0440 08/11/17 0359 08/12/17 0322  NA 132* 133* 133* 135 132*  K 2.7* 3.0* 3.6 4.5 4.9  CL 81* 87* 94* 102 105  CO2 37* 37* 33* 26 21*  GLUCOSE 107* 94  132* 102* 96  BUN 16 14 12 8 8   CREATININE 0.71 0.60 0.67 0.57 0.50  CALCIUM 8.3* 8.1* 7.9* 8.0* 7.6*  MG 1.9 2.4 2.1 1.9 1.8   GFR: Estimated Creatinine Clearance: 79.1 mL/min (by C-G formula based on SCr of 0.5 mg/dL). Liver Function Tests: Recent Labs  Lab 08/08/17 2044 08/09/17 0240  AST 16 18  ALT 22 18  ALKPHOS 104 92  BILITOT 0.4 0.6  PROT 7.4 6.3*  ALBUMIN 3.6 3.0*   Recent Labs  Lab 08/08/17 2044  LIPASE 36   No results for input(s): AMMONIA in the last 168 hours. Coagulation Profile: Recent Labs  Lab 08/09/17 0240  INR 0.94   Cardiac Enzymes: No results for input(s): CKTOTAL, CKMB, CKMBINDEX, TROPONINI in the last 168 hours. BNP (last 3 results) No results for input(s): PROBNP in the last 8760 hours. HbA1C: No results for input(s): HGBA1C in the last 72 hours. CBG: No results for input(s): GLUCAP in the last 168 hours. Lipid Profile: No results for input(s): CHOL, HDL, LDLCALC, TRIG, CHOLHDL, LDLDIRECT in the last 72 hours. Thyroid Function Tests: No results for input(s): TSH, T4TOTAL, FREET4, T3FREE, THYROIDAB in the last 72 hours. Anemia Panel: No results for input(s): VITAMINB12, FOLATE, FERRITIN, TIBC, IRON, RETICCTPCT in the last 72 hours. Sepsis Labs: Recent Labs  Lab 08/09/17 0240 08/09/17 0617  PROCALCITON <0.10  --   LATICACIDVEN 0.7 0.6    Recent Results (from the past 240 hour(s))  Urine Culture     Status: None   Collection Time: 08/09/17  1:29 AM  Result Value Ref Range Status   Specimen Description   Final    URINE, CLEAN CATCH Performed at [facility], [place] [address], [place], [place] [address]    Special Requests   Final    NONE Performed at [facility], [place] [address], [place], [place] [address]    Culture   Final    NO GROWTH Performed at [facility], [place] [address] [place], [place] [address]    Report Status 08/10/2017 FINAL  Final  Culture, blood (Routine X 2) w Reflex  to ID Panel     Status: None (Preliminary result)   Collection Time: 08/09/17  2:40 AM  Result Value Ref Range Status   Specimen Description BLOOD RIGHT ARM  Final   Special Requests   Final    BOTTLES DRAWN AEROBIC AND ANAEROBIC Blood Culture adequate volume Performed at [place] [address], [place], [place] [address]    Culture   Final    NO GROWTH 3 DAYS Performed at [facility], [place] [address], [place], [place] [address]    Report Status PENDING  Incomplete  Culture, blood (Routine X 2) w Reflex to ID Panel  Status: None (Preliminary result)   Collection Time: 08/09/17  2:40 AM  Result Value Ref Range Status   Specimen Description   Final    BLOOD LEFT HAND Performed at [place] [address], [place], [place] [address]    Special Requests   Final    BOTTLES DRAWN AEROBIC AND ANAEROBIC Blood Culture adequate volume Performed at [place] [address], [place], [place] [address]    Culture   Final    NO GROWTH 3 DAYS Performed at [facility], [place] [address] [place], [place] [address]    Report Status PENDING  Incomplete  MRSA PCR Screening     Status: None   Collection Time: 08/09/17  9:50 AM  Result Value Ref Range Status   MRSA by PCR NEGATIVE NEGATIVE Final    Comment:        The GeneXpert MRSA Assay (FDA approved for NASAL specimens only), is one component of a comprehensive MRSA colonization surveillance program. It is not intended to diagnose MRSA infection nor to guide or monitor treatment for MRSA infections. Performed at [facility], [place] [address] [place], [place] [address]          Radiology Studies: No results found.      Scheduled Meds: . buprenorphine-naloxone  1 tablet Sublingual Daily  . enoxaparin (LOVENOX) injection  40 mg Subcutaneous Q24H  . gabapentin  300 mg Oral TID  . ibuprofen  800 mg Oral TID  . lip balm  1  application Topical BID  . nicotine  21 mg Transdermal Daily  . scopolamine  1 patch Transdermal Q72H   Continuous Infusions: . acetaminophen    . dextrose 5 % and 0.45 % NaCl with KCl 40 mEq/L 100 mL/hr at 08/12/17 1054  . famotidine (PEPCID) IV Stopped (08/12/17 1126)  . lactated ringers    . methocarbamol (ROBAXIN)  IV Stopped (08/12/17 1126)     LOS: 4 days    Time spent: 35 minutes    Hosie Poisson, MD [facility] Pager [phone]  If 7PM-7AM, please contact night-coverage [email or web address] Password [facility] 08/12/2017, 5:36 PM

## 2017-08-12 NOTE — Progress Notes (Signed)
Incision dressings removed x 4. Sites are WNL. No redness, swelling or drainage noted. Incision sites gently cleansed and left open to air.  Patient refused to allow RN to remove umbilical tape ribbon wicks from incision. She states she prefers the surgeon to remove these and the [place].

## 2017-08-12 NOTE — Progress Notes (Signed)
Patient ID: [name], female   DOB: 05/02/1975, 42 y.o.   MRN: [id number] [facility] Progress Note:   3 Days Post-Op  Subjective: Mental status is clear but subdued Objective: Vital signs in last 24 hours: Temp:  [98.1 F (36.7 C)-98.4 F (36.9 C)] 98.1 F (36.7 C) (07/07 0448) Pulse Rate:  [89-103] 89 (07/07 0448) Resp:  [20-22] 20 (07/07 0448) BP: (96-110)/(62-74) 96/62 (07/07 0448) SpO2:  [96 %-100 %] 96 % (07/07 0448)  Intake/Output from previous day: 07/06 0701 - 07/07 0700 In: 3423 [P.O.:380; I.V.:2400; IV Piggyback:643] Out: 2015 [Urine:2000; Drains:15] Intake/Output this shift: Total I/O In: -  Out: 10 [Drains:10]  Physical Exam: Work of breathing is normal.  NG with scant drainage.  Maybe flatus.    Lab Results:  Results for orders placed or performed during the hospital encounter of 08/08/17 (from the past 48 hour(s))  Basic metabolic panel     Status: Abnormal   Collection Time: 08/11/17  3:59 AM  Result Value Ref Range   Sodium 135 135 - 145 mmol/L   Potassium 4.5 3.5 - 5.1 mmol/L    Comment: DELTA CHECK NOTED NO VISIBLE HEMOLYSIS    Chloride 102 98 - 111 mmol/L    Comment: Please note change in reference range.   CO2 26 22 - 32 mmol/L   Glucose, Bld 102 (H) 70 - 99 mg/dL    Comment: Please note change in reference range.   BUN 8 6 - 20 mg/dL    Comment: Please note change in reference range.   Creatinine, Ser 0.57 0.44 - 1.00 mg/dL   Calcium 8.0 (L) 8.9 - 10.3 mg/dL   GFR calc non Af Amer >60 >60 mL/min   GFR calc Af Amer >60 >60 mL/min    Comment: (NOTE) The eGFR has been calculated using the CKD EPI equation. This calculation has not been validated in all clinical situations. eGFR's persistently <60 mL/min signify possible Chronic Kidney Disease.    Anion gap 7 5 - 15    Comment: Performed at [facility], [place] [address] [place], [place] [address]  Magnesium     Status: None   Collection Time: 08/11/17   3:59 AM  Result Value Ref Range   Magnesium 1.9 1.7 - 2.4 mg/dL    Comment: Performed at [facility], [place] [address], [place], [place] [address]  CBC with Differential/Platelet     Status: Abnormal   Collection Time: 08/11/17  3:59 AM  Result Value Ref Range   WBC 13.0 (H) 4.0 - 10.5 K/uL   RBC 3.63 (L) 3.87 - 5.11 MIL/uL   Hemoglobin 11.0 (L) 12.0 - 15.0 g/dL   HCT 32.9 (L) 36.0 - 46.0 %   MCV 90.6 78.0 - 100.0 fL   MCH 30.3 26.0 - 34.0 pg   MCHC 33.4 30.0 - 36.0 g/dL   RDW 12.9 11.5 - 15.5 %   Platelets 352 150 - 400 K/uL   Neutrophils Relative % 77 %   Lymphocytes Relative 13 %   Monocytes Relative 7 %   Eosinophils Relative 3 %   Basophils Relative 0 %   Neutro Abs 10.0 (H) 1.7 - 7.7 K/uL   Lymphs Abs 1.7 0.7 - 4.0 K/uL   Monocytes Absolute 0.9 0.1 - 1.0 K/uL   Eosinophils Absolute 0.4 0.0 - 0.7 K/uL   Basophils Absolute 0.0 0.0 - 0.1 K/uL   Smear Review MORPHOLOGY UNREMARKABLE     Comment: Performed at  [facility], [place] [address] [place], [place] [address]  Basic metabolic panel     Status: Abnormal   Collection Time: 08/12/17  3:22 AM  Result Value Ref Range   Sodium 132 (L) 135 - 145 mmol/L   Potassium 4.9 3.5 - 5.1 mmol/L   Chloride 105 98 - 111 mmol/L    Comment: Please note change in reference range.   CO2 21 (L) 22 - 32 mmol/L   Glucose, Bld 96 70 - 99 mg/dL    Comment: Please note change in reference range.   BUN 8 6 - 20 mg/dL    Comment: Please note change in reference range.   Creatinine, Ser 0.50 0.44 - 1.00 mg/dL   Calcium 7.6 (L) 8.9 - 10.3 mg/dL   GFR calc non Af Amer >60 >60 mL/min   GFR calc Af Amer >60 >60 mL/min    Comment: (NOTE) The eGFR has been calculated using the CKD EPI equation. This calculation has not been validated in all clinical situations. eGFR's persistently <60 mL/min signify possible Chronic Kidney Disease.    Anion gap 6 5 - 15    Comment: Performed at [facility],  [place] [address] [place], [place] [address]  Magnesium     Status: None   Collection Time: 08/12/17  3:22 AM  Result Value Ref Range   Magnesium 1.8 1.7 - 2.4 mg/dL    Comment: Performed at [facility], [place] [address], [place], [place] [address]  CBC with Differential/Platelet     Status: Abnormal   Collection Time: 08/12/17  3:22 AM  Result Value Ref Range   WBC 15.5 (H) 4.0 - 10.5 K/uL   RBC 3.60 (L) 3.87 - 5.11 MIL/uL   Hemoglobin 10.7 (L) 12.0 - 15.0 g/dL   HCT 32.5 (L) 36.0 - 46.0 %   MCV 90.3 78.0 - 100.0 fL   MCH 29.7 26.0 - 34.0 pg   MCHC 32.9 30.0 - 36.0 g/dL   RDW 12.9 11.5 - 15.5 %   Platelets 345 150 - 400 K/uL   Neutrophils Relative % 80 %   Lymphocytes Relative 11 %   Monocytes Relative 7 %   Eosinophils Relative 2 %   Basophils Relative 0 %   Neutro Abs 12.4 (H) 1.7 - 7.7 K/uL   Lymphs Abs 1.7 0.7 - 4.0 K/uL   Monocytes Absolute 1.1 (H) 0.1 - 1.0 K/uL   Eosinophils Absolute 0.3 0.0 - 0.7 K/uL   Basophils Absolute 0.0 0.0 - 0.1 K/uL   Smear Review MORPHOLOGY UNREMARKABLE     Comment: Performed at [facility], [place] [address], [place], [place] [address]    Radiology/Results: No results found.  Anti-infectives: Anti-infectives (From admission, onward)   Start     Dose/Rate Route Frequency Ordered Stop   08/09/17 2030  cefoTEtan (CEFOTAN) 2 g in sodium chloride 0.9 % 100 mL IVPB     2 g 200 mL/hr over 30 Minutes Intravenous Every 12 hours 08/09/17 1353 08/09/17 2256   08/09/17 1347  clindamycin (CLEOCIN) 900 mg, gentamicin (GARAMYCIN) 240 mg in sodium chloride 0.9 % 1,000 mL for intraperitoneal lavage  Status:  Discontinued       As needed 08/09/17 1348 08/09/17 1348   08/09/17 0843  sodium chloride 0.9 % with cefoTEtan (CEFOTAN) ADS Med    Note to Pharmacy:  Dione Booze   : cabinet override      08/09/17 0843 08/09/17 1028   08/09/17  0815  clindamycin (CLEOCIN) 900 mg, gentamicin (GARAMYCIN) 240 mg in sodium  chloride 0.9 % 1,000 mL for intraperitoneal lavage    Note to Pharmacy:  Have in the  OR room for final irrigation in bowel surgery case to minimize risk of abscess/infection Pharmacy may adjust dosing strength, schedule, rate of infusion, etc as needed to optimize therapy    Intraperitoneal On call to O.R. 08/09/17 1607 08/10/17 0559   08/09/17 0815  cefoTEtan in Dextrose 5% (CEFOTAN) IVPB 2 g  Status:  Discontinued     2 g 100 mL/hr over 30 Minutes Intravenous On call to O.R. 08/09/17 3710 08/09/17 0813   08/09/17 0815  cefoTEtan (CEFOTAN) 2 g in sodium chloride 0.9 % 100 mL IVPB     2 g 200 mL/hr over 30 Minutes Intravenous On call to O.R. 08/09/17 0814 08/09/17 1043      Assessment/Plan: Problem List: Patient Active Problem List   Diagnosis Date Noted  . SBO (small bowel obstruction) ([place]) 08/09/2017  . Tobacco abuse 08/09/2017  . GERD (gastroesophageal reflux disease) 08/09/2017  . Hypokalemia 08/09/2017  . Leukocytosis 08/09/2017  . Dehiscence of fascia s/p C/S 07/25/2017 08/09/2017  . 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 08/09/2017 08/09/2017  . Substance abuse ([place])   . Pregnancy 07/25/2017  . Vaginal bleeding in pregnancy, second trimester 04/06/2017  . IUGR (intrauterine growth restriction) 02/28/2015  . S/P cesarean section 02/28/2015  . Radiculopathy 06/04/2014  . Low back pain 09/23/2012    Will discontinue NG and keep npo for now.   3 Days Post-Op    LOS: 4 days   Matt B. Hassell Done, MD, [facility] [phone] beeper [phone]  08/12/2017 8:47 AM

## 2017-08-13 ENCOUNTER — Encounter (HOSPITAL_COMMUNITY): Payer: Self-pay

## 2017-08-13 LAB — BASIC METABOLIC PANEL
ANION GAP: 6 (ref 5–15)
BUN: 8 mg/dL (ref 6–20)
CALCIUM: 7.8 mg/dL — AB (ref 8.9–10.3)
CHLORIDE: 106 mmol/L (ref 98–111)
CO2: 21 mmol/L — AB (ref 22–32)
CREATININE: 0.49 mg/dL (ref 0.44–1.00)
GFR calc non Af Amer: >60 mL/min (ref >60)
Glucose, Bld: 115 mg/dL — ABNORMAL HIGH (ref 70–99)
Potassium: 4.3 mmol/L (ref 3.5–5.1)
SODIUM: 133 mmol/L — AB (ref 135–145)

## 2017-08-13 LAB — CBC WITH DIFFERENTIAL/PLATELET
BASOS ABS: 0 10*3/uL (ref 0.0–0.1)
Basophils Relative: 0 %
Eosinophils Absolute: 0.3 10*3/uL (ref 0.0–0.7)
Eosinophils Relative: 3 %
HEMATOCRIT: 28.5 % — AB (ref 36.0–46.0)
HEMOGLOBIN: 9.4 g/dL — AB (ref 12.0–15.0)
LYMPHS PCT: 16 %
Lymphs Abs: 1.9 10*3/uL (ref 0.7–4.0)
MCH: 29.2 pg (ref 26.0–34.0)
MCHC: 33 g/dL (ref 30.0–36.0)
MCV: 88.5 fL (ref 78.0–100.0)
Monocytes Absolute: 1 10*3/uL (ref 0.1–1.0)
Monocytes Relative: 9 %
NEUTROS PCT: 72 %
Neutro Abs: 8.3 10*3/uL (ref 1.7–7.7)
Platelets: 343 10*3/uL (ref 150–400)
RBC: 3.22 MIL/uL — AB (ref 3.87–5.11)
RDW: 13 % (ref 11.5–15.5)
WBC: 11.5 10*3/uL — AB (ref 4.0–10.5)

## 2017-08-13 LAB — MAGNESIUM: MAGNESIUM: 1.6 mg/dL — AB (ref 1.7–2.4)

## 2017-08-13 LAB — RETICULOCYTES
RBC.: 3.16 MIL/uL — ABNORMAL LOW (ref 3.87–5.11)
RETIC CT PCT: 2.4 % (ref 0.4–3.1)
Retic Count, Absolute: 75.8 10*3/uL (ref 19.0–186.0)

## 2017-08-13 MED ORDER — ACETAMINOPHEN 500 MG PO TABS
1000.0000 mg | ORAL_TABLET | Freq: Three times a day (TID) | ORAL | Status: DC
  Filled 2017-08-13: qty 2

## 2017-08-13 MED ORDER — DEXTROSE 5 % IV SOLN
3.0000 g | Freq: Once | INTRAVENOUS | Status: AC
  Administered 2017-08-13: 3 g via INTRAVENOUS
  Filled 2017-08-13: qty 6

## 2017-08-13 NOTE — Progress Notes (Signed)
OBSTETRICS  POD 4 EL w/ sm bowel resection  Results for orders placed or performed during the hospital encounter of 08/08/17 (from the past 24 hour(s))  Basic metabolic panel     Status: Abnormal   Collection Time: 08/13/17  7:59 AM  Result Value Ref Range   Sodium 133 (L) 135 - 145 mmol/L   Potassium 4.3 3.5 - 5.1 mmol/L   Chloride 106 98 - 111 mmol/L   CO2 21 (L) 22 - 32 mmol/L   Glucose, Bld 115 (H) 70 - 99 mg/dL   BUN 8 6 - 20 mg/dL   Creatinine, Ser 0.49 0.44 - 1.00 mg/dL   Calcium 7.8 (L) 8.9 - 10.3 mg/dL   GFR calc non Af Amer >60 >60 mL/min   GFR calc Af Amer >60 >60 mL/min   Anion gap 6 5 - 15  Magnesium     Status: Abnormal   Collection Time: 08/13/17  7:59 AM  Result Value Ref Range   Magnesium 1.6 (L) 1.7 - 2.4 mg/dL  CBC with Differential/Platelet     Status: Abnormal   Collection Time: 08/13/17  7:59 AM  Result Value Ref Range   WBC 11.5 (H) 4.0 - 10.5 K/uL   RBC 3.22 (L) 3.87 - 5.11 MIL/uL   Hemoglobin 9.4 (L) 12.0 - 15.0 g/dL   HCT 28.5 (L) 36.0 - 46.0 %   MCV 88.5 78.0 - 100.0 fL   MCH 29.2 26.0 - 34.0 pg   MCHC 33.0 30.0 - 36.0 g/dL   RDW 13.0 11.5 - 15.5 %   Platelets 343 150 - 400 K/uL   Neutrophils Relative % 72 %   Neutro Abs 8.3 1.7 - 7.7 K/uL   Lymphocytes Relative 16 %   Lymphs Abs 1.9 0.7 - 4.0 K/uL   Monocytes Relative 9 %   Monocytes Absolute 1.0 0.1 - 1.0 K/uL   Eosinophils Relative 3 %   Eosinophils Absolute 0.3 0.0 - 0.7 K/uL   Basophils Relative 0 %   Basophils Absolute 0.0 0.0 - 0.1 K/uL   Smear Review MORPHOLOGY UNREMARKABLE    S// feeling much improved, diet being advanced today, drarins out  O//BP 98/66 (BP Location: Right Arm)   Pulse 91   Temp 97.7 F (36.5 C) (Oral)   Resp 18   Ht 5\' 4"  (1.626 m)   Wt 133 lb 2.5 oz (60.4 kg)   LMP  (LMP Unknown)   SpO2 100%   Breastfeeding? No   BMI 22.86 kg/m   abd + BS, soft Inc C/D  A+P// advancing diet, ambulating

## 2017-08-13 NOTE — Progress Notes (Signed)
[name]  GGE:[id number] DOB: 03/27/1975 DOA: 08/08/2017 PCP: Kathyrn Lass, MD    Brief Narrative: [name] is a 42 y.o. female with medical history significant of depression, GERD, tobacco abuse, substance abuse, s/p of C section on 07/25/17 (not doing breast-feeding currently) who presents with nausea, vomiting, abdominal pain. She was found to have SBO, with possible incarcerated hernia. She was also found to have severe hypokalemia and admitted to medical service, and surgery consulted.  Ob/GYN consulted by surgeon and she underwent laparoscopy with lysis of adhesions, small bowel resection   on 08/09/2017.     Assessment & Plan:   Principal Problem:   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 08/09/2017 Active Problems:   S/P cesarean section   SBO (small bowel obstruction) (HCC)   Tobacco abuse   GERD (gastroesophageal reflux disease)   Substance abuse (HCC)   Hypokalemia   Leukocytosis   Dehiscence of fascia s/p C/S 07/25/2017   SBO, incarcerated Hernia with ischemic bowel ; S/p lap with lysis of adhesions and bowel resection. By Dr Johney Maine on 08/09/2017.  Incision site clean. Recommend PT eval to get the patient out of bed and ambulate.  D/c NG tube and Foley catheter.  Started on clears, advance as tolerated.  D/c drain today and ambulate.  Her pain is controlled.    Hypokalemia: replaced. Keep K>4, Mag >2. Replaced.  No change in meds.    Hyponatremia: mild probably from dehydration.    Tobacco abuse and substance abuse: On suboxone sub lingual.  On nicotine patch.    S/p recent C section:  Ob gyn on board.    Anemia: Mild probably from acute anemia of blood loss. Transfuse to keep hemoglobin greater than 7. Currently hemoglobin is 9.4. Get anemia panel.    DVT prophylaxis: scd's/ Lovenox.  Code Status: full code.  Family Communication: none at bedside.  Disposition Plan: possible dc/ in am if able to tolerate  soft diet   Consultants:   Surgery.   OB/gyn  Procedures:  Lap, lysis of adhesions and bowel resection.   Antimicrobials: none.    Subjective: Pt in good spirits today, pain controlled.  No nausea.   Objective: Vitals:   08/12/17 1505 08/12/17 2132 08/13/17 0624 08/13/17 1352  BP: 99/60 98/62 98/66  (!) 87/75  Pulse: 90 (!) 101 91 99  Resp:  18 18 16   Temp: 99 F (37.2 C) 99.1 F (37.3 C) 97.7 F (36.5 C) 97.6 F (36.4 C)  TempSrc: Oral Oral Oral Oral  SpO2: 100% 95% 100% 100%  Weight:      Height:        Intake/Output Summary (Last 24 hours) at 08/13/2017 1658 Last data filed at 08/13/2017 1451 Gross per 24 hour  Intake 2478.51 ml  Output 25 ml  Net 2453.51 ml   Filed Weights   08/08/17 1956 08/09/17 0155  Weight: 55.8 kg (123 lb) 60.4 kg (133 lb 2.5 oz)    Examination:  General exam: alert , comfortable.  Respiratory system: diminished at bases, no wheezing or rhonchi.  Cardiovascular system: S1 & S2 heard, RRR. No JVD,  No pedal edema. Gastrointestinal system: Abdomen is soft , incision sites clean. Bowel sounds minimal.  Central nervous system:alert and oriented. Non focal. Extremities: no pedal edema. .no cyanosis or clubbing.  Skin: No rashes, lesions or ulcers Psychiatry: flat affect.     Data Reviewed: I have personally reviewed following labs and imaging studies  CBC:  Recent Labs  Lab 08/08/17 2044 08/09/17 0240 08/10/17 0440 08/11/17 0359 08/12/17 0322 08/13/17 0759  WBC 16.3* 13.7* 12.6* 13.0* 15.5* 11.5*  NEUTROABS 12.2*  --  10.3* 10.0* 12.4* 8.3  HGB 13.2 11.6* 11.3* 11.0* 10.7* 9.4*  HCT 38.7 34.7* 33.8* 32.9* 32.5* 28.5*  MCV 88.2 89.4 89.4 90.6 90.3 88.5  PLT 352 308 340 352 345 376   Basic Metabolic Panel: Recent Labs  Lab 08/09/17 0617 08/10/17 0440 08/11/17 0359 08/12/17 0322 08/13/17 0759  NA 133* 133* 135 132* 133*  K 3.0* 3.6 4.5 4.9 4.3  CL 87* 94* 102 105 106  CO2 37* 33* 26 21* 21*  GLUCOSE 94 132* 102* 96  115*  BUN 14 12 8 8 8   CREATININE 0.60 0.67 0.57 0.50 0.49  CALCIUM 8.1* 7.9* 8.0* 7.6* 7.8*  MG 2.4 2.1 1.9 1.8 1.6*   GFR: Estimated Creatinine Clearance: 79.1 mL/min (by C-G formula based on SCr of 0.49 mg/dL). Liver Function Tests: Recent Labs  Lab 08/08/17 2044 08/09/17 0240  AST 16 18  ALT 22 18  ALKPHOS 104 92  BILITOT 0.4 0.6  PROT 7.4 6.3*  ALBUMIN 3.6 3.0*   Recent Labs  Lab 08/08/17 2044  LIPASE 36   No results for input(s): AMMONIA in the last 168 hours. Coagulation Profile: Recent Labs  Lab 08/09/17 0240  INR 0.94   Cardiac Enzymes: No results for input(s): CKTOTAL, CKMB, CKMBINDEX, TROPONINI in the last 168 hours. BNP (last 3 results) No results for input(s): PROBNP in the last 8760 hours. HbA1C: No results for input(s): HGBA1C in the last 72 hours. CBG: No results for input(s): GLUCAP in the last 168 hours. Lipid Profile: No results for input(s): CHOL, HDL, LDLCALC, TRIG, CHOLHDL, LDLDIRECT in the last 72 hours. Thyroid Function Tests: No results for input(s): TSH, T4TOTAL, FREET4, T3FREE, THYROIDAB in the last 72 hours. Anemia Panel: No results for input(s): VITAMINB12, FOLATE, FERRITIN, TIBC, IRON, RETICCTPCT in the last 72 hours. Sepsis Labs: Recent Labs  Lab 08/09/17 0240 08/09/17 0617  PROCALCITON <0.10  --   LATICACIDVEN 0.7 0.6    Recent Results (from the past 240 hour(s))  Urine Culture     Status: None   Collection Time: 08/09/17  1:29 AM  Result Value Ref Range Status   Specimen Description   Final    URINE, CLEAN CATCH Performed at [facility], [place] [address] [place], [place] [address]    Special Requests   Final    NONE Performed at [facility], [place] [address], [place], [place] [address]    Culture   Final    NO GROWTH Performed at [facility], [place] [address], [place], [place] [address]    Report Status 08/10/2017 FINAL  Final  Culture, blood (Routine X 2) w Reflex to ID  Panel     Status: None (Preliminary result)   Collection Time: 08/09/17  2:40 AM  Result Value Ref Range Status   Specimen Description BLOOD RIGHT ARM  Final   Special Requests   Final    BOTTLES DRAWN AEROBIC AND ANAEROBIC Blood Culture adequate volume Performed at [place] [address] [place], [place] [address]    Culture   Final    NO GROWTH 4 DAYS Performed at [facility], [place] [address], [place], [place] [address]    Report Status PENDING  Incomplete  Culture, blood (Routine X 2) w Reflex to ID Panel  Status: None (Preliminary result)   Collection Time: 08/09/17  2:40 AM  Result Value Ref Range Status   Specimen Description   Final    BLOOD LEFT HAND Performed at [place] [address] [place], [place] [address]    Special Requests   Final    BOTTLES DRAWN AEROBIC AND ANAEROBIC Blood Culture adequate volume Performed at [place] [address], [place], [place] [address]    Culture   Final    NO GROWTH 4 DAYS Performed at [facility], [place] [address], [place], [place] [address]    Report Status PENDING  Incomplete  MRSA PCR Screening     Status: None   Collection Time: 08/09/17  9:50 AM  Result Value Ref Range Status   MRSA by PCR NEGATIVE NEGATIVE Final    Comment:        The GeneXpert MRSA Assay (FDA approved for NASAL specimens only), is one component of a comprehensive MRSA colonization surveillance program. It is not intended to diagnose MRSA infection nor to guide or monitor treatment for MRSA infections. Performed at [facility], [place] [address], [place], [place] [address]          Radiology Studies: No results found.      Scheduled Meds: . acetaminophen  1,000 mg Oral Q8H  . buprenorphine-naloxone  1 tablet Sublingual Daily  . enoxaparin (LOVENOX) injection  40 mg Subcutaneous Q24H  . gabapentin  300 mg Oral TID  . ibuprofen  800  mg Oral TID  . lip balm  1 application Topical BID  . nicotine  21 mg Transdermal Daily  . scopolamine  1 patch Transdermal Q72H   Continuous Infusions: . dextrose 5 % and 0.45 % NaCl with KCl 40 mEq/L 50 mL/hr at 08/13/17 1027  . famotidine (PEPCID) IV Stopped (08/13/17 1104)  . methocarbamol (ROBAXIN)  IV Stopped (08/13/17 1104)     LOS: 5 days    Time spent: 35 minutes    Hosie Poisson, MD [facility] Pager [phone]  If 7PM-7AM, please contact night-coverage [email or web address] Password [facility] 08/13/2017, 4:58 PM

## 2017-08-13 NOTE — Progress Notes (Signed)
[facility] Progress Note  4 Days Post-Op  Subjective: CC: abdominal pain Patient reports pulling abdominal pain from drain site. Tolerating NGT removal and is glad it is no longer irritating nose. Passing flatus and denies nausea. Ambulating some but pain from drain with ambulation.   Objective: Vital signs in last 24 hours: Temp:  [97.7 F (36.5 C)-99.1 F (37.3 C)] 97.7 F (36.5 C) (07/08 0624) Pulse Rate:  [90-101] 91 (07/08 0624) Resp:  [18] 18 (07/08 0624) BP: (98-99)/(60-66) 98/66 (07/08 0624) SpO2:  [95 %-100 %] 100 % (07/08 0624) Last BM Date: (unknown)  Intake/Output from previous day: 07/07 0701 - 07/08 0700 In: 1385.7 [P.O.:120; I.V.:1143.3; IV Piggyback:122.3] Out: 360 [Urine:350; Drains:10] Intake/Output this shift: No intake/output data recorded.  PE: Gen:  Alert, NAD, pleasant Card:  Regular rate and rhythm, pedal pulses 2+ BL Pulm:  Normal effort, clear to auscultation bilaterally Abd: Soft, appropriately tender, non-distended, bowel sounds present, no HSM, incisions C/D/I with wick present bilaterally, drain with small amount serosanguinous drainage Skin: warm and dry, no rashes  Psych: A&Ox3   Lab Results:  Recent Labs    08/12/17 0322 08/13/17 0759  WBC 15.5* 11.5*  HGB 10.7* 9.4*  HCT 32.5* 28.5*  PLT 345 343   BMET Recent Labs    08/12/17 0322 08/13/17 0759  NA 132* 133*  K 4.9 4.3  CL 105 106  CO2 21* 21*  GLUCOSE 96 115*  BUN 8 8  CREATININE 0.50 0.49  CALCIUM 7.6* 7.8*   PT/INR No results for input(s): LABPROT, INR in the last 72 hours. CMP     Component Value Date/Time   NA 133 (L) 08/13/2017 0759   K 4.3 08/13/2017 0759   CL 106 08/13/2017 0759   CO2 21 (L) 08/13/2017 0759   GLUCOSE 115 (H) 08/13/2017 0759   BUN 8 08/13/2017 0759   CREATININE 0.49 08/13/2017 0759   CALCIUM 7.8 (L) 08/13/2017 0759   PROT 6.3 (L) 08/09/2017 0240   ALBUMIN 3.0 (L) 08/09/2017 0240   AST 18 08/09/2017 0240   ALT 18  08/09/2017 0240   ALKPHOS 92 08/09/2017 0240   BILITOT 0.6 08/09/2017 0240   GFRNONAA >60 08/13/2017 0759   GFRAA >60 08/13/2017 0759   Lipase     Component Value Date/Time   LIPASE 36 08/08/2017 2044       Studies/Results: No results found.  Anti-infectives: Anti-infectives (From admission, onward)   Start     Dose/Rate Route Frequency Ordered Stop   08/09/17 2030  cefoTEtan (CEFOTAN) 2 g in sodium chloride 0.9 % 100 mL IVPB     2 g 200 mL/hr over 30 Minutes Intravenous Every 12 hours 08/09/17 1353 08/09/17 2256   08/09/17 1347  clindamycin (CLEOCIN) 900 mg, gentamicin (GARAMYCIN) 240 mg in sodium chloride 0.9 % 1,000 mL for intraperitoneal lavage  Status:  Discontinued       As needed 08/09/17 1348 08/09/17 1348   08/09/17 0843  sodium chloride 0.9 % with cefoTEtan (CEFOTAN) ADS Med    Note to Pharmacy:  Dione Booze   : cabinet override      08/09/17 0843 08/09/17 1028   08/09/17 0815  clindamycin (CLEOCIN) 900 mg, gentamicin (GARAMYCIN) 240 mg in sodium chloride 0.9 % 1,000 mL for intraperitoneal lavage    Note to Pharmacy:  Have in the  OR room for final irrigation in bowel surgery case to minimize risk of abscess/infection Pharmacy may adjust dosing strength, schedule, rate of infusion, etc as needed  to optimize therapy    Intraperitoneal On call to O.R. 08/09/17 5790 08/10/17 0559   08/09/17 0815  cefoTEtan in Dextrose 5% (CEFOTAN) IVPB 2 g  Status:  Discontinued     2 g 100 mL/hr over 30 Minutes Intravenous On call to O.R. 08/09/17 3833 08/09/17 0813   08/09/17 0815  cefoTEtan (CEFOTAN) 2 g in sodium chloride 0.9 % 100 mL IVPB     2 g 200 mL/hr over 30 Minutes Intravenous On call to O.R. 08/09/17 0814 08/09/17 1043       Assessment/Plan Hx of depression GERD Tobacco abuse Hx of substance abuse on suboxone Hypokalemia - resolved, K 4.3 Mild hyponatremia - Na 133  SBO secondary to abdominal/pelvic wall hernia s/p C-section 07/25/17 S/p diagnostic  laparopscopy, LOA, small bowel resection 08/09/17 Dr. Johney Maine - POD#4 - NGT removed yesterday and patient having flatus - start CLD, may advance to FLD tonight if tolerating well - removed wicks from incision and JP drain - ambulate more  FEN:  CLD ID:  Cefotetan pre op DVT:  lovenox Follow up:  Dr. Jaynee Eagles. Matthew Saras  Plan: Await return of bowel function. Start CLD. Pain control.     LOS: 5 days    Brigid Re , [facility] 08/13/2017, 9:38 AM Pager: [phone] Consults: [phone] Mon-Fri 7:00 am-4:30 pm Sat-Sun 7:00 am-11:30 am

## 2017-08-14 DIAGNOSIS — Z98891 History of uterine scar from previous surgery: Secondary | ICD-10-CM

## 2017-08-14 DIAGNOSIS — F1910 Other psychoactive substance abuse, uncomplicated: Secondary | ICD-10-CM

## 2017-08-14 DIAGNOSIS — Z720 Tobacco use: Secondary | ICD-10-CM

## 2017-08-14 DIAGNOSIS — K219 Gastro-esophageal reflux disease without esophagitis: Secondary | ICD-10-CM

## 2017-08-14 LAB — CULTURE, BLOOD (ROUTINE X 2)
CULTURE: NO GROWTH
Culture: NO GROWTH
SPECIAL REQUESTS: ADEQUATE
Special Requests: ADEQUATE

## 2017-08-14 LAB — CBC WITH DIFFERENTIAL/PLATELET
BASOS ABS: 0 10*3/uL (ref 0.0–0.1)
BASOS PCT: 0 %
EOS ABS: 0.4 10*3/uL (ref 0.0–0.7)
Eosinophils Relative: 4 %
HCT: 27.8 % — ABNORMAL LOW (ref 36.0–46.0)
Hemoglobin: 9.2 g/dL — ABNORMAL LOW (ref 12.0–15.0)
Lymphocytes Relative: 18 %
Lymphs Abs: 1.9 10*3/uL (ref 0.7–4.0)
MCH: 28.7 pg (ref 26.0–34.0)
MCHC: 33.1 g/dL (ref 30.0–36.0)
MCV: 86.6 fL (ref 78.0–100.0)
MONOS PCT: 6 %
Monocytes Absolute: 0.6 10*3/uL (ref 0.1–1.0)
Neutro Abs: 7.6 10*3/uL (ref 1.7–7.7)
Neutrophils Relative %: 72 %
PLATELETS: UNDETERMINED 10*3/uL (ref 150–400)
RBC: 3.21 MIL/uL — ABNORMAL LOW (ref 3.87–5.11)
RDW: 13 % (ref 11.5–15.5)
WBC: 10.4 10*3/uL (ref 4.0–10.5)

## 2017-08-14 LAB — BASIC METABOLIC PANEL
Anion gap: 6 (ref 5–15)
BUN: 6 mg/dL (ref 6–20)
CALCIUM: 7.8 mg/dL — AB (ref 8.9–10.3)
CHLORIDE: 110 mmol/L (ref 98–111)
CO2: 21 mmol/L — ABNORMAL LOW (ref 22–32)
CREATININE: 0.37 mg/dL — AB (ref 0.44–1.00)
GLUCOSE: 90 mg/dL (ref 70–99)
Potassium: 4.2 mmol/L (ref 3.5–5.1)
Sodium: 137 mmol/L (ref 135–145)

## 2017-08-14 LAB — IRON AND TIBC
Iron: 15 ug/dL — ABNORMAL LOW (ref 28–170)
SATURATION RATIOS: 10 % — AB (ref 10.4–31.8)
TIBC: 153 ug/dL — AB (ref 250–450)
UIBC: 138 ug/dL

## 2017-08-14 LAB — FERRITIN: Ferritin: 157 ng/mL (ref 11–307)

## 2017-08-14 LAB — VITAMIN B12: VITAMIN B 12: 1220 pg/mL — AB (ref 180–914)

## 2017-08-14 LAB — FOLATE: FOLATE: 7 ng/mL (ref >5.9)

## 2017-08-14 LAB — MAGNESIUM: Magnesium: 2 mg/dL (ref 1.7–2.4)

## 2017-08-14 MED ORDER — ENSURE ENLIVE PO LIQD: 237.0000 mL | Freq: Two times a day (BID) | ORAL | 12 refills | Status: DC

## 2017-08-14 MED ORDER — ENSURE ENLIVE PO LIQD: 237.0000 mL | Freq: Two times a day (BID) | ORAL | Status: DC

## 2017-08-14 MED ORDER — ACETAMINOPHEN 325 MG PO TABS: 650.0000 mg | ORAL_TABLET | ORAL | Status: DC | PRN

## 2017-08-14 MED ORDER — FAMOTIDINE 20 MG PO TABS: 20.0000 mg | ORAL_TABLET | Freq: Every day | ORAL | Status: DC

## 2017-08-14 MED ORDER — LORAZEPAM 0.5 MG PO TABS
0.5000 mg | ORAL_TABLET | Freq: Once | ORAL | Status: AC
  Administered 2017-08-14: 0.5 mg via ORAL
  Filled 2017-08-14: qty 1

## 2017-08-14 MED ORDER — ACETAMINOPHEN-CODEINE #3 300-30 MG PO TABS: 1.0000 | ORAL_TABLET | ORAL | Status: DC | PRN

## 2017-08-14 MED ORDER — HYDROCORTISONE 2.5 % RE CREA: 1.0000 "application " | TOPICAL_CREAM | Freq: Four times a day (QID) | RECTAL | 0 refills | Status: DC | PRN

## 2017-08-14 MED ORDER — HYDROCORTISONE 1 % EX CREA: 1.0000 "application " | TOPICAL_CREAM | Freq: Three times a day (TID) | CUTANEOUS | 0 refills | Status: DC | PRN

## 2017-08-14 NOTE — Progress Notes (Signed)
Reviewed discharge information with patient . Answered all questions. Pt able to teach back medications and reasons to contact MD/911. Patient verbalizes importance of PCP follow up appointment.  Kyriana Yankee M. Lakeshia Dohner, RN  

## 2017-08-14 NOTE — Progress Notes (Addendum)
5 Days Post-Op    CC: Abdominal pain  Subjective: Patient wants to go home this a.m. and see her baby.  We discussed her going from IV pain medicines to no pain meds.  Substance abuse doctor has moved to [place].  She has some Suboxone at home.  I am going to stop her IV Dilaudid I will leave her some p.o. Tylenol with codeine.  She does not want any more Robaxin and she is refusing the Neurontin.  Her abdominal incisions looks good she is tolerating p.o. diet, and having bowel movements.  From a surgical standpoint she can go home.  Objective: Vital signs in last 24 hours: Temp:  [97.6 F (36.4 C)-98 F (36.7 C)] 97.7 F (36.5 C) (07/09 0620) Pulse Rate:  [72-99] 78 (07/09 0625) Resp:  [16-18] 18 (07/08 2116) BP: (84-103)/(50-75) 92/57 (07/09 0625) SpO2:  [99 %-100 %] 99 % (07/09 0622) Last BM Date: 08/13/17 360 p.o. recorded 3600 IV Voided x3 BM x1 recorded. Pain control, IV Tylenol discontinued patient refusing p.o. Tylenol Suboxone 1 tablet daily patient refusing Neurontin.Dilaudid 1 mg 7 yesterday Robaxin 1 g IV x2 yesterday Labs: BMP is okay, magnesium is 2, iron studies are low.  B12 is elevated, WBC is normal 10.4, hemoglobin hematocrit are stable at 9.2 and 27.8.  No films today. Intake/Output from previous day: 07/08 0701 - 07/09 0700 In: 3688.5 [P.O.:360; I.V.:2879.2; IV Piggyback:449.3] Out: 25 [Drains:25] Intake/Output this shift: No intake/output data recorded.  General appearance: alert, cooperative and no distress Resp: clear to auscultation bilaterally GI: Soft, sore, tolerating diet positive flatus and BM.  Sites all look good.  Lab Results:  Recent Labs    08/13/17 0759 08/14/17 0719  WBC 11.5* 10.4  HGB 9.4* 9.2*  HCT 28.5* 27.8*  PLT 343 PLATELET CLUMPS NOTED ON SMEAR, UNABLE TO ESTIMATE    BMET Recent Labs    08/13/17 0759 08/14/17 0719  NA 133* 137  K 4.3 4.2  CL 106 110  CO2 21* 21*  GLUCOSE 115* 90  BUN 8 6  CREATININE 0.49 0.37*   CALCIUM 7.8* 7.8*   PT/INR No results for input(s): LABPROT, INR in the last 72 hours.  Recent Labs  Lab 08/08/17 2044 08/09/17 0240  AST 16 18  ALT 22 18  ALKPHOS 104 92  BILITOT 0.4 0.6  PROT 7.4 6.3*  ALBUMIN 3.6 3.0*     Lipase     Component Value Date/Time   LIPASE 36 08/08/2017 2044     Medications: . acetaminophen  1,000 mg Oral Q8H  . buprenorphine-naloxone  1 tablet Sublingual Daily  . enoxaparin (LOVENOX) injection  40 mg Subcutaneous Q24H  . feeding supplement (ENSURE ENLIVE)  237 mL Oral BID BM  . gabapentin  300 mg Oral TID  . ibuprofen  800 mg Oral TID  . lip balm  1 application Topical BID  . nicotine  21 mg Transdermal Daily  . scopolamine  1 patch Transdermal Q72H    Assessment/Plan Hx of depression GERD Tobacco abuse Hx of substance abuse on suboxone Hypokalemia - resolved, K 4.3 Mild hyponatremia - Na 133  SBO secondary to abdominal/pelvic wall hernia s/p C-section 07/25/17 S/p diagnostic laparopscopy, LOA, small bowel resection 08/09/17 Dr. Johney Maine - POD#4 - NGT removed yesterday and patient having flatus - start CLD, may advance to FLD tonight if tolerating well - removed wicks from incision and JP drain - ambulate more  FEN: Soft diet/IV fluids ID: Cefotetan pre op [id number] Follow up: Dr.  Gross/Dr. Matthew Saras  Plan: Discontinue IV medications.  I will leave her some plain Tylenol and Tylenol with codeine. She is taking ibuprofen already.    From a surgical standpoint I think she can go.  I will defer discharge timing and discharge medications to Dr. Karleen Hampshire. Follow up is in the AVS        LOS: 6 days    [name] 08/14/2017 [phone]

## 2017-08-14 NOTE — Progress Notes (Signed)
OB  Tolerating regular diet, + flatus  Vitals:   08/14/17 0622 08/14/17 0625  BP: (!) 94/56 (!) 92/57  Pulse: 73 78  Resp:    Temp:    SpO2: 99%    Resting well Abdomen soft, incisions healing well  A/P: ambulating

## 2017-08-15 NOTE — Discharge Summary (Signed)
Physician Discharge Summary  [name] FWY:[id number] DOB: Jul 05, 1975 DOA: 08/08/2017  PCP: Kathyrn Lass, MD  Admit date: 08/08/2017 Discharge date: 08/14/2017  Admitted From: Home.  Disposition:  Home.   Recommendations for Outpatient Follow-up:  1. Follow up with PCP in 1-2 weeks 2. Please obtain BMP/CBC in one week Please follow up with surgery and OB/GYN as scheduled and recommended.   Discharge Condition: stable.  CODE STATUS: FULL CODE.  Diet recommendation: Heart Healthy  Brief/Interim Summary: Laura A [name] 42 y.o.femalewith medical history significant ofdepression, GERD, tobacco abuse, substance abuse,s/p of C section on 07/25/17 (not doingbreast-feedingcurrently)who presents with nausea, vomiting, abdominal pain. She was found to have SBO, with possible incarcerated hernia. She was also found to have severe hypokalemia and admitted to medical service, and surgery consulted.  Ob/GYN consulted by surgeon and she underwent laparoscopy with lysis of adhesions, small bowel resection   on 08/09/2017.  She was started on clears and advanced to regular without any nausea, vomiting and abdominal pain.     Discharge Diagnoses:  Principal Problem:   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 08/09/2017 Active Problems:   S/P cesarean section   SBO (small bowel obstruction) (HCC)   Tobacco abuse   GERD (gastroesophageal reflux disease)   Substance abuse (HCC)   Hypokalemia   Leukocytosis   Dehiscence of fascia s/p C/S 07/25/2017  SBO, incarcerated Hernia with ischemic bowel ; S/p lap with lysis of adhesions and bowel resection. By Dr Johney Maine on 08/09/2017.  Incision site clean.  D/c NG tube and Foley catheter.  Started on clears, advanced as tolerated.  2 bm on the day of discharge.    Hypokalemia: replaced. Keep K>4, Mag >2. Replaced.  No change in meds.    Hyponatremia: mild probably from dehydration.    Tobacco abuse and substance  abuse: On suboxone sub lingual.  On nicotine patch.    S/p recent C section:  Ob gyn on board.   Anemia: Mild probably from acute anemia of blood loss. Transfuse to keep hemoglobin greater than 7. Currently hemoglobin is 9.4.    Discharge Instructions  Discharge Instructions    Diet - low sodium heart healthy   Complete by:  As directed    Discharge instructions   Complete by:  As directed    Please follow up with Surgery as recommended.     Allergies as of 08/14/2017      Reactions   Chantix [varenicline]    hives      Medication List    STOP taking these medications   cephALEXin 500 MG capsule Commonly known as:  KEFLEX   ibuprofen 800 MG tablet Commonly known as:  ADVIL,MOTRIN   lidocaine 5 % Commonly known as:  LIDODERM   oxyCODONE-acetaminophen 5-325 MG tablet Commonly known as:  PERCOCET/ROXICET   promethazine 25 MG tablet Commonly known as:  PHENERGAN     TAKE these medications   buprenorphine 8 MG Subl SL tablet Commonly known as:  SUBUTEX Place 8 mg under the tongue 2 (two) times daily.   famotidine 20 MG tablet Commonly known as:  PEPCID Take 1 tablet (20 mg total) by mouth 2 (two) times daily.   feeding supplement (ENSURE ENLIVE) Liqd Take 237 mLs by mouth 2 (two) times daily between meals.   hydrocortisone 2.5 % rectal cream Commonly known as:  ANUSOL-HC Apply 1 application topically 4 (four) times daily as needed for hemorrhoids.   hydrocortisone cream 1 % Apply 1 application topically  3 (three) times daily as needed for itching (minor skin irritation).      Follow-up Information    Molli Posey, MD Follow up.   Specialty:  Obstetrics and Gynecology Why:  Call for follow up appointment  Contact information: [place] [address] [phone]        Michael Boston, MD. Go on 09/03/2017.   Specialty:  General Surgery Why:  Follow up appointment scheduled for 4:45 PM. Please arrive 30 min prior  to appointment time. Bring photo ID and insurance information.  Contact information: [address] [place] [address] [phone]        Kathyrn Lass, MD. Schedule an appointment as soon as possible for a visit in 1 week(s).   Specialty:  Family Medicine Contact information: [place] [address] [phone]          Allergies  Allergen Reactions  . Chantix [Varenicline]     hives    Consultations:  SURGERY  OB/GYN   Procedures/Studies: Dg Abdomen 1 View  Result Date: 08/09/2017 CLINICAL DATA:  NG tube placement EXAM: ABDOMEN - 1 VIEW COMPARISON:  08/07/2017 FINDINGS: Enteric tube tip in the left upper quadrant consistent with location in the upper stomach. Persistent gas distention of left upper quadrant small bowel consistent with small bowel obstruction. Residual contrast material in the renal collecting systems and in the bladder. Postoperative changes in the lower lumbar spine and right SI joint. Surgical clips in the right upper quadrant. IMPRESSION: Enteric tube tip is in the left upper quadrant consistent with location in the upper stomach. Gaseous distention of left upper quadrant small bowel consistent with small bowel obstruction. Electronically Signed   By: Lucienne Capers M.D.   On: 08/09/2017 01:36   Ct Abdomen Pelvis W Contrast  Result Date: 08/08/2017 CLINICAL DATA:  42 year old female with acute abdominal pain and vomiting. C-section on 07/25/2017. EXAM: CT ABDOMEN AND PELVIS WITH CONTRAST TECHNIQUE: Multidetector CT imaging of the abdomen and pelvis was performed using the standard protocol following bolus administration of intravenous contrast. CONTRAST:  14mL ISOVUE-300 IOPAMIDOL (ISOVUE-300) INJECTION 61% COMPARISON:  08/07/2017 radiographs.  05/20/2007 CT FINDINGS: Lower chest: No acute abnormality. Hepatobiliary: The liver is unremarkable. Patient is status post cholecystectomy. CBD dilatation to the ampulla is noted  and measures 12 mm. Pancreas: Unremarkable Spleen: Unremarkable Adrenals/Urinary Tract: The kidneys, adrenal glands and bladder are unremarkable. Stomach/Bowel: Dilated proximal and mid small bowel loops with collapsed distal small bowel loops are compatible with a high-grade small bowel obstruction. The transition point is likely located within the anterior pelvis which there is a suggestion of anterior abdominal/pelvic wall eventration or hernia. Fluid within the eventration/hernia sac noted. There is no evidence of pneumoperitoneum.  No abscess is identified. Vascular/Lymphatic: Aortic atherosclerosis. No enlarged abdominal or pelvic lymph nodes. Reproductive: Enlarged uterus with C-section changes noted. Other: No ascites noted. Musculoskeletal: No acute or suspicious bony abnormalities noted. LOWER lumbar and RIGHT SI joint effusion hardware identified. IMPRESSION: 1. High-grade small bowel obstruction with transition point likely within an anterior abdominal/pelvic wall eventration or hernia sac. No evidence of pneumoperitoneum. 2. Enlarged postpartum uterus with C-section changes. 3. CBD dilatation which could be related to small bowel obstruction. Electronically Signed   By: Margarette Canada M.D.   On: 08/08/2017 23:26   Dg Abd 2 Views  Result Date: 08/07/2017 CLINICAL DATA:  Abdominal bloating. Vomiting. Caesarean section on 07/25/2017. EXAM: ABDOMEN - 2 VIEW COMPARISON:  CT abdomen  and pelvis 05/20/2007 FINDINGS: There is no evidence of intraperitoneal free air. There are multiple loops of dilated small bowel in the central and left abdomen measuring up to approximately 6 cm in diameter with some air-fluid levels. Stool and a small amount of gas are present in the colon. Sequelae of prior lumbar and right sacroiliac fusion are noted. Surgical clips are present in the right upper abdomen. The visualized lung bases are clear. IMPRESSION: Moderate small bowel dilatation concerning for obstruction. These results  will be called to the ordering clinician or representative by the Radiology Department at the imaging location. Electronically Signed   By: Logan Bores M.D.   On: 08/07/2017 18:12      Subjective: No nausea, vomiting, and abdominal pain.   Discharge Exam: Vitals:   08/14/17 0622 08/14/17 0625  BP: (!) 94/56 (!) 92/57  Pulse: 73 78  Resp:    Temp:    SpO2: 99%    Vitals:   08/13/17 2116 08/14/17 0620 08/14/17 0622 08/14/17 0625  BP: 103/62 (!) 84/50 (!) 94/56 (!) 92/57  Pulse: 89 72 73 78  Resp: 18     Temp: 98 F (36.7 C) 97.7 F (36.5 C)    TempSrc: Oral Oral    SpO2: 100% 100% 99%   Weight:      Height:        General: Pt is alert, awake, not in acute distress Cardiovascular: RRR, S1/S2 +, no rubs, no gallops Respiratory: CTA bilaterally, no wheezing, no rhonchi Abdominal: Soft, NT, ND, bowel sounds + Extremities: no edema, no cyanosis    The results of significant diagnostics from this hospitalization (including imaging, microbiology, ancillary and laboratory) are listed below for reference.     Microbiology: Recent Results (from the past 240 hour(s))  Urine Culture     Status: None   Collection Time: 08/09/17  1:29 AM  Result Value Ref Range Status   Specimen Description   Final    URINE, CLEAN CATCH Performed at [facility], [place] [address] [place], [place] [address]    Special Requests   Final    NONE Performed at [facility], [place] [address] [place], [place] [address]    Culture   Final    NO GROWTH Performed at [facility], [place] [address] [place], [place] [address]    Report Status 08/10/2017 FINAL  Final  Culture, blood (Routine X 2) w Reflex to ID Panel     Status: None   Collection Time: 08/09/17  2:40 AM  Result Value Ref Range Status   Specimen Description BLOOD RIGHT ARM  Final   Special Requests   Final    BOTTLES DRAWN AEROBIC AND ANAEROBIC Blood Culture adequate volume Performed at  [place] [address] [place], [place] [address]    Culture   Final    NO GROWTH 5 DAYS Performed at [facility], [place] [address], [place], [place] [address]    Report Status 08/14/2017 FINAL  Final  Culture, blood (Routine X 2) w Reflex to ID Panel     Status: None   Collection Time: 08/09/17  2:40 AM  Result Value Ref Range Status   Specimen Description   Final    BLOOD LEFT HAND Performed at [place] [address] [place], [place] [address]    Special Requests   Final    BOTTLES DRAWN AEROBIC AND ANAEROBIC Blood Culture adequate volume Performed  at [facility], [place] [address], [place], [place] [address]    Culture   Final    NO GROWTH 5 DAYS Performed at [facility], [place] [address] [place], [place] [address]    Report Status 08/14/2017 FINAL  Final  MRSA PCR Screening     Status: None   Collection Time: 08/09/17  9:50 AM  Result Value Ref Range Status   MRSA by PCR NEGATIVE NEGATIVE Final    Comment:        The GeneXpert MRSA Assay (FDA approved for NASAL specimens only), is one component of a comprehensive MRSA colonization surveillance program. It is not intended to diagnose MRSA infection nor to guide or monitor treatment for MRSA infections. Performed at [facility], [place] [address] [place], [place] [address]      Labs: BNP (last 3 results) No results for input(s): BNP in the last 8760 hours. Basic Metabolic Panel: Recent Labs  Lab 08/10/17 0440 08/11/17 0359 08/12/17 0322 08/13/17 0759 08/14/17 0719  NA 133* 135 132* 133* 137  K 3.6 4.5 4.9 4.3 4.2  CL 94* 102 105 106 110  CO2 33* 26 21* 21* 21*  GLUCOSE 132* 102* 96 115* 90  BUN 12 8 8 8 6   CREATININE 0.67 0.57 0.50 0.49 0.37*  CALCIUM 7.9* 8.0* 7.6* 7.8* 7.8*  MG 2.1 1.9 1.8 1.6* 2.0   Liver Function Tests: Recent Labs  Lab 08/08/17 2044 08/09/17 0240  AST 16 18  ALT 22 18  ALKPHOS  104 92  BILITOT 0.4 0.6  PROT 7.4 6.3*  ALBUMIN 3.6 3.0*   Recent Labs  Lab 08/08/17 2044  LIPASE 36   No results for input(s): AMMONIA in the last 168 hours. CBC: Recent Labs  Lab 08/10/17 0440 08/11/17 0359 08/12/17 0322 08/13/17 0759 08/14/17 0719  WBC 12.6* 13.0* 15.5* 11.5* 10.4  NEUTROABS 10.3* 10.0* 12.4* 8.3 7.6  HGB 11.3* 11.0* 10.7* 9.4* 9.2*  HCT 33.8* 32.9* 32.5* 28.5* 27.8*  MCV 89.4 90.6 90.3 88.5 86.6  PLT 340 352 345 343 PLATELET CLUMPS NOTED ON SMEAR, UNABLE TO ESTIMATE   Cardiac Enzymes: No results for input(s): CKTOTAL, CKMB, CKMBINDEX, TROPONINI in the last 168 hours. BNP: Invalid input(s): POCBNP CBG: No results for input(s): GLUCAP in the last 168 hours. D-Dimer No results for input(s): DDIMER in the last 72 hours. Hgb A1c No results for input(s): HGBA1C in the last 72 hours. Lipid Profile No results for input(s): CHOL, HDL, LDLCALC, TRIG, CHOLHDL, LDLDIRECT in the last 72 hours. Thyroid function studies No results for input(s): TSH, T4TOTAL, T3FREE, THYROIDAB in the last 72 hours.  Invalid input(s): FREET3 Anemia work up Recent Labs    08/13/17 0759 08/14/17 0719  VITAMINB12  --  1,220*  FOLATE  --  7.0  FERRITIN  --  157  TIBC  --  153*  IRON  --  15*  RETICCTPCT 2.4  --    Urinalysis    Component Value Date/Time   COLORURINE YELLOW 08/08/2017 2316   APPEARANCEUR CLEAR 08/08/2017 2316   LABSPEC >1.046 (H) 08/08/2017 2316   PHURINE 5.0 08/08/2017 2316   GLUCOSEU NEGATIVE 08/08/2017 2316   HGBUR LARGE (A) 08/08/2017 2316   BILIRUBINUR NEGATIVE 08/08/2017 2316   KETONESUR NEGATIVE 08/08/2017 2316   PROTEINUR NEGATIVE 08/08/2017 2316   UROBILINOGEN 0.2 09/27/2014 1057   NITRITE NEGATIVE 08/08/2017 2316   LEUKOCYTESUR NEGATIVE 08/08/2017 2316   Sepsis Labs Invalid input(s): PROCALCITONIN,  WBC,  LACTICIDVEN  Microbiology Recent Results (from the past 240 hour(s))  Urine Culture     Status: None   Collection Time: 08/09/17   1:29 AM  Result Value Ref Range Status   Specimen Description   Final    URINE, CLEAN CATCH Performed at [facility], [place] [address] [place], [place] [address]    Special Requests   Final    NONE Performed at [facility], [place] [address] [place], [place] [address]    Culture   Final    NO GROWTH Performed at [facility], [place] [address] [place], [place] [address]    Report Status 08/10/2017 FINAL  Final  Culture, blood (Routine X 2) w Reflex to ID Panel     Status: None   Collection Time: 08/09/17  2:40 AM  Result Value Ref Range Status   Specimen Description BLOOD RIGHT ARM  Final   Special Requests   Final    BOTTLES DRAWN AEROBIC AND ANAEROBIC Blood Culture adequate volume Performed at [place] [address], [place], [place] [address]    Culture   Final    NO GROWTH 5 DAYS Performed at [facility], [place] [address], [place], [place] [address]    Report Status 08/14/2017 FINAL  Final  Culture, blood (Routine X 2) w Reflex to ID Panel     Status: None   Collection Time: 08/09/17  2:40 AM  Result Value Ref Range Status   Specimen Description   Final    BLOOD LEFT HAND Performed at [place] [address] [place], [place] [address]    Special Requests   Final    BOTTLES DRAWN AEROBIC AND ANAEROBIC Blood Culture adequate volume Performed at [facility] [address] [place], [place] [address]    Culture   Final    NO GROWTH 5 DAYS Performed at [facility], [place] [address] [place], [place] [address]    Report Status 08/14/2017 FINAL  Final  MRSA PCR Screening     Status: None   Collection Time: 08/09/17  9:50 AM  Result Value Ref Range Status   MRSA by PCR NEGATIVE NEGATIVE Final    Comment:        The GeneXpert MRSA Assay (FDA approved for NASAL specimens only), is one component of a comprehensive MRSA colonization surveillance  program. It is not intended to diagnose MRSA infection nor to guide or monitor treatment for MRSA infections. Performed at [facility], [place] [address], [place], [place] [address]      Time coordinating discharge: 35 minutes  SIGNED:   Hosie Poisson, MD  [facility] 08/15/2017, 12:07 PM Pager   If 7PM-7AM, please contact night-coverage [email or web address] Password [email or web address]

## 2017-08-22 DIAGNOSIS — G894 Chronic pain syndrome: Secondary | ICD-10-CM | POA: Diagnosis not present

## 2017-08-22 DIAGNOSIS — M545 Low back pain: Secondary | ICD-10-CM | POA: Diagnosis not present

## 2017-09-04 ENCOUNTER — Other Ambulatory Visit (HOSPITAL_COMMUNITY): Payer: Self-pay | Admitting: *Deleted

## 2017-09-05 ENCOUNTER — Inpatient Hospital Stay (HOSPITAL_COMMUNITY)
Admission: RE | Admit: 2017-09-05 | Discharge: 2017-09-05 | Disposition: A | Discharge status: Home / Self Care | Payer: 59 | Source: Ambulatory Visit | Attending: Obstetrics and Gynecology | Admitting: Obstetrics and Gynecology

## 2017-09-07 ENCOUNTER — Encounter (HOSPITAL_COMMUNITY): Payer: 59

## 2017-09-18 DIAGNOSIS — G894 Chronic pain syndrome: Secondary | ICD-10-CM | POA: Diagnosis not present

## 2017-10-16 DIAGNOSIS — G894 Chronic pain syndrome: Secondary | ICD-10-CM | POA: Diagnosis not present

## 2017-10-30 DIAGNOSIS — D509 Iron deficiency anemia, unspecified: Secondary | ICD-10-CM | POA: Diagnosis not present

## 2017-10-30 DIAGNOSIS — N939 Abnormal uterine and vaginal bleeding, unspecified: Secondary | ICD-10-CM | POA: Diagnosis not present

## 2017-11-02 DIAGNOSIS — M545 Low back pain: Secondary | ICD-10-CM | POA: Diagnosis not present

## 2017-11-02 DIAGNOSIS — M4722 Other spondylosis with radiculopathy, cervical region: Secondary | ICD-10-CM | POA: Diagnosis not present

## 2017-11-07 DIAGNOSIS — G894 Chronic pain syndrome: Secondary | ICD-10-CM | POA: Diagnosis not present

## 2017-11-10 DIAGNOSIS — M545 Low back pain: Secondary | ICD-10-CM | POA: Diagnosis not present

## 2017-11-12 DIAGNOSIS — M545 Low back pain: Secondary | ICD-10-CM | POA: Diagnosis not present

## 2017-11-19 DIAGNOSIS — R102 Pelvic and perineal pain: Secondary | ICD-10-CM | POA: Diagnosis not present

## 2017-11-19 DIAGNOSIS — N939 Abnormal uterine and vaginal bleeding, unspecified: Secondary | ICD-10-CM | POA: Diagnosis not present

## 2017-11-21 DIAGNOSIS — Z30013 Encounter for initial prescription of injectable contraceptive: Secondary | ICD-10-CM | POA: Diagnosis not present

## 2017-12-03 DIAGNOSIS — N939 Abnormal uterine and vaginal bleeding, unspecified: Secondary | ICD-10-CM | POA: Diagnosis not present

## 2017-12-07 ENCOUNTER — Encounter: Payer: Self-pay | Admitting: Family Medicine

## 2017-12-07 ENCOUNTER — Ambulatory Visit: Payer: 59 | Admitting: Family Medicine

## 2017-12-07 VITALS — BP 110/70 | HR 72 | Ht 64.0 in | Wt 140.4 lb

## 2017-12-07 DIAGNOSIS — Z7689 Persons encountering health services in other specified circumstances: Secondary | ICD-10-CM

## 2017-12-07 DIAGNOSIS — L659 Nonscarring hair loss, unspecified: Secondary | ICD-10-CM

## 2017-12-07 DIAGNOSIS — F329 Major depressive disorder, single episode, unspecified: Secondary | ICD-10-CM | POA: Diagnosis not present

## 2017-12-07 DIAGNOSIS — N938 Other specified abnormal uterine and vaginal bleeding: Secondary | ICD-10-CM

## 2017-12-07 DIAGNOSIS — F32A Depression, unspecified: Secondary | ICD-10-CM

## 2017-12-07 NOTE — Progress Notes (Signed)
Subjective:    Patient ID: [name], female    DOB: May 16, 1975, 42 y.o.   MRN: [id number]  HPI Chief Complaint  Patient presents with  . new pt    new pt, just had csection- and then had bowel obstruction and had surgery, hair falling out.    She is new to the practice and here for an acute complaint of hair loss. Her with her 16 month old child. She is not breastfeeding.  Previous PCP Dr. Sabra Heck at [facility].   Complains of hair loss for the past 3 weeks. States it is coming out in clumps. She has been seen by her OB/GYN for this issue and states she had labs checked that were all normal including thyroid, iron level and others. States she has an appointment next week with dermatology for this. Questions whether anything else can be done.  Started on vitamins for hair and nails last week.  States she also has acne and denies ever having this before.   Started on Depo Provera for DUB. States she was told she needs a hysterectomy. She is hesitant about this.   Chronic back pain. Under the care of ortho and has an upcoming neurology appointment.   Denies fever, chills, dizziness, chest pain, palpitations, shortness of breath, abdominal pain, N/V/D, urinary symptoms.    Current other providers:  OB/GYN- Molli Posey at [facility] orthopedists- Dr. Douglass Rivers neurologist-Dr. Krista Blue for back pain  Dermatologist- Dr. Nevada Crane November    Reports history of depression and is not currently seeing a therapist.  History of substance abuse. Denies using now.  States she is taking Suboxone 8 mg twice daily- prescribed by Dr. Edgar Frisk at [facility] in [place] 40 mg for depression.   Smokes some days. 3-4 cigarettes per day.  Denies alcohol use.   Denies SI or HI.   She has 3 children. Ages 62, 2 and 4 months Married. Works at [employer].  Her mother and nephew live in [place]. No other relatives.   Reviewed allergies, medications,  past medical, surgical, and social history.   Review of Systems Pertinent positives and negatives in the history of present illness.     Objective:   Physical Exam  Constitutional: She is oriented to person, place, and time. She appears well-developed and well-nourished. She does not have a sickly appearance.  HENT:  Head: Atraumatic. Hair is abnormal.  Nose: Nose normal.  Mouth/Throat: Oropharynx is clear and moist.  Thinning areas but no large areas of hair loss. No broken hairs. No sign of inflammation or dry patches.   Neck: Full passive range of motion without pain. Neck supple.  Cardiovascular: Normal rate.  Pulmonary/Chest: Effort normal.  Lymphadenopathy:    She has no cervical adenopathy.  Neurological: She is alert and oriented to person, place, and time. No cranial nerve deficit or sensory deficit.  Skin: Skin is warm and dry. Capillary refill takes less than 2 seconds. No rash noted. No pallor.  Psychiatric: She has a normal mood and affect. Her speech is normal and behavior is normal.   BP 110/70   Pulse 72   Ht 5\' 4"  (1.626 m)   Wt 140 lb 6.4 oz (63.7 kg)   LMP 12/05/2017   Breastfeeding? No   BMI 24.10 kg/m       Assessment & Plan:  Hair loss  DUB (dysfunctional uterine bleeding)  Encounter to establish care  Depression, unspecified depression type  Discussed that I will request lab results and office notes from Dr. Matthew Saras who has been seeing her for hair loss and DUB. She reports all labs were normal and no anemia or thyroid dysfunction.  No sign of an acute infectious process. No acute distress.  I agree that seeing the dermatologist as scheduled next week would be helpful.  Recommend counseling for depression and stress of recent illnesses and surgeries. A list of counselors will be provided.  Follow up as needed.

## 2017-12-07 NOTE — Patient Instructions (Addendum)
You can call to schedule your appointment with the counselor. A few offices are listed below for you to call.    [place] for a psychiatrist  [address] [facility] [address] [place], [place] [address] [facility]  [address], [place], [place], [place] [address]  Phone: [phone]   [place] [address] [place], [place] [address]  Phone: [phone]

## 2017-12-12 ENCOUNTER — Encounter: Payer: Self-pay | Admitting: Family Medicine

## 2017-12-12 DIAGNOSIS — L700 Acne vulgaris: Secondary | ICD-10-CM | POA: Diagnosis not present

## 2017-12-12 DIAGNOSIS — L650 Telogen effluvium: Secondary | ICD-10-CM | POA: Diagnosis not present

## 2017-12-13 ENCOUNTER — Telehealth: Payer: Self-pay | Admitting: Family Medicine

## 2017-12-13 NOTE — Telephone Encounter (Signed)
Received a form from pt's employer along with a medical records request. Please complete form and return to Juliann Pulse to send with medical records. Sending form back to be completed.

## 2017-12-13 NOTE — Telephone Encounter (Signed)
I think you are working on this? Thanks.

## 2017-12-14 ENCOUNTER — Telehealth: Payer: Self-pay | Admitting: Family Medicine

## 2017-12-14 NOTE — Telephone Encounter (Signed)
[name] with [employer] [phone]  Called to check status of forms that were sent over

## 2017-12-17 NOTE — Telephone Encounter (Signed)
Send back documentation stated we did not take her out of work.

## 2017-12-19 DIAGNOSIS — M545 Low back pain: Secondary | ICD-10-CM | POA: Diagnosis not present

## 2017-12-19 DIAGNOSIS — G894 Chronic pain syndrome: Secondary | ICD-10-CM | POA: Diagnosis not present

## 2017-12-25 DIAGNOSIS — R102 Pelvic and perineal pain: Secondary | ICD-10-CM | POA: Diagnosis not present

## 2017-12-26 NOTE — H&P (Signed)
EILENE VOIGT  DICTATION # [id number] CSN# [id number]   Margarette Asal, MD 12/26/2017 2:19 PM

## 2017-12-27 NOTE — H&P (Signed)
NAME: [name], [name] MEDICAL RECORD [id number] ACCOUNT [id number] DATE OF BIRTH:01-29-1976 FACILITY: [facility] LOCATION: [facility] PHYSICIAN:Dewey Neukam Garry Heater, MD  HISTORY AND PHYSICAL  DATE OF ADMISSION:  01/01/2018  CHIEF COMPLAINT:  Menorrhagia, severe pelvic pain.  HISTORY OF PRESENT ILLNESS:  A 42 year old G5, P3, 0-2-3, who underwent repeat cesarean section.  07/25/2017.  Findings were normal at that time.  She experienced a postoperative small-bowel obstruction, required initially laparoscopy followed by a  Pfannenstiel laparotomy with small bowel resection for incarcerated hernia.  Recovered from that completely, but continued to experience menorrhagia and midline, severe pelvic cramping pain.  She is currently on the Depo-Provera to improve the bleeding,  but her pain continues to be substantial.  She has a history of opioid abuse, is currently on detox with Suboxone.  Ultrasound dated 11/19/2017 in our office showed possibility of some synechiae within the cavity.  No other significant adnexal  abnormalities.  She has not been able to get good pain relief with nonsteroidal medications.  Presents at this time for definitive TAH, bilateral salpingectomy.  This procedure including the specific risks regarding bleeding, infection, adjacent organ  injury, wound infection, phlebitis, concerns regarding adhesions and surgical management of adhesive disease along with her expected recovery time all reviewed.  Most recently in the office, received IM Toradol and has been on meloxicam and Protonix for  pain management.  PAST MEDICAL HISTORY:  ALLERGIES:  None.  OBSTETRICAL HISTORY:  One vaginal delivery 2008.  She had a cesarean section in 2017 for IUGR repeat C-section in 2019.  Other surgery, laparotomy for small-bowel obstruction and resection.  This year she has also had lumbar fusion from back injury.   Laparoscopic cholecystectomy and breast augmentation.  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    SOCIAL HISTORY:  She states 1 pack per week.  She is followed by [facility] for her Subutex therapy for her opioid addiction.  PHYSICAL EXAMINATION: VITAL SIGNS:  Temperature 98.2, blood pressure 120/84. HEENT:  Unremarkable. NECK:  Supple, without masses. LUNGS:  Clear. HEART:  Regular rate and rhythm without murmurs, rubs or gallops. BREASTS:  Exam revealed implants.  No masses. ABDOMEN:  Soft, flat, nontender.  Well-healed Pfannenstiel scar.   PELVIC:  Vulva, vagina, cervix normal.  Uterus mid to anterior upper limit of normal size, slightly tender.  No nodularity.  Adnexa negative. EXTREMITIES AND NEUROLOGIC:  Unremarkable.  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  PLAN: TAH, bilateral salpingectomy.  Procedure and risks reviewed as above.  AN/NUANCE  D:12/26/2017 T:12/26/2017 JOB:[id number]

## 2017-12-28 ENCOUNTER — Encounter (HOSPITAL_COMMUNITY)
Admission: RE | Admit: 2017-12-28 | Discharge: 2017-12-28 | Disposition: A | Discharge status: Home / Self Care | Payer: 59 | Source: Ambulatory Visit | Attending: Obstetrics and Gynecology | Admitting: Obstetrics and Gynecology

## 2017-12-28 ENCOUNTER — Other Ambulatory Visit: Payer: Self-pay

## 2017-12-28 ENCOUNTER — Encounter (HOSPITAL_COMMUNITY): Payer: Self-pay

## 2017-12-28 DIAGNOSIS — Z01812 Encounter for preprocedural laboratory examination: Secondary | ICD-10-CM | POA: Diagnosis not present

## 2017-12-28 LAB — CBC
HCT: 39 % (ref 36.0–46.0)
HEMOGLOBIN: 12.8 g/dL (ref 12.0–15.0)
MCH: 28.7 pg (ref 26.0–34.0)
MCHC: 32.8 g/dL (ref 30.0–36.0)
MCV: 87.4 fL (ref 80.0–100.0)
NRBC: 0 % (ref 0.0–0.2)
Platelets: 268 10*3/uL (ref 150–400)
RBC: 4.46 MIL/uL (ref 3.87–5.11)
RDW: 15.3 % (ref 11.5–15.5)
WBC: 11.2 10*3/uL — AB (ref 4.0–10.5)

## 2017-12-28 NOTE — Progress Notes (Signed)
08-08-17 (Epic) EKG, CT Abd

## 2017-12-28 NOTE — Patient Instructions (Addendum)
[name]  12/28/2017     Your procedure is scheduled on 01-01-18   Report to [place]  at 5:30 A.M.  Call this number if you have problems the morning of [phone]  OUR ADDRESS IS [address], WE ARE LOCATED [facility] WITH [facility].   Remember:  Do not eat food or drink liquids after midnight.  Take these medicines the morning of surgery with A SIP OF WATER: None   Do not wear jewelry, make-up or nail polish.  Do not wear lotions, powders, or perfumes, or deoderant.  Do not shave 48 hours prior to surgery.  Men may shave face and neck.  Do not bring valuables to the hospital.  [facility] is not responsible for any belongings or valuables.  Contacts, dentures or bridgework may not be worn into surgery.  Leave your suitcase in the car.  After surgery it may be brought to your room.  For patients admitted to the hospital, discharge time will be determined by your treatment team.  Patients discharged the day of surgery will not be allowed to drive home.   Special instructions:  Cough and Deep Breath Exercises. Bring your medication in their original pill bottle. Please read over the following fact sheets that you were given:       [facility] - Preparing for Surgery Before surgery, you can play an important role.  Because skin is not sterile, your skin needs to be as free of germs as possible.  You can reduce the number of germs on your skin by washing with CHG (chlorahexidine gluconate) soap before surgery.  CHG is an antiseptic cleaner which kills germs and bonds with the skin to continue killing germs even after washing. Please DO NOT use if you have an allergy to CHG or antibacterial soaps.  If your skin becomes reddened/irritated stop using the CHG and inform your nurse when you arrive at Short Stay. Do not shave (including legs and underarms) for at least 48 hours prior to the first CHG shower.  You may shave  your face/neck. Please follow these instructions carefully:  1.  Shower with CHG Soap the night before surgery and the  morning of Surgery.  2.  If you choose to wash your hair, wash your hair first as usual with your  normal  shampoo.  3.  After you shampoo, rinse your hair and body thoroughly to remove the  shampoo.                           4.  Use CHG as you would any other liquid soap.  You can apply chg directly  to the skin and wash                       Gently with a scrungie or clean washcloth.  5.  Apply the CHG Soap to your body ONLY FROM THE NECK DOWN.   Do not use on face/ open                           Wound or open sores. Avoid contact with eyes, ears mouth and genitals (private parts).                       Wash face,  Genitals (private parts) with your normal soap.  6.  Wash thoroughly, paying special attention to the area where your surgery  will be performed.  7.  Thoroughly rinse your body with warm water from the neck down.  8.  DO NOT shower/wash with your normal soap after using and rinsing off  the CHG Soap.                9.  Pat yourself dry with a clean towel.            10.  Wear clean pajamas.            11.  Place clean sheets on your bed the night of your first shower and do not  sleep with pets. Day of Surgery : Do not apply any lotions/deodorants the morning of surgery.  Please wear clean clothes to the hospital/surgery center.  FAILURE TO FOLLOW THESE INSTRUCTIONS MAY RESULT IN THE CANCELLATION OF YOUR SURGERY PATIENT SIGNATURE_________________________________  NURSE SIGNATURE__________________________________  ________________________________________________________________________

## 2018-01-01 ENCOUNTER — Inpatient Hospital Stay (HOSPITAL_BASED_OUTPATIENT_CLINIC_OR_DEPARTMENT_OTHER): Payer: 59 | Admitting: Anesthesiology

## 2018-01-01 ENCOUNTER — Encounter (HOSPITAL_BASED_OUTPATIENT_CLINIC_OR_DEPARTMENT_OTHER): Payer: Self-pay

## 2018-01-01 ENCOUNTER — Encounter (HOSPITAL_COMMUNITY)
Admission: AD | Disposition: A | Discharge status: Home / Self Care | Payer: Self-pay | Source: Ambulatory Visit | Attending: Obstetrics and Gynecology

## 2018-01-01 ENCOUNTER — Other Ambulatory Visit: Payer: Self-pay

## 2018-01-01 ENCOUNTER — Inpatient Hospital Stay (HOSPITAL_COMMUNITY)
Admission: AD | Admit: 2018-01-01 | Discharge: 2018-01-02 | DRG: 742 | Disposition: A | Discharge status: Home / Self Care | Payer: 59 | Source: Ambulatory Visit | Attending: Obstetrics and Gynecology | Admitting: Obstetrics and Gynecology

## 2018-01-01 DIAGNOSIS — K219 Gastro-esophageal reflux disease without esophagitis: Secondary | ICD-10-CM | POA: Diagnosis present

## 2018-01-01 DIAGNOSIS — R102 Pelvic and perineal pain: Secondary | ICD-10-CM | POA: Diagnosis not present

## 2018-01-01 DIAGNOSIS — Z79891 Long term (current) use of opiate analgesic: Secondary | ICD-10-CM | POA: Diagnosis not present

## 2018-01-01 DIAGNOSIS — N99518 Other cystostomy complication: Secondary | ICD-10-CM | POA: Diagnosis not present

## 2018-01-01 DIAGNOSIS — Z87891 Personal history of nicotine dependence: Secondary | ICD-10-CM | POA: Diagnosis not present

## 2018-01-01 DIAGNOSIS — E876 Hypokalemia: Secondary | ICD-10-CM | POA: Diagnosis not present

## 2018-01-01 DIAGNOSIS — G8929 Other chronic pain: Secondary | ICD-10-CM | POA: Diagnosis present

## 2018-01-01 DIAGNOSIS — D259 Leiomyoma of uterus, unspecified: Secondary | ICD-10-CM | POA: Diagnosis not present

## 2018-01-01 DIAGNOSIS — N736 Female pelvic peritoneal adhesions (postinfective): Secondary | ICD-10-CM | POA: Diagnosis present

## 2018-01-01 DIAGNOSIS — N920 Excessive and frequent menstruation with regular cycle: Secondary | ICD-10-CM | POA: Diagnosis not present

## 2018-01-01 DIAGNOSIS — N938 Other specified abnormal uterine and vaginal bleeding: Secondary | ICD-10-CM | POA: Diagnosis not present

## 2018-01-01 HISTORY — PX: ABDOMINAL HYSTERECTOMY: SHX81

## 2018-01-01 LAB — TYPE AND SCREEN
ABO/RH(D): B POS
ANTIBODY SCREEN: NEGATIVE

## 2018-01-01 LAB — POCT PREGNANCY, URINE: Preg Test, Ur: NEGATIVE

## 2018-01-01 SURGERY — HYSTERECTOMY, ABDOMINAL
Anesthesia: General | Site: Abdomen | Laterality: Bilateral

## 2018-01-01 MED ORDER — SUGAMMADEX SODIUM 200 MG/2ML IV SOLN
INTRAVENOUS | Status: AC
  Filled 2018-01-01: qty 2

## 2018-01-01 MED ORDER — KETOROLAC TROMETHAMINE 30 MG/ML IJ SOLN
30.0000 mg | Freq: Four times a day (QID) | INTRAMUSCULAR | Status: DC
  Administered 2018-01-01 – 2018-01-02 (×4): 30 mg via INTRAVENOUS
  Filled 2018-01-01 (×4): qty 1

## 2018-01-01 MED ORDER — OXYCODONE HCL 5 MG/5ML PO SOLN
5.0000 mg | Freq: Once | ORAL | Status: DC | PRN
  Filled 2018-01-01: qty 5

## 2018-01-01 MED ORDER — DIPHENHYDRAMINE HCL 12.5 MG/5ML PO ELIX: 12.5000 mg | ORAL_SOLUTION | Freq: Four times a day (QID) | ORAL | Status: DC | PRN

## 2018-01-01 MED ORDER — BUPRENORPHINE HCL-NALOXONE HCL 8-2 MG SL SUBL
1.0000 | SUBLINGUAL_TABLET | Freq: Three times a day (TID) | SUBLINGUAL | Status: DC
  Administered 2018-01-01 – 2018-01-02 (×5): 1 via SUBLINGUAL
  Filled 2018-01-01 (×5): qty 1

## 2018-01-01 MED ORDER — DEXAMETHASONE SODIUM PHOSPHATE 10 MG/ML IJ SOLN
INTRAMUSCULAR | Status: AC
  Filled 2018-01-01: qty 1

## 2018-01-01 MED ORDER — DEXAMETHASONE SODIUM PHOSPHATE 10 MG/ML IJ SOLN
INTRAMUSCULAR | Status: DC | PRN
  Administered 2018-01-01: 10 mg via INTRAVENOUS

## 2018-01-01 MED ORDER — FENTANYL CITRATE (PF) 250 MCG/5ML IJ SOLN
INTRAMUSCULAR | Status: AC
  Filled 2018-01-01: qty 5

## 2018-01-01 MED ORDER — IBUPROFEN 800 MG PO TABS: 800.0000 mg | ORAL_TABLET | Freq: Three times a day (TID) | ORAL | Status: DC | PRN

## 2018-01-01 MED ORDER — SUGAMMADEX SODIUM 200 MG/2ML IV SOLN
INTRAVENOUS | Status: DC | PRN
  Administered 2018-01-01: 200 mg via INTRAVENOUS

## 2018-01-01 MED ORDER — ACETAMINOPHEN 10 MG/ML IV SOLN
INTRAVENOUS | Status: DC | PRN
  Administered 2018-01-01: 1000 mg via INTRAVENOUS

## 2018-01-01 MED ORDER — HYDROMORPHONE HCL 1 MG/ML IJ SOLN
INTRAMUSCULAR | Status: AC
  Filled 2018-01-01: qty 1

## 2018-01-01 MED ORDER — ACETAMINOPHEN 10 MG/ML IV SOLN
INTRAVENOUS | Status: AC
  Filled 2018-01-01: qty 100

## 2018-01-01 MED ORDER — LACTATED RINGERS IV SOLN
INTRAVENOUS | Status: DC
  Administered 2018-01-01: 1000 mL via INTRAVENOUS
  Administered 2018-01-01: 08:00:00 via INTRAVENOUS
  Filled 2018-01-01: qty 1000

## 2018-01-01 MED ORDER — SODIUM CHLORIDE (PF) 0.9 % IJ SOLN
INTRAMUSCULAR | Status: DC | PRN
  Administered 2018-01-01: 20 mL

## 2018-01-01 MED ORDER — SODIUM CHLORIDE 0.9 % IR SOLN
Status: DC | PRN
  Administered 2018-01-01: 1000 mL
  Administered 2018-01-01: 500 mL

## 2018-01-01 MED ORDER — GABAPENTIN 300 MG PO CAPS
300.0000 mg | ORAL_CAPSULE | Freq: Two times a day (BID) | ORAL | Status: DC
  Administered 2018-01-01 – 2018-01-02 (×3): 300 mg via ORAL
  Filled 2018-01-01 (×3): qty 1

## 2018-01-01 MED ORDER — LIDOCAINE 2% (20 MG/ML) 5 ML SYRINGE
INTRAMUSCULAR | Status: DC | PRN
  Administered 2018-01-01: 80 mg via INTRAVENOUS

## 2018-01-01 MED ORDER — KETOROLAC TROMETHAMINE 30 MG/ML IJ SOLN
INTRAMUSCULAR | Status: AC
  Filled 2018-01-01: qty 1

## 2018-01-01 MED ORDER — KETOROLAC TROMETHAMINE 30 MG/ML IJ SOLN
INTRAMUSCULAR | Status: DC | PRN
  Administered 2018-01-01: 30 mg via INTRAVENOUS

## 2018-01-01 MED ORDER — PROPOFOL 10 MG/ML IV BOLUS
INTRAVENOUS | Status: AC
  Filled 2018-01-01: qty 40

## 2018-01-01 MED ORDER — ONDANSETRON HCL 4 MG/2ML IJ SOLN
INTRAMUSCULAR | Status: AC
  Filled 2018-01-01: qty 2

## 2018-01-01 MED ORDER — ROCURONIUM BROMIDE 100 MG/10ML IV SOLN
INTRAVENOUS | Status: AC
  Filled 2018-01-01: qty 1

## 2018-01-01 MED ORDER — ONDANSETRON HCL 4 MG/2ML IJ SOLN
INTRAMUSCULAR | Status: DC | PRN
  Administered 2018-01-01 (×2): 4 mg via INTRAVENOUS

## 2018-01-01 MED ORDER — SODIUM CHLORIDE 0.9 % IV SOLN
2.0000 g | INTRAVENOUS | Status: AC
  Administered 2018-01-01: 2 g via INTRAVENOUS
  Filled 2018-01-01: qty 2

## 2018-01-01 MED ORDER — PROMETHAZINE HCL 25 MG/ML IJ SOLN
6.2500 mg | INTRAMUSCULAR | Status: DC | PRN
  Filled 2018-01-01: qty 1

## 2018-01-01 MED ORDER — SODIUM CHLORIDE 0.9 % IV SOLN
INTRAVENOUS | Status: AC
  Filled 2018-01-01: qty 2

## 2018-01-01 MED ORDER — NALOXONE HCL 0.4 MG/ML IJ SOLN: 0.4000 mg | INTRAMUSCULAR | Status: DC | PRN

## 2018-01-01 MED ORDER — BUPRENORPHINE HCL-NALOXONE HCL 8-2 MG SL FILM: 1.0000 | ORAL_FILM | Freq: Three times a day (TID) | SUBLINGUAL | Status: DC

## 2018-01-01 MED ORDER — ONDANSETRON HCL 4 MG/2ML IJ SOLN: 4.0000 mg | Freq: Four times a day (QID) | INTRAMUSCULAR | Status: DC | PRN

## 2018-01-01 MED ORDER — MORPHINE SULFATE 2 MG/ML IV SOLN
INTRAVENOUS | Status: DC
  Administered 2018-01-01: 5 mg via INTRAVENOUS
  Administered 2018-01-01: 2 mg via INTRAVENOUS
  Administered 2018-01-01 (×2): 3 mg via INTRAVENOUS
  Administered 2018-01-02: 2 mg via INTRAVENOUS
  Filled 2018-01-01: qty 30

## 2018-01-01 MED ORDER — MEPERIDINE HCL 25 MG/ML IJ SOLN
6.2500 mg | INTRAMUSCULAR | Status: DC | PRN
  Filled 2018-01-01: qty 1

## 2018-01-01 MED ORDER — FENTANYL CITRATE (PF) 100 MCG/2ML IJ SOLN
INTRAMUSCULAR | Status: DC | PRN
  Administered 2018-01-01 (×5): 50 ug via INTRAVENOUS

## 2018-01-01 MED ORDER — KETOROLAC TROMETHAMINE 30 MG/ML IJ SOLN: 30.0000 mg | Freq: Four times a day (QID) | INTRAMUSCULAR | Status: DC

## 2018-01-01 MED ORDER — MIDAZOLAM HCL 2 MG/2ML IJ SOLN
INTRAMUSCULAR | Status: AC
  Filled 2018-01-01: qty 2

## 2018-01-01 MED ORDER — MIDAZOLAM HCL 2 MG/2ML IJ SOLN
INTRAMUSCULAR | Status: DC | PRN
  Administered 2018-01-01: 2 mg via INTRAVENOUS

## 2018-01-01 MED ORDER — PROPOFOL 10 MG/ML IV BOLUS
INTRAVENOUS | Status: DC | PRN
  Administered 2018-01-01: 150 mg via INTRAVENOUS
  Administered 2018-01-01: 50 mg via INTRAVENOUS

## 2018-01-01 MED ORDER — BUPIVACAINE LIPOSOME 1.3 % IJ SUSP
INTRAMUSCULAR | Status: DC | PRN
  Administered 2018-01-01: 20 mL

## 2018-01-01 MED ORDER — BUTORPHANOL TARTRATE 1 MG/ML IJ SOLN
1.0000 mg | INTRAMUSCULAR | Status: DC | PRN
  Filled 2018-01-01: qty 2

## 2018-01-01 MED ORDER — VILAZODONE HCL 40 MG PO TABS
40.0000 mg | ORAL_TABLET | Freq: Every day | ORAL | Status: DC
  Filled 2018-01-01: qty 1

## 2018-01-01 MED ORDER — OXYCODONE HCL 5 MG PO TABS
5.0000 mg | ORAL_TABLET | Freq: Once | ORAL | Status: DC | PRN
  Filled 2018-01-01: qty 1

## 2018-01-01 MED ORDER — ARTIFICIAL TEARS OPHTHALMIC OINT
TOPICAL_OINTMENT | OPHTHALMIC | Status: AC
  Filled 2018-01-01: qty 3.5

## 2018-01-01 MED ORDER — MENTHOL 3 MG MT LOZG: 1.0000 | LOZENGE | OROMUCOSAL | Status: DC | PRN

## 2018-01-01 MED ORDER — ONDANSETRON HCL 4 MG PO TABS: 4.0000 mg | ORAL_TABLET | Freq: Four times a day (QID) | ORAL | Status: DC | PRN

## 2018-01-01 MED ORDER — SODIUM CHLORIDE 0.9% FLUSH: 9.0000 mL | INTRAVENOUS | Status: DC | PRN

## 2018-01-01 MED ORDER — LIDOCAINE 2% (20 MG/ML) 5 ML SYRINGE
INTRAMUSCULAR | Status: AC
  Filled 2018-01-01: qty 5

## 2018-01-01 MED ORDER — BUPIVACAINE HCL 0.5 % IJ SOLN
INTRAMUSCULAR | Status: DC | PRN
  Administered 2018-01-01: 20 mL

## 2018-01-01 MED ORDER — HYDROMORPHONE HCL 1 MG/ML IJ SOLN
0.2500 mg | INTRAMUSCULAR | Status: DC | PRN
  Administered 2018-01-01: 0.5 mg via INTRAVENOUS
  Filled 2018-01-01: qty 0.5

## 2018-01-01 MED ORDER — ROCURONIUM BROMIDE 10 MG/ML (PF) SYRINGE
PREFILLED_SYRINGE | INTRAVENOUS | Status: DC | PRN
  Administered 2018-01-01: 50 mg via INTRAVENOUS

## 2018-01-01 MED ORDER — DIPHENHYDRAMINE HCL 50 MG/ML IJ SOLN: 12.5000 mg | Freq: Four times a day (QID) | INTRAMUSCULAR | Status: DC | PRN

## 2018-01-01 MED ORDER — DEXTROSE IN LACTATED RINGERS 5 % IV SOLN
INTRAVENOUS | Status: DC
  Administered 2018-01-01 (×2): via INTRAVENOUS

## 2018-01-01 SURGICAL SUPPLY — 50 items
BARRIER ADHS 3X4 INTERCEED (GAUZE/BANDAGES/DRESSINGS) IMPLANT
BLADE EXTENDED COATED 6.5IN (ELECTRODE) IMPLANT
BLADE HEX COATED 2.75 (ELECTRODE) ×3 IMPLANT
CANISTER SUCT 3000ML PPV (MISCELLANEOUS) ×3 IMPLANT
CLOSURE WOUND 1/4X4 (GAUZE/BANDAGES/DRESSINGS)
COVER WAND RF STERILE (DRAPES) ×3 IMPLANT
DECANTER SPIKE VIAL GLASS SM (MISCELLANEOUS) IMPLANT
DRAPE WARM FLUID 44X44 (DRAPE) ×3 IMPLANT
DRSG OPSITE POSTOP 4X10 (GAUZE/BANDAGES/DRESSINGS) ×2 IMPLANT
DURAPREP 26ML APPLICATOR (WOUND CARE) ×3 IMPLANT
GLOVE BIO SURGEON STRL SZ7 (GLOVE) ×6 IMPLANT
GLOVE BIO SURGEON STRL SZ8 (GLOVE) ×2 IMPLANT
GLOVE BIOGEL PI IND STRL 8.5 (GLOVE) IMPLANT
GLOVE BIOGEL PI INDICATOR 8.5 (GLOVE) ×2
GOWN STRL REUS W/ TWL XL LVL3 (GOWN DISPOSABLE) ×3 IMPLANT
GOWN STRL REUS W/TWL XL LVL3 (GOWN DISPOSABLE) ×6
HOLDER FOLEY CATH W/STRAP (MISCELLANEOUS) ×3 IMPLANT
KIT TURNOVER CYSTO (KITS) ×3 IMPLANT
MANIPULATOR UTERINE 4.5 ZUMI (MISCELLANEOUS) IMPLANT
NDL HYPO 18GX1.5 BLUNT FILL (NEEDLE) IMPLANT
NDL SPNL 22GX3.5 QUINCKE BK (NEEDLE) ×1 IMPLANT
NEEDLE HYPO 18GX1.5 BLUNT FILL (NEEDLE) IMPLANT
NEEDLE HYPO 22GX1.5 SAFETY (NEEDLE) ×3 IMPLANT
NEEDLE SPNL 22GX3.5 QUINCKE BK (NEEDLE) ×3 IMPLANT
NS IRRIG 1000ML POUR BTL (IV SOLUTION) ×2 IMPLANT
NS IRRIG 500ML POUR BTL (IV SOLUTION) ×4 IMPLANT
PACK ABDOMINAL GYN (CUSTOM PROCEDURE TRAY) ×3 IMPLANT
PAD OB MATERNITY 4.3X12.25 (PERSONAL CARE ITEMS) ×3 IMPLANT
PORT LAP GEL ALEXIS MED 5-9CM (MISCELLANEOUS) ×2 IMPLANT
SPONGE LAP 4X18 RFD (DISPOSABLE) ×3 IMPLANT
STAPLER VISISTAT 35W (STAPLE) IMPLANT
STRIP CLOSURE SKIN 1/4X4 (GAUZE/BANDAGES/DRESSINGS) IMPLANT
SUT MNCRL AB 4-0 PS2 18 (SUTURE) IMPLANT
SUT PDS AB 0 CT 36 (SUTURE) ×2 IMPLANT
SUT PDS AB 0 CT1 27 (SUTURE) IMPLANT
SUT VIC AB 0 CT1 18XCR BRD8 (SUTURE) IMPLANT
SUT VIC AB 0 CT1 27 (SUTURE) ×2
SUT VIC AB 0 CT1 27XBRD ANBCTR (SUTURE) IMPLANT
SUT VIC AB 0 CT1 8-18 (SUTURE) ×6
SUT VIC AB 2-0 CT1 (SUTURE) IMPLANT
SUT VIC AB 2-0 CT1 27 (SUTURE)
SUT VIC AB 2-0 CT1 TAPERPNT 27 (SUTURE) IMPLANT
SUT VIC AB 3-0 CT1 27 (SUTURE)
SUT VIC AB 3-0 CT1 TAPERPNT 27 (SUTURE) IMPLANT
SUT VICRYL 0 TIES 12 18 (SUTURE) ×3 IMPLANT
SYR 10ML LL (SYRINGE) ×6 IMPLANT
SYR CONTROL 10ML LL (SYRINGE) ×5 IMPLANT
SYRINGE 60CC LL (MISCELLANEOUS) ×2 IMPLANT
TOWEL OR 17X24 6PK STRL BLUE (TOWEL DISPOSABLE) ×6 IMPLANT
WATER STERILE IRR 500ML POUR (IV SOLUTION) ×1 IMPLANT

## 2018-01-01 NOTE — Plan of Care (Signed)
Patient received from PACU post TAH w/ salpingectomy; lethargic from sedation and medication but able to follow commands. IV right hand, low abdominal honeycomb dressing; SCDs in place. Will continue to monitor.

## 2018-01-01 NOTE — Progress Notes (Signed)
The patient was re-examined with no change in status 

## 2018-01-01 NOTE — Anesthesia Postprocedure Evaluation (Signed)
Anesthesia Post Note  Patient: [name]  Procedure(s) Performed: HYSTERECTOMY ABDOMINAL, salpingectomy (Bilateral Abdomen)     Patient location during evaluation: PACU Anesthesia Type: General Level of consciousness: awake and alert Pain management: pain level controlled Vital Signs Assessment: post-procedure vital signs reviewed and stable Respiratory status: spontaneous breathing, nonlabored ventilation and respiratory function stable Cardiovascular status: blood pressure returned to baseline and stable Postop Assessment: no apparent nausea or vomiting Anesthetic complications: no    Last Vitals:  Vitals:   01/01/18 1030 01/01/18 1045  BP: 123/74 122/74  Pulse: 67 66  Resp: (!) 26 (!) 24  Temp:    SpO2: 93% 94%    Last Pain:  Vitals:   01/01/18 1045  TempSrc:   PainSc: Asleep                 Lynda Rainwater

## 2018-01-01 NOTE — Transfer of Care (Signed)
   Last Vitals:  Vitals Value Taken Time  BP    Temp 36.8 C 01/01/2018  9:07 AM  Pulse 74 01/01/2018  9:08 AM  Resp 31 01/01/2018  9:08 AM  SpO2 96 % 01/01/2018  9:08 AM  Vitals shown include unvalidated device data.  Last Pain:  Vitals:   01/01/18 0545  TempSrc: Oral        Immediate Anesthesia Transfer of Care Note  Patient: [name]  Procedure(s) Performed: Procedure(s) (LRB): HYSTERECTOMY ABDOMINAL, salpingectomy (Bilateral)  Patient Location: PACU  Anesthesia Type: General  Level of Consciousness: sleepy  Airway & Oxygen Therapy: Patient Spontanous Breathing and Patient connected to nasal cannula oxygen  Post-op Assessment: Report given to PACU RN and Post -op Vital signs reviewed and stable  Post vital signs: Reviewed and stable  Complications: No apparent anesthesia complications

## 2018-01-01 NOTE — Anesthesia Procedure Notes (Signed)
Procedure Name: Intubation Date/Time: 01/01/2018 7:31 AM Performed by: Lynda Rainwater, MD Pre-anesthesia Checklist: Patient identified, Emergency Drugs available, Suction available and Patient being monitored Patient Re-evaluated:Patient Re-evaluated prior to induction Oxygen Delivery Method: Circle system utilized Preoxygenation: Pre-oxygenation with 100% oxygen Induction Type: IV induction Ventilation: Mask ventilation without difficulty Laryngoscope Size: Mac and 3 Grade View: Grade I Tube type: Oral Tube size: 7.0 mm Number of attempts: 1 Airway Equipment and Method: Stylet Placement Confirmation: ETT inserted through vocal cords under direct vision,  positive ETCO2 and breath sounds checked- equal and bilateral Secured at: 21 cm Tube secured with: Tape Dental Injury: Teeth and Oropharynx as per pre-operative assessment

## 2018-01-01 NOTE — Anesthesia Preprocedure Evaluation (Signed)
Anesthesia Evaluation  Patient identified by MRN, date of birth, ID band Patient awake  General Assessment Comment:Previous C/section she states she could not move her legs or walk for 24 hours  Reviewed: Allergy & Precautions, NPO status , Patient's Chart, lab work & pertinent test results  Airway Mallampati: II  TM Distance: >3 FB Neck ROM: Full    Dental  (+) Teeth Intact, Caps   Pulmonary neg pulmonary ROS, Current Smoker, former smoker,    Pulmonary exam normal breath sounds clear to auscultation       Cardiovascular negative cardio ROS Normal cardiovascular exam Rhythm:Regular Rate:Normal     Neuro/Psych PSYCHIATRIC DISORDERS Depression  Neuromuscular disease    GI/Hepatic Neg liver ROS, GERD  ,(+)     substance abuse  , Hx/o opioid dependence on subutex- last dose 4am   Endo/Other  negative endocrine ROS  Renal/GU negative Renal ROS  negative genitourinary   Musculoskeletal negative musculoskeletal ROS (+) Arthritis , Osteoarthritis,  Chronic back pain S/P lumbar fusion Right leg radiculopathy and numbness   Abdominal   Peds  Hematology  (+) anemia ,   Anesthesia Other Findings   Reproductive/Obstetrics negative OB ROS Hx/o previous C/Section Posterior placenta previa AMA                             Lab Results  Component Value Date   CREATININE 0.37 (L) 08/14/2017   BUN 6 08/14/2017   NA 137 08/14/2017   K 4.2 08/14/2017   CL 110 08/14/2017   CO2 21 (L) 08/14/2017    Lab Results  Component Value Date   WBC 11.2 (H) 12/28/2017   HGB 12.8 12/28/2017   HCT 39.0 12/28/2017   MCV 87.4 12/28/2017   PLT 268 12/28/2017    Anesthesia Physical  Anesthesia Plan  ASA: II and emergent  Anesthesia Plan: General   Post-op Pain Management:    Induction: Intravenous  PONV Risk Score and Plan: 3 and Ondansetron, Dexamethasone, Treatment may vary due to age or medical  condition and Midazolam  Airway Management Planned: Oral ETT  Additional Equipment:   Intra-op Plan:   Post-operative Plan: Extubation in OR  Informed Consent: I have reviewed the patients History and Physical, chart, labs and discussed the procedure including the risks, benefits and alternatives for the proposed anesthesia with the patient or authorized representative who has indicated his/her understanding and acceptance.     Plan Discussed with: CRNA and Surgeon  Anesthesia Plan Comments:         Anesthesia Quick Evaluation

## 2018-01-01 NOTE — Progress Notes (Signed)
Patient refusing to get out of bed. States that her back hurts from lying on operating table and she is working up to getting up. States she will not be rushed and risk having her "back give out" as soon as she stands up. Night RN aware and hopefully she will get out of bed this evening.

## 2018-01-01 NOTE — Op Note (Signed)
Preoperative diagnosis: Acute and chronic pelvic pain, menorrhagia  Postoperative diagnosis: Same plus pelvic adhesions  Procedure: TAH, bilateral salpingectomy, repair of inadvertent cystotomy  Surgeon: Matthew Saras  Assistant: [place]  EBL: 200 cc  Procedure findings:  The patient taken to the operating room after an adequate level of general anesthesia was obtained with the patient's upon the abdomen prepped and draped vagina was prepped separately and Foley catheter was position draining clear urine.  Appropriate timeouts were taken at that point.  Pfannenstiel incision was made after prepping excising the old scar this was carried down to the fascia which was incised and extended transversely.  Rectus muscles divided in the midline, peritoneum entered superiorly without incident and extended in a vertical fashion there were no anterior abdominal wall adhesions.  The Alexis retractor was position the patient was placed in Trendelenburg and bowel was packed superiorly out of the field.  Long Kelly clamps were then placed at each utero-ovarian pedicle the uterus itself was normal size bilateral adnexa and cul-de-sac were clear there were some bladder adhesions noted from her prior sections.  Starting on the left the round ligament was clamped divided and suture-ligated the left utero-ovarian pedicle was clamped divided and doubly free tied.  Thus conserving the left ovary which looked normal bilateral salpingectomy was performed by placing a Kelly clamp excision and suturing.  On the opposite side the same was performed conserving the right ovary peritoneum carried around to the midline the bladder was reflected fairly high from scarring from her prior section.  During the blunt dissection a cystotomy was made once this was identified of the dissection on the uterine side was carried down below the cervical vaginal junction ensuring that the bladder was well below.  In sequential manner the uterine  vasculature pedicle, cardinal ligament uterosacral ligament and cervical vaginal pedicles were clamped divided and suture ligated with 0 Vicryl.  The specimen was excised cuff was closed with interrupted 2-0 Vicryl sutures.  The cystotomy was then closed in 2 layers with 3-0 Vicryl running suture followed by the same and a second layer.  The this was at the dome.  The bladder was backfilled and under moderate pressure was noted to have a watertight seal this was placed to straight drain.  Prior to closure sponge, needle, instrument counts reported as correct x2 the ovaries were suspended to the round ligament with a single stitch on each side.  Peritoneum closed with a 3-0 Vicryl running suture of the same on the rectus muscles in the midline 0 PDS to close the fascia transversely subcutaneous tissue was undermined to affect better closure this was irrigated and made hemostatic with the Bovie diluted Exparel with quarter percent Marcaine and saline solution was then injected subfascially and subcutaneously for postop pain relief.  The subcutaneous tissue was fairly thin was not closed separately 4-0 Monocryl subcuticular skin closure with good approximation honeycomb dressing with pressure was applied she tolerated this well went to recovery room in good condition.  Dictated with Dragon Medical 1  Margarette Asal MD

## 2018-01-01 NOTE — Progress Notes (Signed)
Pt scrolling through and looking at cell phone during pre op phase questioning.

## 2018-01-01 NOTE — Progress Notes (Signed)
Having abd pain requiring IV Stadol, will start MS PCA>>>discussed cystotomy and need for 10 day foley cath

## 2018-01-02 LAB — CBC
HEMATOCRIT: 32.6 % — AB (ref 36.0–46.0)
HEMOGLOBIN: 10.4 g/dL — AB (ref 12.0–15.0)
MCH: 28.9 pg (ref 26.0–34.0)
MCHC: 31.9 g/dL (ref 30.0–36.0)
MCV: 90.6 fL (ref 80.0–100.0)
Platelets: 237 10*3/uL (ref 150–400)
RBC: 3.6 MIL/uL — ABNORMAL LOW (ref 3.87–5.11)
RDW: 15.4 % (ref 11.5–15.5)
WBC: 17.6 10*3/uL — ABNORMAL HIGH (ref 4.0–10.5)
nRBC: 0 % (ref 0.0–0.2)

## 2018-01-02 MED ORDER — OXYCODONE-ACETAMINOPHEN 5-325 MG PO TABS: 1.0000 | ORAL_TABLET | ORAL | 0 refills | Status: DC | PRN

## 2018-01-02 MED ORDER — IBUPROFEN 800 MG PO TABS: 800.0000 mg | ORAL_TABLET | Freq: Three times a day (TID) | ORAL | 0 refills | Status: DC | PRN

## 2018-01-02 NOTE — Progress Notes (Signed)
POD 1  S// alert + conversant, wants to go home later today  O// BP (!) 97/59 (BP Location: Left Arm)   Pulse 68   Temp 98.7 F (37.1 C) (Oral)   Resp 13   Ht 5\' 4"  (1.626 m)   Wt 67.2 kg   LMP 12/05/2017 Comment: continues to bleed since having baby  SpO2 100%   BMI 25.42 kg/m   CBC    Component Value Date/Time   WBC 17.6 (H) 01/02/2018 0437   RBC 3.60 (L) 01/02/2018 0437   HGB 10.4 (L) 01/02/2018 0437   HCT 32.6 (L) 01/02/2018 0437   PLT 237 01/02/2018 0437   MCV 90.6 01/02/2018 0437   MCH 28.9 01/02/2018 0437   MCHC 31.9 01/02/2018 0437   RDW 15.4 01/02/2018 0437   LYMPHSABS 1.9 08/14/2017 0719   MONOABS 0.6 08/14/2017 0719   EOSABS 0.4 08/14/2017 0719   BASOSABS 0.0 08/14/2017 0719   Dressing dry>>abd soft + bs  A+P// stable POD 1, will D/C w/ cath>>plan cystogram and D/C in 10 days

## 2018-01-02 NOTE — Discharge Summary (Signed)
Physician Discharge Summary  Patient ID: [name] MRN: [id number] DOB/AGE: 42/02/1975 42 y.o.  Admit date: 01/01/2018 Discharge date: 01/02/2018  Admission Diagnoses:Menorrhagia Pelvic pain  Discharge Diagnoses:  Active Problems:   Pelvic pain   Menorrhagia   Discharged Condition: good  Hospital Course: adm for TAH, BS for menorrhagia, pelvic pain>>had inadvertent cystotomy and will leave cath in 10 days, on POD 1, was afeb, tol PO, Inc C/D and readu for D/c   Consults: None  Significant Diagnostic Studies: labs:  CBC    Component Value Date/Time   WBC 17.6 (H) 01/02/2018 0437   RBC 3.60 (L) 01/02/2018 0437   HGB 10.4 (L) 01/02/2018 0437   HCT 32.6 (L) 01/02/2018 0437   PLT 237 01/02/2018 0437   MCV 90.6 01/02/2018 0437   MCH 28.9 01/02/2018 0437   MCHC 31.9 01/02/2018 0437   RDW 15.4 01/02/2018 0437   LYMPHSABS 1.9 08/14/2017 0719   MONOABS 0.6 08/14/2017 0719   EOSABS 0.4 08/14/2017 0719   BASOSABS 0.0 08/14/2017 0719     Treatments: surgery: TAH, BS, repair cystotomy  Discharge Exam: Blood pressure (!) 97/59, pulse 68, temperature 98.7 F (37.1 C), temperature source Oral, resp. rate 13, height 5\' 4"  (1.626 m), weight 67.2 kg, last menstrual period 12/05/2017, SpO2 100 %, not currently breastfeeding. General appearance: alert  Lungs clear abd dressing dry, + BS  Disposition: home, f/u for cath removal in 10 days   Allergies as of 01/02/2018      Reactions   Chantix [varenicline] Hives      Medication List    STOP taking these medications   clindamycin 1 % lotion Commonly known as:  CLEOCIN T   clobetasol 0.05 % external solution Commonly known as:  TEMOVATE   GOODY HEADACHE PO   medroxyPROGESTERone Acetate 150 MG/ML Susy   VIIBRYD 40 MG Tabs Generic drug:  Vilazodone HCl     TAKE these medications   ibuprofen 800 MG tablet Commonly known as:  ADVIL,MOTRIN Take 1 tablet (800 mg total) by mouth every 8 (eight) hours as needed  (mild pain).   oxyCODONE-acetaminophen 5-325 MG tablet Commonly known as:  PERCOCET/ROXICET Take 1 tablet by mouth every 4 (four) hours as needed for severe pain.   SUBOXONE 8-2 MG Film Generic drug:  Buprenorphine HCl-Naloxone HCl Place 1 Film under the tongue 3 (three) times daily.      Follow-up Information    Ralene Bathe, MD .   Specialty:  Ophthalmology Contact information: [address] [place] [address] [phone]        Molli Posey, MD Follow up in 10 day(s).   Specialty:  Obstetrics and Gynecology Why:  office will call her Contact information: [place] [address] [phone]           Signed: Margarette Asal 01/02/2018, 7:23 AM

## 2018-01-02 NOTE — Progress Notes (Signed)
Pt PIV removed, discharge information given and reviewed with pt. Pt discharged with foley cath in place. Care instructions given along with extra leg bag. Pt and caregiver verbalize understanding and deny questions or concerns

## 2018-01-07 ENCOUNTER — Encounter (HOSPITAL_BASED_OUTPATIENT_CLINIC_OR_DEPARTMENT_OTHER): Payer: Self-pay | Admitting: Obstetrics and Gynecology

## 2018-01-07 ENCOUNTER — Other Ambulatory Visit (HOSPITAL_COMMUNITY): Payer: Self-pay | Admitting: Obstetrics and Gynecology

## 2018-01-07 DIAGNOSIS — N9989 Other postprocedural complications and disorders of genitourinary system: Secondary | ICD-10-CM

## 2018-01-10 ENCOUNTER — Ambulatory Visit (HOSPITAL_COMMUNITY): Payer: 59

## 2018-01-10 ENCOUNTER — Ambulatory Visit (INDEPENDENT_AMBULATORY_CARE_PROVIDER_SITE_OTHER): Payer: 59 | Admitting: Neurology

## 2018-01-10 ENCOUNTER — Encounter: Payer: Self-pay | Admitting: Neurology

## 2018-01-10 ENCOUNTER — Encounter

## 2018-01-10 VITALS — BP 112/62 | HR 66 | Ht 64.0 in | Wt 141.5 lb

## 2018-01-10 DIAGNOSIS — R202 Paresthesia of skin: Secondary | ICD-10-CM | POA: Diagnosis not present

## 2018-01-11 ENCOUNTER — Telehealth: Payer: Self-pay | Admitting: Family Medicine

## 2018-01-11 DIAGNOSIS — R202 Paresthesia of skin: Secondary | ICD-10-CM | POA: Insufficient documentation

## 2018-01-11 NOTE — Telephone Encounter (Signed)
Received requested records from [facility]. Sending back for review.
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
## 2018-01-14 ENCOUNTER — Ambulatory Visit (HOSPITAL_COMMUNITY)
Admission: RE | Admit: 2018-01-14 | Discharge: 2018-01-14 | Disposition: A | Discharge status: Home / Self Care | Payer: 59 | Source: Ambulatory Visit | Attending: Obstetrics and Gynecology | Admitting: Obstetrics and Gynecology

## 2018-01-14 ENCOUNTER — Telehealth: Payer: Self-pay | Admitting: Neurology

## 2018-01-14 DIAGNOSIS — S3720XD Unspecified injury of bladder, subsequent encounter: Secondary | ICD-10-CM | POA: Diagnosis not present

## 2018-01-14 DIAGNOSIS — N9989 Other postprocedural complications and disorders of genitourinary system: Secondary | ICD-10-CM | POA: Diagnosis not present

## 2018-01-14 MED ORDER — IOTHALAMATE MEGLUMINE 17.2 % UR SOLN: 250.0000 mL | Freq: Once | URETHRAL | Status: DC | PRN

## 2018-01-14 NOTE — Telephone Encounter (Signed)
[employer] pending faxed clnical notes

## 2018-01-15 NOTE — Telephone Encounter (Signed)
unable to leave vmail on mobile vmail box is full home # just kept ringing did not go to [place]: [id number] (exp. 01/14/18 to 02/28/18)

## 2018-01-16 NOTE — Telephone Encounter (Signed)
x2 tried to reach out to the patient vmail is full.

## 2018-01-25 DIAGNOSIS — G894 Chronic pain syndrome: Secondary | ICD-10-CM | POA: Diagnosis not present

## 2018-02-05 DIAGNOSIS — N393 Stress incontinence (female) (male): Secondary | ICD-10-CM | POA: Diagnosis not present

## 2018-02-13 DIAGNOSIS — G894 Chronic pain syndrome: Secondary | ICD-10-CM | POA: Diagnosis not present

## 2018-02-19 ENCOUNTER — Encounter: Payer: Self-pay | Admitting: Family Medicine

## 2018-02-27 DIAGNOSIS — G894 Chronic pain syndrome: Secondary | ICD-10-CM | POA: Diagnosis not present

## 2018-03-01 ENCOUNTER — Encounter: Payer: Self-pay | Admitting: Internal Medicine

## 2018-03-26 DIAGNOSIS — G894 Chronic pain syndrome: Secondary | ICD-10-CM | POA: Diagnosis not present

## 2018-04-22 DIAGNOSIS — M545 Low back pain: Secondary | ICD-10-CM | POA: Diagnosis not present

## 2018-05-21 DIAGNOSIS — G894 Chronic pain syndrome: Secondary | ICD-10-CM | POA: Diagnosis not present

## 2019-06-10 IMAGING — CT CT ABD-PELV W/ CM
2 of 6 series · 15 of 46 positions shown, 17 images · IV contrast (ISOVUE)
Comparison: 08/07/2017 radiographs.  05/20/2007 CT

CLINICAL DATA: 42-year-old female with acute abdominal pain and
vomiting. C-section on 07/25/2017.

EXAM:
CT ABDOMEN AND PELVIS WITH CONTRAST
TECHNIQUE: Multidetector CT imaging of the abdomen and pelvis was performed
using the standard protocol following bolus administration of
intravenous contrast.
CONTRAST:  100mL [id number] IOPAMIDOL ([id number]) INJECTION 61%

[Series 2: axial st · axial · 0.71mm/px · z∈[-513,-128]mm · 12 of 92 slices shown, 14 images]
[im 8/92  soft-tissue]
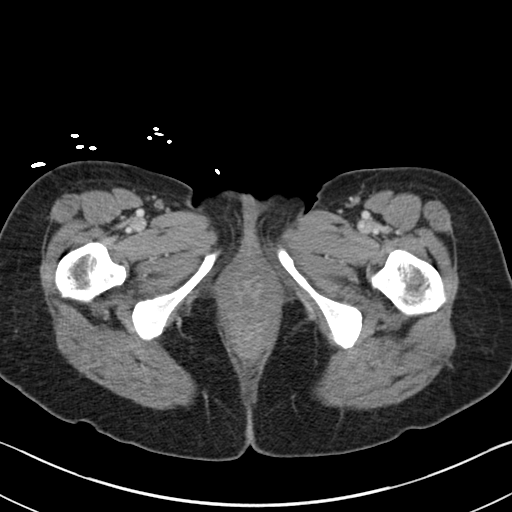
[im 8/92  bone]
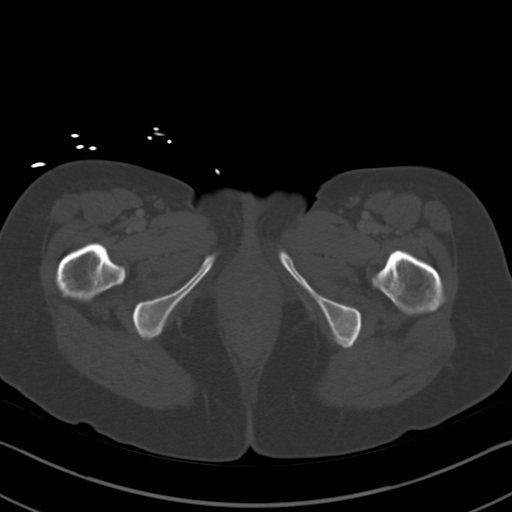
[im 15/92  soft-tissue]
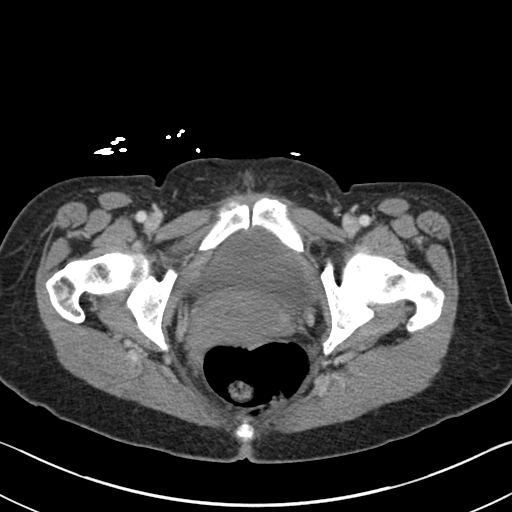
[im 22/92  soft-tissue]
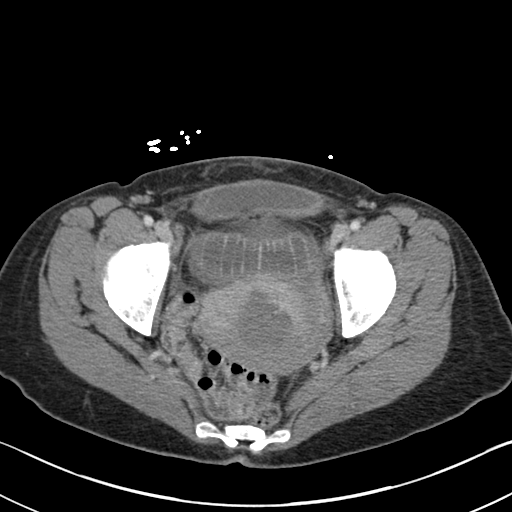
[im 29/92  soft-tissue]
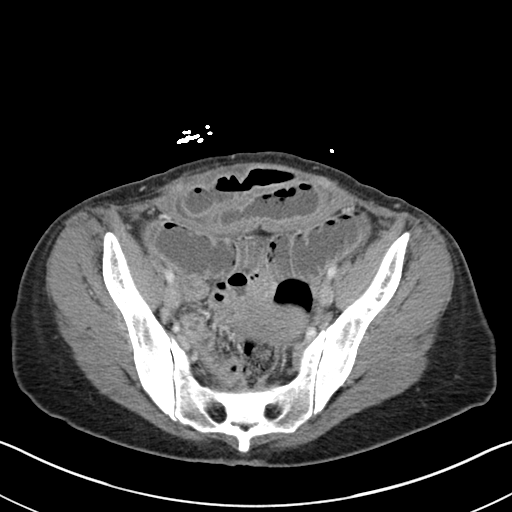
[im 36/92  soft-tissue]
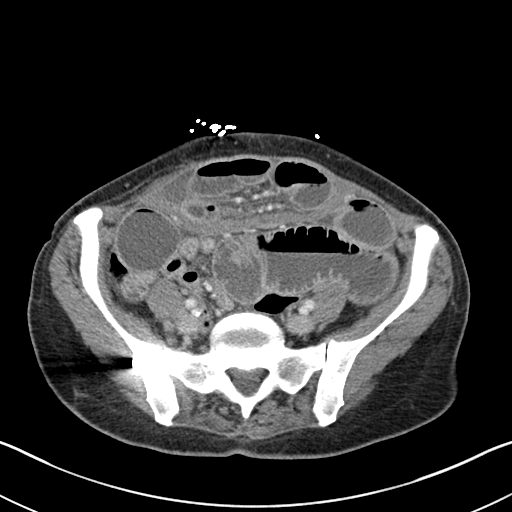
[im 43/92  soft-tissue]
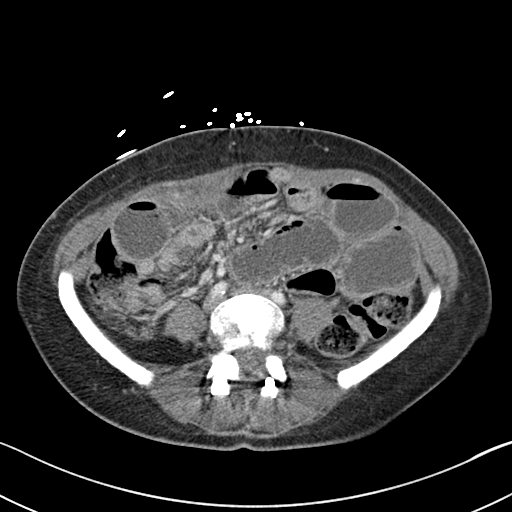
[im 50/92  soft-tissue]
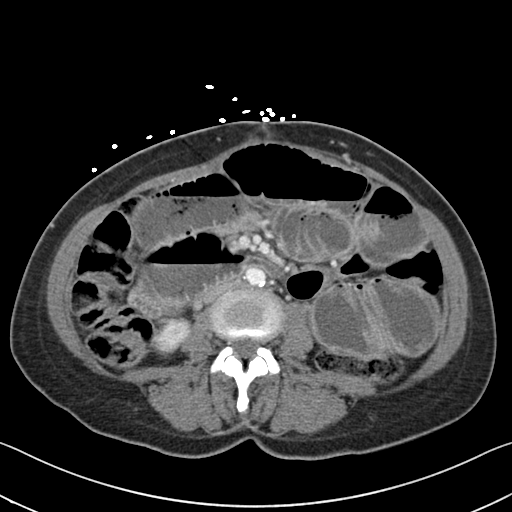
[im 57/92  soft-tissue]
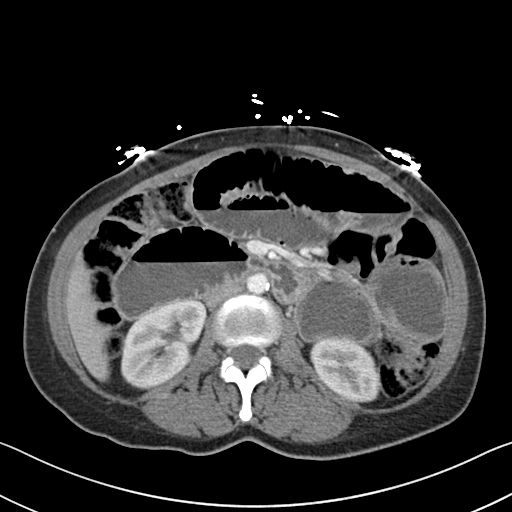
[im 64/92  soft-tissue]
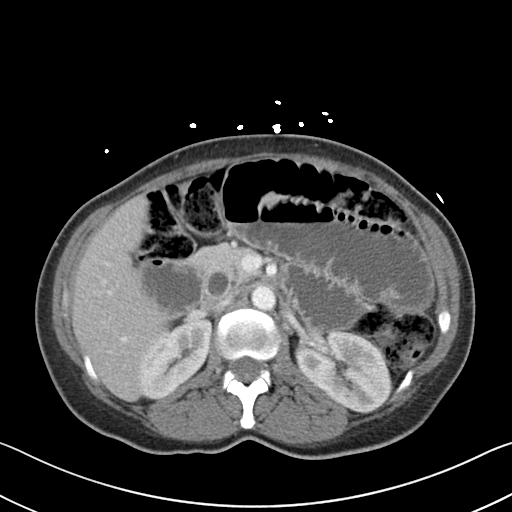
[im 64/92  bone]
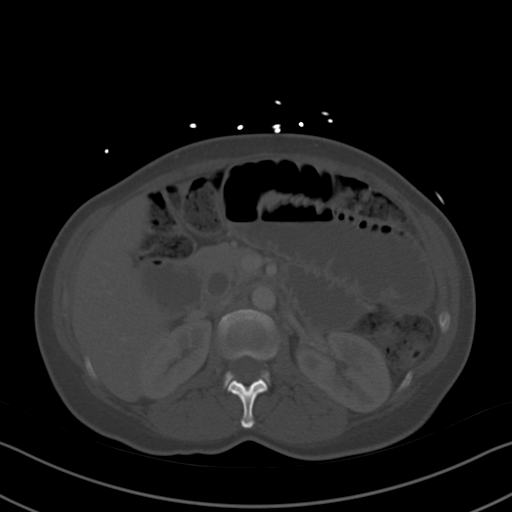
[im 71/92  soft-tissue]
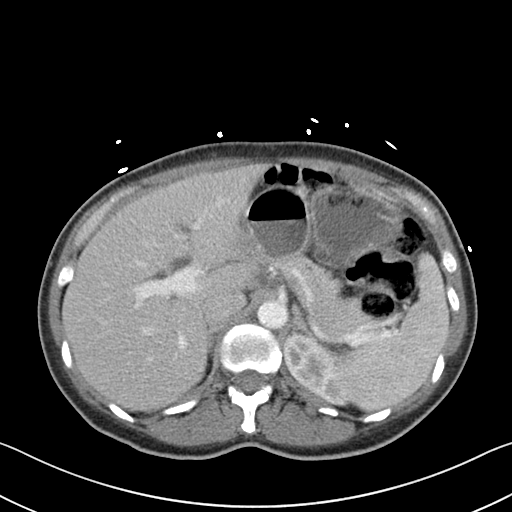
[im 78/92  soft-tissue]
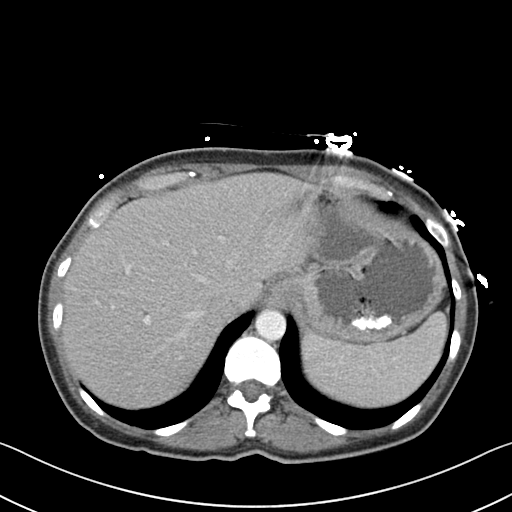
[im 85/92  soft-tissue]
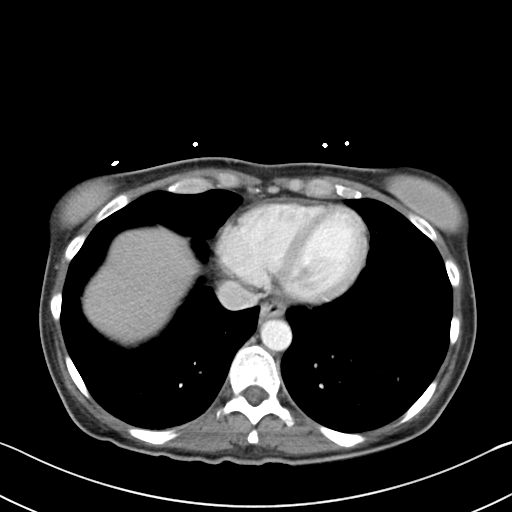

[Series 7: coronal st · coronal · 0.69mm/px · 3 of 82 slices shown]
[im 28/82  soft-tissue]
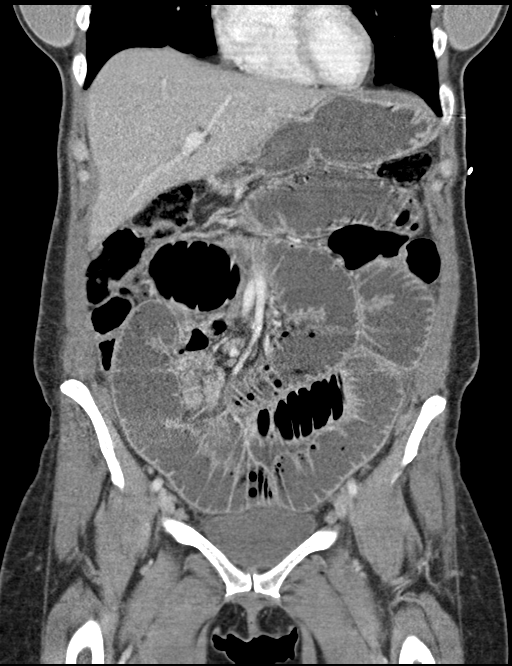
[im 37/82  soft-tissue]
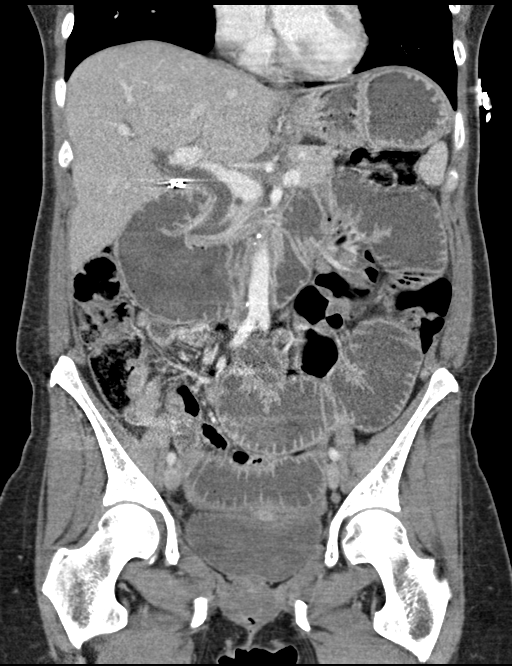
[im 46/82  soft-tissue]
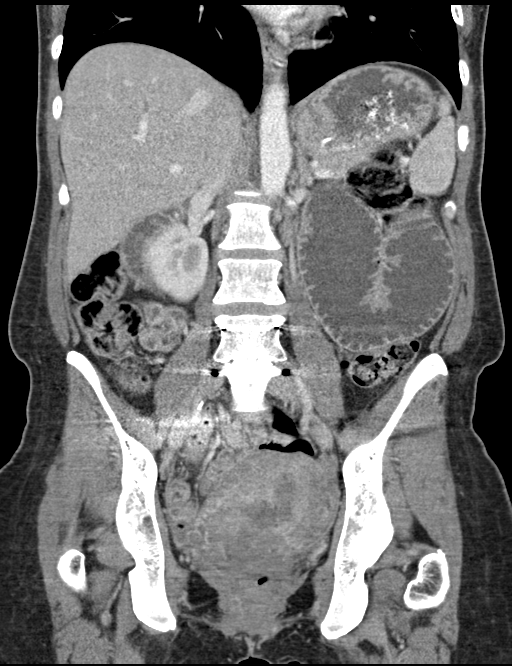

[15 of 46 positions shown; findings below may reference images not displayed]

FINDINGS: Lower chest: No acute abnormality.

Hepatobiliary: The liver is unremarkable. Patient is status post
cholecystectomy. CBD dilatation to the ampulla is noted and measures
12 mm.

Pancreas: Unremarkable

Spleen: Unremarkable

Adrenals/Urinary Tract: The kidneys, adrenal glands and bladder are
unremarkable.

Stomach/Bowel: Dilated proximal and mid small bowel loops with
collapsed distal small bowel loops are compatible with a high-grade
small bowel obstruction. The transition point is likely located
within the anterior pelvis which there is a suggestion of anterior
abdominal/pelvic wall eventration or hernia. Fluid within the
eventration/hernia sac noted.

There is no evidence of pneumoperitoneum.  No abscess is identified.

Vascular/Lymphatic: Aortic atherosclerosis. No enlarged abdominal or
pelvic lymph nodes.

Reproductive: Enlarged uterus with C-section changes noted.

Other: No ascites noted.

Musculoskeletal: No acute or suspicious bony abnormalities noted.
LOWER lumbar and RIGHT SI joint effusion hardware identified.
IMPRESSION: 1. High-grade small bowel obstruction with transition point likely
within an anterior abdominal/pelvic wall eventration or hernia sac.
No evidence of pneumoperitoneum.
2. Enlarged postpartum uterus with C-section changes.
3. CBD dilatation which could be related to small bowel obstruction.

## 2019-09-19 ENCOUNTER — Other Ambulatory Visit: Payer: Self-pay

## 2019-09-19 ENCOUNTER — Emergency Department (HOSPITAL_COMMUNITY)
Admission: EM | Admit: 2019-09-19 | Discharge: 2019-09-19 | Disposition: A | Discharge status: Home / Self Care | Payer: BC Managed Care – PPO | Attending: Emergency Medicine | Admitting: Emergency Medicine

## 2019-09-19 ENCOUNTER — Emergency Department (HOSPITAL_COMMUNITY): Payer: BC Managed Care – PPO

## 2019-09-19 ENCOUNTER — Encounter (HOSPITAL_COMMUNITY): Payer: Self-pay | Admitting: Emergency Medicine

## 2019-09-19 DIAGNOSIS — K219 Gastro-esophageal reflux disease without esophagitis: Secondary | ICD-10-CM | POA: Diagnosis not present

## 2019-09-19 DIAGNOSIS — Z87891 Personal history of nicotine dependence: Secondary | ICD-10-CM | POA: Insufficient documentation

## 2019-09-19 DIAGNOSIS — K56600 Partial intestinal obstruction, unspecified as to cause: Secondary | ICD-10-CM

## 2019-09-19 DIAGNOSIS — Z20822 Contact with and (suspected) exposure to covid-19: Secondary | ICD-10-CM | POA: Diagnosis not present

## 2019-09-19 DIAGNOSIS — R1084 Generalized abdominal pain: Secondary | ICD-10-CM

## 2019-09-19 DIAGNOSIS — R109 Unspecified abdominal pain: Secondary | ICD-10-CM | POA: Diagnosis present

## 2019-09-19 LAB — URINALYSIS, ROUTINE W REFLEX MICROSCOPIC
Bilirubin Urine: NEGATIVE
Glucose, UA: NEGATIVE mg/dL
Hgb urine dipstick: NEGATIVE
Ketones, ur: NEGATIVE mg/dL
Leukocytes,Ua: NEGATIVE
Nitrite: NEGATIVE
Protein, ur: 30 mg/dL — AB
Specific Gravity, Urine: 1.027 (ref 1.005–1.030)
pH: 5 (ref 5.0–8.0)

## 2019-09-19 LAB — COMPREHENSIVE METABOLIC PANEL
ALT: 20 U/L (ref 0–44)
AST: 29 U/L (ref 15–41)
Albumin: 5.2 g/dL — ABNORMAL HIGH (ref 3.5–5.0)
Alkaline Phosphatase: 81 U/L (ref 38–126)
Anion gap: 16 — ABNORMAL HIGH (ref 5–15)
BUN: 18 mg/dL (ref 6–20)
CO2: 21 mmol/L — ABNORMAL LOW (ref 22–32)
Calcium: 10.3 mg/dL (ref 8.9–10.3)
Chloride: 102 mmol/L (ref 98–111)
Creatinine, Ser: 0.73 mg/dL (ref 0.44–1.00)
GFR calc Af Amer: >60 mL/min (ref >60)
GFR calc non Af Amer: >60 mL/min (ref >60)
Glucose, Bld: 149 mg/dL — ABNORMAL HIGH (ref 70–99)
Potassium: 3.8 mmol/L (ref 3.5–5.1)
Sodium: 139 mmol/L (ref 135–145)
Total Bilirubin: 0.6 mg/dL (ref 0.3–1.2)
Total Protein: 9.1 g/dL — ABNORMAL HIGH (ref 6.5–8.1)

## 2019-09-19 LAB — LIPASE, BLOOD: Lipase: 24 U/L (ref 11–51)

## 2019-09-19 LAB — HCG, QUANTITATIVE, PREGNANCY: hCG, Beta Chain, Quant, S: 1 m[IU]/mL (ref <5)

## 2019-09-19 MED ORDER — ONDANSETRON 4 MG PO TBDP: 4.0000 mg | ORAL_TABLET | ORAL | 0 refills | Status: DC | PRN

## 2019-09-19 MED ORDER — SODIUM CHLORIDE (PF) 0.9 % IJ SOLN
INTRAMUSCULAR | Status: AC
  Filled 2019-09-19: qty 50

## 2019-09-19 MED ORDER — OMEPRAZOLE 20 MG PO CPDR: 20.0000 mg | DELAYED_RELEASE_CAPSULE | Freq: Every day | ORAL | 0 refills | Status: DC

## 2019-09-19 MED ORDER — PANTOPRAZOLE SODIUM 40 MG IV SOLR
40.0000 mg | Freq: Once | INTRAVENOUS | Status: AC
  Administered 2019-09-19: 40 mg via INTRAVENOUS
  Filled 2019-09-19: qty 40

## 2019-09-19 MED ORDER — LACTATED RINGERS IV BOLUS
2000.0000 mL | Freq: Once | INTRAVENOUS | Status: AC
  Administered 2019-09-19: 2000 mL via INTRAVENOUS

## 2019-09-19 NOTE — ED Triage Notes (Addendum)
Patient brought in by [facility] from home. Patient complaining of upper abdominal pain that started this morning and has gotten worse. Nausea and vomiting. Last bm yesterday.

## 2019-09-19 NOTE — ED Provider Notes (Addendum)
[facility] DEPT Provider Note   CSN: [id number] Arrival date & time: 09/19/19  0258     History Chief Complaint  Patient presents with  . Abdominal Pain    [name] is a 44 y.o. female.  HPI Patient with history of C-section, 07/2017.  This was followed by small bowel obstruction requiring emergency surgery, 08/2017.  Patient had subsequent hysterectomy and lysis of adhesions 12/2017.  Patient reports since that time she has been doing well without recurrence of symptoms.  Yesterday she started developing abdominal bloating and discomfort.  Cramping in nature.  Patient reports she is unable to pass gas starting yesterday.  Vomiting multiple episodes overnight.  Currently feels improved.  Patient however denies having passed any gas or bowel movement.    Past Medical History:  Diagnosis Date  . AMA (advanced maternal age) multigravida 2+   . Arthritis   . Chronic back pain   . Depression    no meds   . Hx of eating disorder   . Numbness    right leg  . Substance abuse ([place])    since 2009    Patient Active Problem List   Diagnosis Date Noted  . Paresthesia 01/11/2018  . Pelvic pain 01/01/2018  . Menorrhagia 01/01/2018  . SBO (small bowel obstruction) ([place]) 08/09/2017  . Tobacco abuse 08/09/2017  . GERD (gastroesophageal reflux disease) 08/09/2017  . Hypokalemia 08/09/2017  . Leukocytosis 08/09/2017  . Dehiscence of fascia s/p C/S 07/25/2017 08/09/2017  . 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 08/09/2017 08/09/2017  . Substance abuse ([place])   . Pregnancy 07/25/2017  . Vaginal bleeding in pregnancy, second trimester 04/06/2017  . IUGR (intrauterine growth restriction) 02/28/2015  . S/P cesarean section 02/28/2015  . Radiculopathy 06/04/2014  . Low back pain 09/23/2012    Past Surgical History:  Procedure Laterality Date  . ABDOMINAL HYSTERECTOMY Bilateral 01/01/2018   Procedure: HYSTERECTOMY ABDOMINAL,  salpingectomy;  Surgeon: Molli Posey, MD;  Location: [facility];  Service: Gynecology;  Laterality: Bilateral;  . BACK SURGERY     Fusion  . BREAST SURGERY     augmentation  . BUNIONECTOMY Bilateral    screws  . CESAREAN SECTION N/A 02/28/2015   Procedure: CESAREAN SECTION;  Surgeon: Linda Hedges, DO;  Location: [facility];  Service: Obstetrics;  Laterality: N/A;  . CESAREAN SECTION N/A 07/25/2017   Procedure: REPEAT CESAREAN SECTION;  Surgeon: Molli Posey, MD;  Location: [facility];  Service: Obstetrics;  Laterality: N/A;  Repeat edc 08/18/17 NKDA  . CHOLECYSTECTOMY    . DIAGNOSTIC LAPAROSCOPY    . I fuse     second back surgery states this is the name of the procedure  . LAPAROTOMY N/A 08/09/2017   Procedure: LAPAROSCOPY, LYSIS OF ADHESIONS, SMALL BOWEL RESECTION, FASCIAL CLOSURE, BILATERAL TAP BLOCK;  Surgeon: Michael Boston, MD;  Location: [facility];  Service: General;  Laterality: N/A;  . LUMBAR FUSION    . SMALL INTESTINE SURGERY  08/2017     OB History    Gravida  5   Para  3   Term  2   Preterm  1   AB  2   Living  3     SAB  1   TAB  1   Ectopic      Multiple  0   Live Births  3           Family History  Problem Relation Age of Onset  .  Hypertension Father   . Arthritis Father   . Lung cancer Father     Social History   Tobacco Use  . Smoking status: Former Smoker    Packs/day: 0.25    Years: 9.00    Pack years: 2.25    Types: Cigarettes    Quit date: 2017    Years since quitting: 4.6  . Smokeless tobacco: Never Used  Vaping Use  . Vaping Use: Never used  Substance Use Topics  . Alcohol use: Yes    Comment: once every two months  . Drug use: Not Currently    Comment: suboxone    Home Medications Prior to Admission medications   Medication Sig Start Date End Date Taking? Authorizing Provider  ARIPiprazole (ABILIFY) 5 MG tablet Take 5 mg by mouth at bedtime. 09/12/19  Yes [provider]    Aspirin-Acetaminophen-Caffeine (GOODY HEADACHE PO) Take 1 Package by mouth as needed (pain, headache).    Yes [provider]  naproxen sodium (ALEVE) 220 MG tablet Take 220 mg by mouth daily as needed (pain).   Yes [provider]  ZUBSOLV 5.7-1.4 MG SUBL Place 1 tablet under the tongue 2 (two) times daily. 09/12/19  Yes [provider]    Allergies    Chantix [varenicline]  Review of Systems   Review of Systems 10 systems reviewed and negative except as per HPI Physical Exam Updated Vital Signs BP 98/67   Pulse (!) 59   Temp 97.8 F (36.6 C) (Oral)   Resp 19   Ht 5\' 4"  (1.626 m)   Wt 58.5 kg   LMP 12/05/2017 Comment: continues to bleed since having baby  SpO2 94%   BMI 22.14 kg/m   Physical Exam Constitutional:      Comments: Alert and nontoxic.  No respiratory distress.  Appears fatigued and uncomfortable.  HENT:     Head: Normocephalic and atraumatic.  Eyes:     Extraocular Movements: Extraocular movements intact.     Conjunctiva/sclera: Conjunctivae normal.  Cardiovascular:     Rate and Rhythm: Normal rate and regular rhythm.  Pulmonary:     Effort: Pulmonary effort is normal.     Breath sounds: Normal breath sounds.  Abdominal:     General: There is no distension.     Palpations: Abdomen is soft.     Tenderness: There is no abdominal tenderness. There is no guarding.  Musculoskeletal:        General: No swelling or tenderness. Normal range of motion.     Right lower leg: No edema.     Left lower leg: No edema.  Skin:    General: Skin is warm and dry.  Neurological:     General: No focal deficit present.     Mental Status: She is oriented to person, place, and time.     Coordination: Coordination normal.  Psychiatric:        Mood and Affect: Mood normal.     ED Results / Procedures / Treatments   Labs (all labs ordered are listed, but only abnormal results are displayed) Labs Reviewed  COMPREHENSIVE METABOLIC PANEL -  Abnormal; Notable for the following components:      Result Value   CO2 21 (*)    Glucose, Bld 149 (*)    Total Protein 9.1 (*)    Albumin 5.2 (*)    Anion gap 16 (*)    All other components within normal limits  URINALYSIS, ROUTINE W REFLEX MICROSCOPIC - Abnormal; Notable for  the following components:   Color, Urine AMBER (*)    APPearance HAZY (*)    Protein, ur 30 (*)    Bacteria, UA FEW (*)    All other components within normal limits  SARS CORONAVIRUS 2 BY RT PCR (HOSPITAL ORDER, [facility])  LIPASE, BLOOD  HCG, QUANTITATIVE, PREGNANCY  CBC    EKG None  Radiology CT Abdomen Pelvis Wo Contrast  Result Date: 09/19/2019 CLINICAL DATA:  Nausea and vomiting. EXAM: CT ABDOMEN AND PELVIS WITHOUT CONTRAST TECHNIQUE: Multidetector CT imaging of the abdomen and pelvis was performed following the standard protocol without IV contrast. COMPARISON:  August 08, 2017 FINDINGS: Evaluation is limited secondary to lack of IV contrast. Lower chest: No acute abnormality.  Bilateral breast implants. Hepatobiliary: Status post cholecystectomy. Unremarkable noncontrast appearance of the liver. Revisualization of dilation of the common bile duct to 12 mm, similar in comparison to prior and likely due to post cholecystectomy state. Pancreas: Unremarkable noncontrast appearance of the pancreas. Spleen: Normal in size without focal abnormality. Adrenals/Urinary Tract: Adrenal glands are unremarkable. Kidneys are normal, without renal calculi or hydronephrosis. Bladder is unremarkable. Stomach/Bowel: Stomach is unremarkable. There is a single dilated loop of small bowel in the LEFT hemiabdomen without upstream dilation. It measures 3.1 cm in diameter. It is upstream of surgical sutures in the mid abdomen (series 3, image 54). There is an apparent short segment narrowing which spans 2.9 cm at the level of the sutures although this could be artifactual due to peristalsis. Mildly prominent  small bowel loops in the abdomen. Appendix is unremarkable. Moderate colonic stool burden. Vascular/Lymphatic: Scattered atherosclerotic calcifications. No suspicious lymphadenopathy visualized within the limitations of this noncontrast examination. Reproductive: Status post hysterectomy. Other: Tiny periumbilical fat containing hernia. Musculoskeletal: Status post posterior fusion and laminectomy of L4-5 and fusion of the RIGHT sacroiliac joint. IMPRESSION: 1. There is a single dilated loop of small bowel in the LEFT hemiabdomen without upstream dilation. It is upstream of surgical sutures in the mid abdomen. There is an apparent short segment narrowing which spans 2.9 cm at the level of the sutures although this could be artifactual due to peristalsis. Dilation of this loop of small bowel could be due to stenosis versus early bowel obstruction. 2. Mildly prominent small bowel loops in the abdomen. Findings may represent ileus or enteritis. 3. Revisualization of dilation of the common bile duct to 12 mm, similar in comparison to prior and likely due to post cholecystectomy state. Electronically Signed   By: Valentino Saxon MD   On: 09/19/2019 09:41    Procedures Procedures (including critical care time)  Medications Ordered in ED Medications  lactated ringers bolus 2,000 mL (2,000 mLs Intravenous New Bag/Given (Non-Interop) 09/19/19 0905)  pantoprazole (PROTONIX) injection 40 mg (40 mg Intravenous Given 09/19/19 5277)    ED Course  I have reviewed the triage vital signs and the nursing notes.  Pertinent labs & imaging results that were available during my care of the patient were reviewed by me and considered in my medical decision making (see chart for details).    MDM Rules/Calculators/A&P                         Consult: Reviewed with general surgery.  Will consult for partial early small bowel obstruction  Patient presents as outlined above.  She has history complicated by history of  small bowel obstruction with required resection after C-section in 2019.  Symptoms started  yesterday with bloating and frequent vomiting.  No fever, diarrhea or urinary symptoms.  No cough or shortness of breath.  After vomiting multiple times, patient did feel some improvement in symptoms.  Due to significant history of bowel obstruction, CT scan obtained.  CT illustrates early small bowel obstruction.  Will plan for admission to medical service with consultation by general surgery.  General  surgery has evaluated the patient and reviewed CT scans.  At this time, with symptoms resolved and no complete obstruction, okay to start liquid diet and trial conservative care.  Patient was given liquids to start in the emergency department.  She did not drink much but reported she wished to be discharged.  She has not had recurrence of vomiting.  At this time careful instructions given for clear diet and then soft diet.  Return precautions reviewed. Final Clinical Impression(s) / ED Diagnoses Final diagnoses:  Partial small bowel obstruction [facility])    Rx / DC Orders ED Discharge Orders    None       Charlesetta Shanks, MD 09/19/19 1206    Charlesetta Shanks, MD 09/19/19 1426

## 2019-09-19 NOTE — ED Notes (Signed)
Made Dr Johnney Killian aware that patient is requesting to go home. Awaiting discharge orders.

## 2019-09-19 NOTE — ED Notes (Signed)
This RN went to check to see if pt drank more fluids. Pt did not drink anymore and states that she wants to go home.

## 2019-09-19 NOTE — Discharge Instructions (Addendum)
1.  Drink only clear fluids for the next 48 hours.  If you are not having any vomiting, pain or bloating, you may go to very soft, thin low-fat foods.  You need to stay on a soft, low-fat diet for 2 weeks. 2.  Schedule a recheck with your primary care doctor within 1 to 3 days. 3.  Return to emergency department immediately if you have recurrence of bloating, vomiting, pain or other concerning symptoms. 4.  You may take Zofran for nausea and omeprazole to decrease stomach acid secretion.

## 2019-09-19 NOTE — ED Notes (Signed)
Pt asked how she is tolerating sips of water PO and if she felt she was tolerating well enough to go home. Pt states that she only had one sip. This Rn instructed pt to have more sips of water and will come back to re-evaluate if able to tolerate.

## 2019-09-19 NOTE — ED Notes (Signed)
Pt given ice water for PO challenge.

## 2019-09-19 NOTE — H&P (Signed)
[name] 06-Feb-1976  [id number].    Requesting MD: Dr. Charlesetta Shanks Chief Complaint: Abdominal pain, n/v  Reason for Consult: SBO   HPI: [name] is a 44 y.o. female who presented to [facility] with abdominal pain, nausea, vomiting.  Patient reports the last 3 days she has been having abdominal bloating and discomfort especially after eating.  Yesterday evening she began having generalized abdominal cramping as well as nausea and several episodes of bilious emesis.  Her last episode of emesis was around 1 AM.  She reports she is still passing flatus.  No BM in 3 days.  Notes that she normally has a BM every 2-3 days.  Currently her symptoms have resolved and she is without any nausea, or abdominal cramping/pain. Last PO intake was last night.   In the ED patient was afebrile. CBC is still pending. K 3.8, Cr 0.73. CT with single dilated loop of small bowel in the LEFT hemiabdomen without upstream dilation that could be an early SBO.   Patient has hx of cholecystectomy, C-Section x 2; dx laparoscopy w/ LOA, SBR, and fascial closure for a incarcerated incisional hernia with ischemic bowel following a c-section; as well as a abdominal hysterectomy. She is not on any blood thinners.  ROS: Review of Systems  Constitutional: Negative for chills and fever.  Respiratory: Negative for cough and shortness of breath.   Cardiovascular: Negative for chest pain and leg swelling.  Gastrointestinal: Positive for abdominal pain, constipation, nausea and vomiting. Negative for diarrhea.  Genitourinary: Negative for dysuria.  Musculoskeletal: Negative for back pain.  Psychiatric/Behavioral: Negative for substance abuse.  All other systems reviewed and are negative.   Family History  Problem Relation Age of Onset  . Hypertension Father   . Arthritis Father   . Lung cancer Father     Past Medical History:  Diagnosis Date  . AMA (advanced maternal age) multigravida 40+   . Arthritis   .  Chronic back pain   . Depression    no meds   . Hx of eating disorder   . Numbness    right leg  . Substance abuse ([place])    since 2009    Past Surgical History:  Procedure Laterality Date  . ABDOMINAL HYSTERECTOMY Bilateral 01/01/2018   Procedure: HYSTERECTOMY ABDOMINAL, salpingectomy;  Surgeon: Molli Posey, MD;  Location: [facility];  Service: Gynecology;  Laterality: Bilateral;  . BACK SURGERY     Fusion  . BREAST SURGERY     augmentation  . BUNIONECTOMY Bilateral    screws  . CESAREAN SECTION N/A 02/28/2015   Procedure: CESAREAN SECTION;  Surgeon: Linda Hedges, DO;  Location: [facility];  Service: Obstetrics;  Laterality: N/A;  . CESAREAN SECTION N/A 07/25/2017   Procedure: REPEAT CESAREAN SECTION;  Surgeon: Molli Posey, MD;  Location: [place];  Service: Obstetrics;  Laterality: N/A;  Repeat edc 08/18/17 NKDA  . CHOLECYSTECTOMY    . DIAGNOSTIC LAPAROSCOPY    . I fuse     second back surgery states this is the name of the procedure  . LAPAROTOMY N/A 08/09/2017   Procedure: LAPAROSCOPY, LYSIS OF ADHESIONS, SMALL BOWEL RESECTION, FASCIAL CLOSURE, BILATERAL TAP BLOCK;  Surgeon:  Boston, MD;  Location: [facility];  Service: General;  Laterality: N/A;  . LUMBAR FUSION    . SMALL INTESTINE SURGERY  08/2017    Social History:  reports that she quit smoking about 4 years ago. Her smoking use included cigarettes. She  has a 2.25 pack-year smoking history. She has never used smokeless tobacco. She reports current alcohol use. She reports previous drug use. Smokes 1 PPD No current Illicit drug use  Allergies:  Allergies  Allergen Reactions  . Chantix [Varenicline] Hives    (Not in a hospital admission)    Physical Exam: Blood pressure 98/67, pulse (!) 59, temperature 97.8 F (36.6 C), temperature source Oral, resp. rate 19, height 5\' 4"  (1.626 m), weight 58.5 kg, last menstrual period 12/05/2017, SpO2 94 %. General: pleasant, WD/WN white female  who is laying in bed in NAD HEENT: head is normocephalic, atraumatic.  Sclera are noninjected.  PERRL.  Ears and nose without any masses or lesions.  Mouth is pink and moist. Dentition fair Heart: regular, rate, and rhythm.  Normal s1,s2. No obvious murmurs, gallops, or rubs noted.  Palpable pedal pulses bilaterally  Lungs: CTAB, no wheezes, rhonchi, or rales noted.  Respiratory effort nonlabored Abd: Soft, ND, mild epigastric tenderness without r/r/g. No peritonitis. +BS, no masses, hernias, or organomegaly. Prior abdominal scars are well healed.  MS: no BUE/BLE edema, calves soft and nontender Skin: warm and dry with no masses, lesions, or rashes Psych: A&Ox4 with an appropriate affect Neuro: cranial nerves grossly intact, equal strength in BUE/BLE bilaterally, normal speech, though process intact  Results for orders placed or performed during the hospital encounter of 09/19/19 (from the past 48 hour(s))  Lipase, blood     Status: None   Collection Time: 09/19/19  1:02 AM  Result Value Ref Range   Lipase 24 11 - 51 U/L    Comment: Performed at [facility], [place] [address], [place], [place] [address]  Comprehensive metabolic panel     Status: Abnormal   Collection Time: 09/19/19  1:02 AM  Result Value Ref Range   Sodium 139 135 - 145 mmol/L   Potassium 3.8 3.5 - 5.1 mmol/L   Chloride 102 98 - 111 mmol/L   CO2 21 (L) 22 - 32 mmol/L   Glucose, Bld 149 (H) 70 - 99 mg/dL    Comment: Glucose reference range applies only to samples taken after fasting for at least 8 hours.   BUN 18 6 - 20 mg/dL   Creatinine, Ser 0.73 0.44 - 1.00 mg/dL   Calcium 10.3 8.9 - 10.3 mg/dL   Total Protein 9.1 (H) 6.5 - 8.1 g/dL   Albumin 5.2 (H) 3.5 - 5.0 g/dL   AST 29 15 - 41 U/L   ALT 20 0 - 44 U/L   Alkaline Phosphatase 81 38 - 126 U/L   Total Bilirubin 0.6 0.3 - 1.2 mg/dL   GFR calc non Af Amer >60 >60 mL/min   GFR calc Af Amer >60 >60 mL/min   Anion gap 16 (H) 5 - 15    Comment:  Performed at [facility], [place] [address], [place], [place] [address]  hCG, quantitative, pregnancy     Status: None   Collection Time: 09/19/19  1:02 AM  Result Value Ref Range   hCG, Beta Chain, Quant, S 1 <5 mIU/mL    Comment:          GEST. AGE      CONC.  (mIU/mL)   <=1 WEEK        5 - 50     2 WEEKS       50 - 500     3 WEEKS       100 - 10,000  4 WEEKS     1,000 - 30,000     5 WEEKS     3,500 - 115,000   6-8 WEEKS     12,000 - 270,000    12 WEEKS     15,000 - 220,000        FEMALE AND NON-PREGNANT FEMALE:     LESS THAN 5 mIU/mL Performed at [facility], [place] [address], [place], [place] [address]   Urinalysis, Routine w reflex microscopic Urine, Clean Catch     Status: Abnormal   Collection Time: 09/19/19  7:46 AM  Result Value Ref Range   Color, Urine AMBER (A) YELLOW    Comment: BIOCHEMICALS MAY BE AFFECTED BY COLOR   APPearance HAZY (A) CLEAR   Specific Gravity, Urine 1.027 1.005 - 1.030   pH 5.0 5.0 - 8.0   Glucose, UA NEGATIVE NEGATIVE mg/dL   Hgb urine dipstick NEGATIVE NEGATIVE   Bilirubin Urine NEGATIVE NEGATIVE   Ketones, ur NEGATIVE NEGATIVE mg/dL   Protein, ur 30 (A) NEGATIVE mg/dL   Nitrite NEGATIVE NEGATIVE   Leukocytes,Ua NEGATIVE NEGATIVE   RBC / HPF 0-5 0 - 5 RBC/hpf   WBC, UA 0-5 0 - 5 WBC/hpf   Bacteria, UA FEW (A) NONE SEEN   Squamous Epithelial / LPF 6-10 0 - 5   Mucus PRESENT    Hyaline Casts, UA PRESENT     Comment: Performed at [facility], [place] [address], [place], [place] [address]   CT Abdomen Pelvis Wo Contrast  Result Date: 09/19/2019 CLINICAL DATA:  Nausea and vomiting. EXAM: CT ABDOMEN AND PELVIS WITHOUT CONTRAST TECHNIQUE: Multidetector CT imaging of the abdomen and pelvis was performed following the standard protocol without IV contrast. COMPARISON:  August 08, 2017 FINDINGS: Evaluation is limited secondary to lack of IV contrast. Lower chest: No acute abnormality.  Bilateral  breast implants. Hepatobiliary: Status post cholecystectomy. Unremarkable noncontrast appearance of the liver. Revisualization of dilation of the common bile duct to 12 mm, similar in comparison to prior and likely due to post cholecystectomy state. Pancreas: Unremarkable noncontrast appearance of the pancreas. Spleen: Normal in size without focal abnormality. Adrenals/Urinary Tract: Adrenal glands are unremarkable. Kidneys are normal, without renal calculi or hydronephrosis. Bladder is unremarkable. Stomach/Bowel: Stomach is unremarkable. There is a single dilated loop of small bowel in the LEFT hemiabdomen without upstream dilation. It measures 3.1 cm in diameter. It is upstream of surgical sutures in the mid abdomen (series 3, image 54). There is an apparent short segment narrowing which spans 2.9 cm at the level of the sutures although this could be artifactual due to peristalsis. Mildly prominent small bowel loops in the abdomen. Appendix is unremarkable. Moderate colonic stool burden. Vascular/Lymphatic: Scattered atherosclerotic calcifications. No suspicious lymphadenopathy visualized within the limitations of this noncontrast examination. Reproductive: Status post hysterectomy. Other: Tiny periumbilical fat containing hernia. Musculoskeletal: Status post posterior fusion and laminectomy of L4-5 and fusion of the RIGHT sacroiliac joint. IMPRESSION: 1. There is a single dilated loop of small bowel in the LEFT hemiabdomen without upstream dilation. It is upstream of surgical sutures in the mid abdomen. There is an apparent short segment narrowing which spans 2.9 cm at the level of the sutures although this could be artifactual due to peristalsis. Dilation of this loop of small bowel could be due to stenosis versus early bowel obstruction. 2. Mildly prominent small bowel loops in the abdomen. Findings may represent ileus or enteritis. 3. Revisualization of dilation of the common bile  duct to 12 mm, similar in  comparison to prior and likely due to post cholecystectomy state. Electronically Signed   By: Valentino Saxon MD   On: 09/19/2019 09:41    Anti-infectives (From admission, onward)   None       Assessment/Plan Early SBO vs ileus vs enteritis - CT w/ single dilated loop of small bowel in the LEFT hemiabdomen without upstream dilation that could be an early SBO. Given patients symptoms have resolved, feel it is reasonable to PO challenge. If patient passes can be d/c'd from our standpoint. If patient develops abdominal pain, distension, nausea or emesis will plan for NGT, SBOP and admit to inpatient. I will discuss with patients RN and EDP.   Jillyn Ledger, [facility] 09/19/2019, 12:16 PM Please see Amion for pager number during day hours 7:00am-4:30pm

## 2020-12-09 ENCOUNTER — Ambulatory Visit
Admission: EM | Admit: 2020-12-09 | Discharge: 2020-12-09 | Disposition: A | Discharge status: Home / Self Care | Payer: Medicaid Other

## 2020-12-09 ENCOUNTER — Encounter: Payer: Self-pay | Admitting: Physician Assistant

## 2020-12-09 ENCOUNTER — Other Ambulatory Visit: Payer: Self-pay

## 2020-12-09 DIAGNOSIS — S61212A Laceration without foreign body of right middle finger without damage to nail, initial encounter: Secondary | ICD-10-CM | POA: Diagnosis not present

## 2020-12-09 MED ORDER — DOXYCYCLINE HYCLATE 100 MG PO CAPS: 100.0000 mg | ORAL_CAPSULE | Freq: Two times a day (BID) | ORAL | 0 refills | Status: DC

## 2020-12-09 NOTE — Discharge Instructions (Signed)
Keep area clean - follow up in ED if no improvement or if symptoms worsen in any way.

## 2020-12-09 NOTE — ED Triage Notes (Signed)
Patient states that she cut her right fingers, middle and fourth fingers on a piece of glass Friday.  The middle finger will not straighten out now, redness and "no feeling".

## 2020-12-09 NOTE — ED Provider Notes (Signed)
[facility]    CSN: [id number] Arrival date & time: 12/09/20  1049      History   Chief Complaint Chief Complaint  Patient presents with   Laceration    HPI [name] is a 45 y.o. female.   Patient here today for evaluation of a laceration to her right middle finger that occurred 6 days ago.  She reports that she punched through glass and cut her middle finger and fourth finger in the process.  She is concerned at this point because swelling seems to be worsening and she has difficulty moving her middle DIP joint.  She has not had fever or chills.  She does not report any treatment for symptoms.  The history is provided by the patient.   Past Medical History:  Diagnosis Date   AMA (advanced maternal age) multigravida 35+    Arthritis    Chronic back pain    Depression    no meds    Hx of eating disorder    Numbness    right leg   Substance abuse ([place])    since 2009    Patient Active Problem List   Diagnosis Date Noted   Paresthesia 01/11/2018   Pelvic pain 01/01/2018   Menorrhagia 01/01/2018   SBO (small bowel obstruction) ([place]) 08/09/2017   Tobacco abuse 08/09/2017   GERD (gastroesophageal reflux disease) 08/09/2017   Hypokalemia 08/09/2017   Leukocytosis 08/09/2017   Dehiscence of fascia s/p C/S 07/25/2017 08/09/2017   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 08/09/2017 08/09/2017   Substance abuse ([place])    Pregnancy 07/25/2017   Vaginal bleeding in pregnancy, second trimester 04/06/2017   IUGR (intrauterine growth restriction) 02/28/2015   S/P cesarean section 02/28/2015   Radiculopathy 06/04/2014   Low back pain 09/23/2012    Past Surgical History:  Procedure Laterality Date   ABDOMINAL HYSTERECTOMY Bilateral 01/01/2018   Procedure: HYSTERECTOMY ABDOMINAL, salpingectomy;  Surgeon: Molli Posey, MD;  Location: [facility];  Service: Gynecology;  Laterality: Bilateral;   BACK SURGERY     Fusion    BREAST SURGERY     augmentation   BUNIONECTOMY Bilateral    screws   CESAREAN SECTION N/A 02/28/2015   Procedure: CESAREAN SECTION;  Surgeon: Linda Hedges, DO;  Location: [facility];  Service: Obstetrics;  Laterality: N/A;   CESAREAN SECTION N/A 07/25/2017   Procedure: REPEAT CESAREAN SECTION;  Surgeon: Molli Posey, MD;  Location: [place] [place];  Service: Obstetrics;  Laterality: N/A;  Repeat edc 08/18/17 NKDA   CHOLECYSTECTOMY     DIAGNOSTIC LAPAROSCOPY     I fuse     second back surgery states this is the name of the procedure   LAPAROTOMY N/A 08/09/2017   Procedure: LAPAROSCOPY, LYSIS OF ADHESIONS, SMALL BOWEL RESECTION, FASCIAL CLOSURE, BILATERAL TAP BLOCK;  Surgeon: Michael Boston, MD;  Location: [facility];  Service: General;  Laterality: N/A;   LUMBAR FUSION     SMALL INTESTINE SURGERY  08/2017    OB History     Gravida  5   Para  3   Term  2   Preterm  1   AB  2   Living  3      SAB  1   IAB  1   Ectopic      Multiple  0   Live Births  3            Home Medications    Prior to Admission medications  Medication Sig Start Date End Date Taking? Authorizing Provider  Aspirin-Acetaminophen-Caffeine (GOODY HEADACHE PO) Take 1 Package by mouth as needed (pain, headache).    Yes [provider]  doxycycline (VIBRAMYCIN) 100 MG capsule Take 1 capsule (100 mg total) by mouth 2 (two) times daily. 12/09/20  Yes Francene Finders, PA-C  naproxen sodium (ALEVE) 220 MG tablet Take 220 mg by mouth daily as needed (pain).   Yes [provider]  VRAYLAR 3 MG capsule Take 3 mg by mouth daily. 12/09/20  Yes [provider]  ZUBSOLV 5.7-1.4 MG SUBL Place 1 tablet under the tongue 2 (two) times daily. 09/12/19  Yes [provider]  ARIPiprazole (ABILIFY) 5 MG tablet Take 5 mg by mouth at bedtime. 09/12/19   [provider]  omeprazole (PRILOSEC) 20 MG capsule Take 1 capsule (20 mg total) by mouth daily. 09/19/19   Charlesetta Shanks, MD   ondansetron (ZOFRAN ODT) 4 MG disintegrating tablet Take 1 tablet (4 mg total) by mouth every 4 (four) hours as needed for nausea or vomiting. 09/19/19   Charlesetta Shanks, MD    Family History Family History  Problem Relation Age of Onset   Hypertension Father    Arthritis Father    Lung cancer Father     Social History Social History   Tobacco Use   Smoking status: Former    Packs/day: 0.25    Years: 9.00    Pack years: 2.25    Types: Cigarettes    Quit date: 2017    Years since quitting: 5.8   Smokeless tobacco: Never  Vaping Use   Vaping Use: Never used  Substance Use Topics   Alcohol use: Yes    Comment: once every two months   Drug use: Not Currently    Comment: suboxone     Allergies   Chantix [varenicline]   Review of Systems Review of Systems  Constitutional:  Negative for chills and fever.  Eyes:  Negative for discharge and redness.  Gastrointestinal:  Negative for abdominal pain, nausea and vomiting.  Skin:  Positive for color change and wound.  Neurological:  Positive for numbness.    Physical Exam Triage Vital Signs ED Triage Vitals  Enc Vitals Group     BP      Pulse      Resp      Temp      Temp src      SpO2      Weight      Height      Head Circumference      Peak Flow      Pain Score      Pain Loc      Pain Edu?      Excl. in [place]?    No data found.  Updated Vital Signs BP (!) 101/51 (BP Location: Left Arm)   Pulse 72   Temp (!) 97.5 F (36.4 C) (Oral)   Resp 16   Ht 5\' 4"  (1.626 m)   Wt 126 lb (57.2 kg)   LMP 12/05/2017 Comment: continues to bleed since having baby  SpO2 95%   BMI 21.63 kg/m      Physical Exam Vitals and nursing note reviewed.  Constitutional:      General: She is not in acute distress.    Appearance: Normal appearance. She is not ill-appearing.  HENT:     Head: Normocephalic and atraumatic.  Eyes:     Conjunctiva/sclera: Conjunctivae normal.  Cardiovascular:  Rate and Rhythm: Normal rate.   Pulmonary:     Effort: Pulmonary effort is normal.  Skin:    Comments: ~ 2 cm laceration to dorsal surface of right middle finger at DIP without active bleeding, surrounding erythema and swelling. ~ 1 cm laceration to 4th right dorsal finger without bleeding, minimal swelling.    Neurological:     Mental Status: She is alert.  Psychiatric:        Mood and Affect: Mood normal.        Behavior: Behavior normal.        Thought Content: Thought content normal.     UC Treatments / Results  Labs (all labs ordered are listed, but only abnormal results are displayed) Labs Reviewed - No data to display  EKG   Radiology No results found.  Procedures Procedures (including critical care time)  Medications Ordered in UC Medications - No data to display  Initial Impression / Assessment and Plan / UC Course  I have reviewed the triage vital signs and the nursing notes.  Pertinent labs & imaging results that were available during my care of the patient were reviewed by me and considered in my medical decision making (see chart for details).   Discussed that given length of time after injury, no indication for closure of wound. Will treat to cover possible secondary infection with doxycycline, but strongly recommend further evaluation in the ED if she continues to have swelling, difficulty moving right 3rd DIP. Patient expresses understanding.   Final Clinical Impressions(s) / UC Diagnoses   Final diagnoses:  Laceration of right middle finger without foreign body without damage to nail, initial encounter     Discharge Instructions      Keep area clean - follow up in ED if no improvement or if symptoms worsen in any way.      ED Prescriptions     Medication Sig Dispense Auth. Provider   doxycycline (VIBRAMYCIN) 100 MG capsule Take 1 capsule (100 mg total) by mouth 2 (two) times daily. 20 capsule Francene Finders, PA-C      PDMP not reviewed this encounter.   Francene Finders, PA-C 12/09/20 1338

## 2020-12-14 ENCOUNTER — Telehealth: Payer: Medicaid Other | Admitting: Physician Assistant

## 2020-12-14 DIAGNOSIS — T7840XA Allergy, unspecified, initial encounter: Secondary | ICD-10-CM

## 2020-12-14 NOTE — Patient Instructions (Signed)
  Aerie A Kaley, thank you for joining Leeanne Rio, PA-C for today's virtual visit.  While this provider is not your primary care provider (PCP), if your PCP is located in our provider database this encounter information will be shared with them immediately following your visit.  Consent: (Patient) [name] provided verbal consent for this virtual visit at the beginning of the encounter.  Current Medications:  Current Outpatient Medications:    ARIPiprazole (ABILIFY) 5 MG tablet, Take 5 mg by mouth at bedtime., Disp: , Rfl:    Aspirin-Acetaminophen-Caffeine (GOODY HEADACHE PO), Take 1 Package by mouth as needed (pain, headache). , Disp: , Rfl:    doxycycline (VIBRAMYCIN) 100 MG capsule, Take 1 capsule (100 mg total) by mouth 2 (two) times daily., Disp: 20 capsule, Rfl: 0   naproxen sodium (ALEVE) 220 MG tablet, Take 220 mg by mouth daily as needed (pain)., Disp: , Rfl:    omeprazole (PRILOSEC) 20 MG capsule, Take 1 capsule (20 mg total) by mouth daily., Disp: 30 capsule, Rfl: 0   ondansetron (ZOFRAN ODT) 4 MG disintegrating tablet, Take 1 tablet (4 mg total) by mouth every 4 (four) hours as needed for nausea or vomiting., Disp: 20 tablet, Rfl: 0   VRAYLAR 3 MG capsule, Take 3 mg by mouth daily., Disp: , Rfl:    ZUBSOLV 5.7-1.4 MG SUBL, Place 1 tablet under the tongue 2 (two) times daily., Disp: , Rfl:    Medications ordered in this encounter:  No orders of the defined types were placed in this encounter.    *If you need refills on other medications prior to your next appointment, please contact your pharmacy*  Follow-Up: Call back or seek an in-person evaluation if the symptoms worsen or if the condition fails to improve as anticipated.  Other Instructions Stop the Doxycycline. Continue use of Benadryl as needed for itch. Take the steroid as directed to help calm this response down more quickly. I have sent in a script for Keflex for you to take twice daily in place of  the Doxycycline   If you have been instructed to have an in-person evaluation today at a local Urgent Care facility, please use the link below. It will take you to a list of all of our available [facility], including address, phone number and hours of operation. Please do not delay care.  [facility]  If you or a family member do not have a primary care provider, use the link below to schedule a visit and establish care. When you choose a [facility] primary care physician or advanced practice provider, you gain a long-term partner in health. Find a Primary Care Provider  Learn more about [facility] in-office and virtual care options: [facility]

## 2020-12-14 NOTE — Progress Notes (Signed)
Virtual Visit Consent   [name], you are scheduled for a virtual visit with a [facility] provider today.     Just as with appointments in the office, your consent must be obtained to participate.  Your consent will be active for this visit and any virtual visit you may have with one of our providers in the next 365 days.     If you have a MyChart account, a copy of this consent can be sent to you electronically.  All virtual visits are billed to your insurance company just like a traditional visit in the office.    As this is a virtual visit, video technology does not allow for your provider to perform a traditional examination.  This may limit your provider's ability to fully assess your condition.  If your provider identifies any concerns that need to be evaluated in person or the need to arrange testing (such as labs, EKG, etc.), we will make arrangements to do so.     Although advances in technology are sophisticated, we cannot ensure that it will always work on either your end or our end.  If the connection with a video visit is poor, the visit may have to be switched to a telephone visit.  With either a video or telephone visit, we are not always able to ensure that we have a secure connection.     I need to obtain your verbal consent now.   Are you willing to proceed with your visit today?    [name] has provided verbal consent on 12/14/2020 for a virtual visit (video or telephone).   Leeanne Rio, [place]   Date: 12/14/2020 2:45 PM   Virtual Visit via Video Note   I, Leeanne Rio, connected with  [name]  ([id number], March 20, 1975) on 12/14/20 at  2:45 PM EST by a video-enabled telemedicine application and verified that I am speaking with the correct person using two identifiers.  Location: Patient: Virtual Visit Location Patient: Home Provider: Virtual Visit Location Provider: Home Office   I discussed the limitations of evaluation and  management by telemedicine and the availability of in person appointments. The patient expressed understanding and agreed to proceed.    History of Present Illness: [name] is a 45 y.o. who identifies as a female who was assigned female at birth, and is being seen today for possible allergic reaction. Was seen at local Urgent care on 12/09/2020 for laceration of R middle finger. At time was outside the window for closure so wound care instructions provided and patient started on Doxycycline which she did not start until 11/4 PM. Notes taking antibiotic as directed but noted mild itching after first dose that has worsened daily. No with itching in the mouth but denies any shortness of breath or tongue swelling. Notes the wound is doing well, denying any pain, redness, drainage. Is keeping clean and dry. Denies fever, chills, malaise or fatigue.  HPI: HPI  Problems:  Patient Active Problem List   Diagnosis Date Noted   Paresthesia 01/11/2018   Pelvic pain 01/01/2018   Menorrhagia 01/01/2018   SBO (small bowel obstruction) ([place]) 08/09/2017   Tobacco abuse 08/09/2017   GERD (gastroesophageal reflux disease) 08/09/2017   Hypokalemia 08/09/2017   Leukocytosis 08/09/2017   Dehiscence of fascia s/p C/S 07/25/2017 08/09/2017   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 08/09/2017 08/09/2017   Substance abuse ([place])    Pregnancy 07/25/2017   Vaginal bleeding in  pregnancy, second trimester 04/06/2017   IUGR (intrauterine growth restriction) 02/28/2015   S/P cesarean section 02/28/2015   Radiculopathy 06/04/2014   Low back pain 09/23/2012    Allergies:  Allergies  Allergen Reactions   Chantix [Varenicline] Hives   Doxycycline Itching and Swelling   Medications:  Current Outpatient Medications:    ARIPiprazole (ABILIFY) 5 MG tablet, Take 5 mg by mouth at bedtime., Disp: , Rfl:    Aspirin-Acetaminophen-Caffeine (GOODY HEADACHE PO), Take 1 Package by mouth as needed (pain,  headache). , Disp: , Rfl:    naproxen sodium (ALEVE) 220 MG tablet, Take 220 mg by mouth daily as needed (pain)., Disp: , Rfl:    omeprazole (PRILOSEC) 20 MG capsule, Take 1 capsule (20 mg total) by mouth daily., Disp: 30 capsule, Rfl: 0   ondansetron (ZOFRAN ODT) 4 MG disintegrating tablet, Take 1 tablet (4 mg total) by mouth every 4 (four) hours as needed for nausea or vomiting., Disp: 20 tablet, Rfl: 0   VRAYLAR 3 MG capsule, Take 3 mg by mouth daily., Disp: , Rfl:    ZUBSOLV 5.7-1.4 MG SUBL, Place 1 tablet under the tongue 2 (two) times daily., Disp: , Rfl:   Observations/Objective: Patient is well-developed, well-nourished in no acute distress.  Resting comfortably at home.  Head is normocephalic, atraumatic.  No labored breathing. Speech is clear and coherent with logical content.  Patient is alert and oriented at baseline.   Assessment and Plan: 1. Allergic reaction, initial encounter Stop Doxycycline. Continue Benadryl OTC as needed. Will add on short course of prednisone to further resolve reaction. Will start Keflex 500 mg BID in place of the Doxycycline. Continue wound care for laceration as directed by urgent care provider. Strict ER precautions reviewed with patient.   Follow Up Instructions: I discussed the assessment and treatment plan with the patient. The patient was provided an opportunity to ask questions and all were answered. The patient agreed with the plan and demonstrated an understanding of the instructions.  A copy of instructions were sent to the patient via MyChart unless otherwise noted below.   The patient was advised to call back or seek an in-person evaluation if the symptoms worsen or if the condition fails to improve as anticipated.  Time:  I spent 11 minutes with the patient via telehealth technology discussing the above problems/concerns.    Leeanne Rio, PA-C

## 2020-12-15 ENCOUNTER — Encounter: Payer: Self-pay | Admitting: Physician Assistant

## 2020-12-15 ENCOUNTER — Other Ambulatory Visit: Payer: Self-pay | Admitting: Physician Assistant

## 2020-12-15 MED ORDER — CEPHALEXIN 500 MG PO CAPS: 500.0000 mg | ORAL_CAPSULE | Freq: Two times a day (BID) | ORAL | 0 refills | Status: AC

## 2020-12-15 MED ORDER — PREDNISONE 10 MG PO TABS: ORAL_TABLET | ORAL | 0 refills | Status: AC

## 2020-12-21 ENCOUNTER — Other Ambulatory Visit: Payer: Self-pay

## 2020-12-21 ENCOUNTER — Emergency Department (HOSPITAL_COMMUNITY): Payer: Medicaid Other

## 2020-12-21 ENCOUNTER — Emergency Department (HOSPITAL_COMMUNITY)
Admission: EM | Admit: 2020-12-21 | Discharge: 2020-12-21 | Disposition: A | Discharge status: Home / Self Care | Payer: Medicaid Other | Attending: Emergency Medicine | Admitting: Emergency Medicine

## 2020-12-21 ENCOUNTER — Emergency Department (HOSPITAL_COMMUNITY)
Admission: EM | Admit: 2020-12-21 | Discharge: 2020-12-22 | Disposition: A | Discharge status: Home / Self Care | Payer: Medicaid Other | Source: Home / Self Care | Attending: Emergency Medicine | Admitting: Emergency Medicine

## 2020-12-21 DIAGNOSIS — S61204D Unspecified open wound of right ring finger without damage to nail, subsequent encounter: Secondary | ICD-10-CM | POA: Insufficient documentation

## 2020-12-21 DIAGNOSIS — Z87891 Personal history of nicotine dependence: Secondary | ICD-10-CM | POA: Insufficient documentation

## 2020-12-21 DIAGNOSIS — S61209D Unspecified open wound of unspecified finger without damage to nail, subsequent encounter: Secondary | ICD-10-CM

## 2020-12-21 DIAGNOSIS — W25XXXD Contact with sharp glass, subsequent encounter: Secondary | ICD-10-CM | POA: Diagnosis not present

## 2020-12-21 DIAGNOSIS — S61202D Unspecified open wound of right middle finger without damage to nail, subsequent encounter: Secondary | ICD-10-CM | POA: Diagnosis present

## 2020-12-21 DIAGNOSIS — X58XXXD Exposure to other specified factors, subsequent encounter: Secondary | ICD-10-CM | POA: Insufficient documentation

## 2020-12-21 DIAGNOSIS — R52 Pain, unspecified: Secondary | ICD-10-CM

## 2020-12-21 LAB — URINALYSIS, ROUTINE W REFLEX MICROSCOPIC
Bacteria, UA: NONE SEEN
Bilirubin Urine: NEGATIVE
Glucose, UA: NEGATIVE mg/dL
Hgb urine dipstick: NEGATIVE
Ketones, ur: NEGATIVE mg/dL
Nitrite: NEGATIVE
Protein, ur: NEGATIVE mg/dL
Specific Gravity, Urine: 1.017 (ref 1.005–1.030)
pH: 5 (ref 5.0–8.0)

## 2020-12-21 LAB — CBC WITH DIFFERENTIAL/PLATELET
Abs Immature Granulocytes: 0.13 10*3/uL — ABNORMAL HIGH (ref 0.00–0.07)
Basophils Absolute: 0.1 10*3/uL (ref 0.0–0.1)
Basophils Relative: 1 %
Eosinophils Absolute: 0.2 10*3/uL (ref 0.0–0.5)
Eosinophils Relative: 2 %
HCT: 42.3 % (ref 36.0–46.0)
Hemoglobin: 13.4 g/dL (ref 12.0–15.0)
Immature Granulocytes: 1 %
Lymphocytes Relative: 27 %
Lymphs Abs: 3.5 10*3/uL (ref 0.7–4.0)
MCH: 30.1 pg (ref 26.0–34.0)
MCHC: 31.7 g/dL (ref 30.0–36.0)
MCV: 95.1 fL (ref 80.0–100.0)
Monocytes Absolute: 0.6 10*3/uL (ref 0.1–1.0)
Monocytes Relative: 5 %
Neutro Abs: 8.4 10*3/uL — ABNORMAL HIGH (ref 1.7–7.7)
Neutrophils Relative %: 64 %
Platelets: 278 10*3/uL (ref 150–400)
RBC: 4.45 MIL/uL (ref 3.87–5.11)
RDW: 13.8 % (ref 11.5–15.5)
WBC: 13 10*3/uL — ABNORMAL HIGH (ref 4.0–10.5)
nRBC: 0 % (ref 0.0–0.2)

## 2020-12-21 LAB — LACTIC ACID, PLASMA: Lactic Acid, Venous: 0.8 mmol/L (ref 0.5–1.9)

## 2020-12-21 LAB — COMPREHENSIVE METABOLIC PANEL WITH GFR
ALT: 16 U/L (ref 0–44)
AST: 23 U/L (ref 15–41)
Albumin: 3.3 g/dL — ABNORMAL LOW (ref 3.5–5.0)
Alkaline Phosphatase: 49 U/L (ref 38–126)
Anion gap: 11 (ref 5–15)
BUN: 14 mg/dL (ref 6–20)
CO2: 21 mmol/L — ABNORMAL LOW (ref 22–32)
Calcium: 8.5 mg/dL — ABNORMAL LOW (ref 8.9–10.3)
Chloride: 106 mmol/L (ref 98–111)
Creatinine, Ser: 0.54 mg/dL (ref 0.44–1.00)
GFR, Estimated: >60 mL/min
Glucose, Bld: 77 mg/dL (ref 70–99)
Potassium: 3.7 mmol/L (ref 3.5–5.1)
Sodium: 138 mmol/L (ref 135–145)
Total Bilirubin: 0.2 mg/dL — ABNORMAL LOW (ref 0.3–1.2)
Total Protein: 6.1 g/dL — ABNORMAL LOW (ref 6.5–8.1)

## 2020-12-21 LAB — I-STAT BETA HCG BLOOD, ED (MC, WL, AP ONLY): I-stat hCG, quantitative: <5 m[IU]/mL (ref <5)

## 2020-12-21 NOTE — ED Provider Notes (Signed)
Emergency Medicine Provider Triage Evaluation Note  [name] , a 45 y.o. female  was evaluated in triage.  Pt complains of right third digit injury with infection.  Patient states that she initially had cut her left third and fourth digits on glass about 2 weeks ago.  She had not sought medical attention.  However she did go to urgent care last week and was placed on doxycycline, but states that she had a reaction so she was switched to a different antibiotic that she cannot remember the name of.  She states that she still has a few days left of that course, but is concerned because her finger looks more swollen now she is having difficulty bending it.  Review of Systems  Positive: Right middle and ring finger injuries, redness, tenderness, swelling Negative: Numbness, tingling, fevers, chills  Physical Exam  BP 126/77 (BP Location: Right Arm)   Pulse 76   Temp 97.8 F (36.6 C)   Resp 16   LMP 12/05/2017 Comment: continues to bleed since having baby  SpO2 100%  Gen:   Awake, no distress   Resp:  Normal effort  MSK:   Moves extremities without difficulty  Other:  Right third and fourth digit partially healed lacerations erythema with tenderness and swelling at the PIP joints with decreased flexion due to pain, good capillary refill, sensation intact  Medical Decision Making  Medically screening exam initiated at 12:45 PM.  Appropriate orders placed.  [name] was informed that the remainder of the evaluation will be completed by another provider, this initial triage assessment does not replace that evaluation, and the importance of remaining in the ED until their evaluation is complete.     Estill Cotta 12/21/20 1247    Valarie Merino, MD 12/25/20 [phone]

## 2020-12-21 NOTE — ED Triage Notes (Signed)
Pt here for re-eval of R middle finger injury. Seen for same here after injury from punching glass. Started doxycycline but had rxn, so was started on another abx, of which she cannot remember the name. Still has a few days left of that course, but is concerned because it still looks infected.

## 2020-12-21 NOTE — ED Notes (Signed)
Pt had to leave to pick up child, will come back later.

## 2020-12-22 ENCOUNTER — Encounter (HOSPITAL_COMMUNITY): Payer: Self-pay | Admitting: Emergency Medicine

## 2020-12-22 ENCOUNTER — Other Ambulatory Visit: Payer: Self-pay

## 2020-12-22 MED ORDER — SULFAMETHOXAZOLE-TRIMETHOPRIM 800-160 MG PO TABS: 1.0000 | ORAL_TABLET | Freq: Two times a day (BID) | ORAL | 0 refills | Status: AC

## 2020-12-22 MED ORDER — SULFAMETHOXAZOLE-TRIMETHOPRIM 800-160 MG PO TABS
1.0000 | ORAL_TABLET | Freq: Once | ORAL | Status: AC
  Administered 2020-12-22: 1 via ORAL
  Filled 2020-12-22: qty 1

## 2020-12-22 NOTE — ED Triage Notes (Signed)
Pt was here earlier for eval of finger infection. Had been seen at Urgent care and given anbx and was told if not better in 2 weeks come to the ED.  Pt had to leave earlier to pick up child but is now back to be seen. Already had imaging earlier.  Dr. Dayna Barker in to [facility]

## 2020-12-22 NOTE — ED Provider Notes (Signed)
[facility] EMERGENCY DEPARTMENT Provider Note   CSN: [id number] Arrival date & time: 12/21/20  2305     History No chief complaint on file.   [name] is a 45 y.o. female.  45 year old female who went to urgent care about a week and a half ago after having had some lacerations a few days prior to that.  She was started on doxycycline.  She could not tolerate it so was switched over to Keflex and has a couple days left of that.  She presents emerged part today secondary to persistent swelling and of her right third finger over the distal PIP.  No significant pain however she does have difficulty extending it fully.       Past Medical History:  Diagnosis Date   AMA (advanced maternal age) multigravida 35+    Arthritis    Chronic back pain    Depression    no meds    Hx of eating disorder    Numbness    right leg   Substance abuse ([place])    since 2009    Patient Active Problem List   Diagnosis Date Noted   Paresthesia 01/11/2018   Pelvic pain 01/01/2018   Menorrhagia 01/01/2018   SBO (small bowel obstruction) ([place]) 08/09/2017   Tobacco abuse 08/09/2017   GERD (gastroesophageal reflux disease) 08/09/2017   Hypokalemia 08/09/2017   Leukocytosis 08/09/2017   Dehiscence of fascia s/p C/S 07/25/2017 08/09/2017   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 08/09/2017 08/09/2017   Substance abuse ([place])    Pregnancy 07/25/2017   Vaginal bleeding in pregnancy, second trimester 04/06/2017   IUGR (intrauterine growth restriction) 02/28/2015   S/P cesarean section 02/28/2015   Radiculopathy 06/04/2014   Low back pain 09/23/2012    Past Surgical History:  Procedure Laterality Date   ABDOMINAL HYSTERECTOMY Bilateral 01/01/2018   Procedure: HYSTERECTOMY ABDOMINAL, salpingectomy;  Surgeon: Molli Posey, MD;  Location: [facility];  Service: Gynecology;  Laterality: Bilateral;   BACK SURGERY     Fusion   BREAST SURGERY      augmentation   BUNIONECTOMY Bilateral    screws   CESAREAN SECTION N/A 02/28/2015   Procedure: CESAREAN SECTION;  Surgeon: Linda Hedges, DO;  Location: [facility];  Service: Obstetrics;  Laterality: N/A;   CESAREAN SECTION N/A 07/25/2017   Procedure: REPEAT CESAREAN SECTION;  Surgeon: Molli Posey, MD;  Location: [facility];  Service: Obstetrics;  Laterality: N/A;  Repeat edc 08/18/17 NKDA   CHOLECYSTECTOMY     DIAGNOSTIC LAPAROSCOPY     I fuse     second back surgery states this is the name of the procedure   LAPAROTOMY N/A 08/09/2017   Procedure: LAPAROSCOPY, LYSIS OF ADHESIONS, SMALL BOWEL RESECTION, FASCIAL CLOSURE, BILATERAL TAP BLOCK;  Surgeon: Michael Boston, MD;  Location: [facility];  Service: General;  Laterality: N/A;   LUMBAR FUSION     SMALL INTESTINE SURGERY  08/2017     OB History     Gravida  5   Para  3   Term  2   Preterm  1   AB  2   Living  3      SAB  1   IAB  1   Ectopic      Multiple  0   Live Births  3           Family History  Problem Relation Age of Onset   Hypertension Father  Arthritis Father    Lung cancer Father     Social History   Tobacco Use   Smoking status: Former    Packs/day: 0.25    Years: 9.00    Pack years: 2.25    Types: Cigarettes    Quit date: 2017    Years since quitting: 5.8   Smokeless tobacco: Never  Vaping Use   Vaping Use: Never used  Substance Use Topics   Alcohol use: Yes    Comment: once every two months   Drug use: Not Currently    Comment: suboxone    Home Medications Prior to Admission medications   Medication Sig Start Date End Date Taking? Authorizing Provider  sulfamethoxazole-trimethoprim (BACTRIM DS) 800-160 MG tablet Take 1 tablet by mouth 2 (two) times daily for 7 days. 12/22/20 12/29/20 Yes Rekisha Welling, Corene Cornea, MD  ARIPiprazole (ABILIFY) 5 MG tablet Take 5 mg by mouth at bedtime. 09/12/19   [provider]  Aspirin-Acetaminophen-Caffeine (GOODY HEADACHE PO) Take 1  Package by mouth as needed (pain, headache).     [provider]  cephALEXin (KEFLEX) 500 MG capsule Take 1 capsule (500 mg total) by mouth 2 (two) times daily for 7 days. 12/15/20 12/22/20  Brunetta Jeans, PA-C  naproxen sodium (ALEVE) 220 MG tablet Take 220 mg by mouth daily as needed (pain).    [provider]  omeprazole (PRILOSEC) 20 MG capsule Take 1 capsule (20 mg total) by mouth daily. 09/19/19   Charlesetta Shanks, MD  ondansetron (ZOFRAN ODT) 4 MG disintegrating tablet Take 1 tablet (4 mg total) by mouth every 4 (four) hours as needed for nausea or vomiting. 09/19/19   Charlesetta Shanks, MD  predniSONE (DELTASONE) 10 MG tablet Take 4 tablets (40 mg total) by mouth daily with breakfast for 3 days, THEN 3 tablets (30 mg total) daily with breakfast for 3 days, THEN 2 tablets (20 mg total) daily with breakfast for 3 days, THEN 1 tablet (10 mg total) daily with breakfast for 3 days. 12/15/20 12/27/20  Brunetta Jeans, PA-C  SUBOXONE 8-2 MG FILM Place under the tongue 3 (three) times daily. 12/08/20   [provider]  Vilazodone HCl (VIIBRYD) 40 MG TABS Take 40 mg by mouth every morning. 12/07/20   [provider]  VRAYLAR 3 MG capsule Take 3 mg by mouth daily. 12/09/20   [provider]  ZUBSOLV 5.7-1.4 MG SUBL Place 1 tablet under the tongue 2 (two) times daily. 09/12/19   [provider]    Allergies    Chantix [varenicline] and Doxycycline  Review of Systems   Review of Systems  All other systems reviewed and are negative.  Physical Exam Updated Vital Signs BP (!) 98/57 (BP Location: Right Arm)   Pulse 80   Temp 97.8 F (36.6 C) (Oral)   Resp 16   Ht 5\' 4"  (1.626 m)   Wt 58.5 kg   LMP 12/05/2017 Comment: continues to bleed since having baby  SpO2 100%   BMI 22.14 kg/m   Physical Exam Vitals and nursing note reviewed.  Constitutional:      Appearance: She is well-developed.  HENT:     Head: Normocephalic and atraumatic.      Mouth/Throat:     Mouth: Mucous membranes are moist.     Pharynx: Oropharynx is clear.  Eyes:     Pupils: Pupils are equal, round, and reactive to light.  Cardiovascular:     Rate and Rhythm: Normal rate and regular rhythm.  Pulmonary:  Effort: No respiratory distress.     Breath sounds: No stridor.  Abdominal:     General: Abdomen is flat. There is no distension.  Musculoskeletal:        General: Swelling and deformity present. Normal range of motion.     Cervical back: Normal range of motion.     Comments: Right third digit over the distal PIP has some erythema, open wound.  Mildly fluctuant but not significantly so.  More importantly she cannot extend the digit she has no strength with attempted extension.  Skin:    General: Skin is warm and dry.  Neurological:     General: No focal deficit present.     Mental Status: She is alert.    ED Results / Procedures / Treatments   Labs (all labs ordered are listed, but only abnormal results are displayed) Labs Reviewed - No data to display  EKG None  Radiology DG Chest 2 View  Result Date: 12/21/2020 CLINICAL DATA:  concern for osteo EXAM: CHEST - 2 VIEW COMPARISON:  May 28, 2014. FINDINGS: The heart size and mediastinal contours are within normal limits. Both lungs are clear. No visible pleural effusions or pneumothorax. Biapical pleuroparenchymal scarring. No acute osseous abnormality. IMPRESSION: No evidence of acute cardiopulmonary disease. Electronically Signed   By: Margaretha Sheffield M.D.   On: 12/21/2020 13:08   DG Hand Complete Right  Result Date: 12/21/2020 CLINICAL DATA:  Right hand injury after breaking glass. EXAM: RIGHT HAND - COMPLETE 3+ VIEW COMPARISON:  None. FINDINGS: There is no evidence of fracture or dislocation. There is no evidence of arthropathy or other focal bone abnormality. Soft tissues are unremarkable. IMPRESSION: Negative. Electronically Signed   By: Marijo Conception M.D.   On: 12/21/2020 13:09     Procedures Procedures   Medications Ordered in ED Medications  sulfamethoxazole-trimethoprim (BACTRIM DS) 800-160 MG per tablet 1 tablet (1 tablet Oral Given 12/22/20 0012)    ED Course  I have reviewed the triage vital signs and the nursing notes.  Pertinent labs & imaging results that were available during my care of the patient were reviewed by me and considered in my medical decision making (see chart for details).    MDM Rules/Calculators/A&P                         I am not sure that she has persistent infection.  I suspect that she has a extensor tendon injury.  Has been well over a week since that happened and not sure anything be done about it but we refer her to hand surgery for follow-up of the same. Will start a different course of antibiotics in mean time.   Final Clinical Impression(s) / ED Diagnoses Final diagnoses:  Open wound of finger with tendon injury, subsequent encounter    Rx / DC Orders ED Discharge Orders          Ordered    sulfamethoxazole-trimethoprim (BACTRIM DS) 800-160 MG tablet  2 times daily        12/22/20 0008             Dayanis Bergquist, Corene Cornea, MD 12/22/20 [phone]

## 2020-12-22 NOTE — ED Notes (Signed)
Patient verbalizes understanding of discharge instructions. Prescriptions and follow-up care reviewed. Opportunity for questioning and answers were provided. Armband removed by staff, pt discharged from ED ambulatory.  

## 2020-12-26 LAB — CULTURE, BLOOD (ROUTINE X 2)
Culture: NO GROWTH
Culture: NO GROWTH
Special Requests: ADEQUATE
Special Requests: ADEQUATE

## 2021-12-21 ENCOUNTER — Encounter: Payer: Self-pay | Admitting: Internal Medicine

## 2022-05-17 ENCOUNTER — Telehealth: Payer: Medicaid Other | Admitting: Physician Assistant

## 2022-05-17 ENCOUNTER — Telehealth: Payer: Medicaid Other

## 2022-05-17 DIAGNOSIS — H66001 Acute suppurative otitis media without spontaneous rupture of ear drum, right ear: Secondary | ICD-10-CM | POA: Diagnosis not present

## 2022-05-17 MED ORDER — NEOMYCIN-POLYMYXIN-HC 3.5-10000-1 OT SOLN: 3.0000 [drp] | Freq: Four times a day (QID) | OTIC | 0 refills | Status: DC

## 2022-05-17 MED ORDER — FLUTICASONE PROPIONATE 50 MCG/ACT NA SUSP: 2.0000 | Freq: Every day | NASAL | 0 refills | Status: DC

## 2022-05-17 MED ORDER — AMOXICILLIN-POT CLAVULANATE 875-125 MG PO TABS: 1.0000 | ORAL_TABLET | Freq: Two times a day (BID) | ORAL | 0 refills | Status: DC

## 2022-05-17 NOTE — Progress Notes (Signed)
Virtual Visit Consent   [name], you are scheduled for a virtual visit with a [facility] provider today. Just as with appointments in the office, your consent must be obtained to participate. Your consent will be active for this visit and any virtual visit you may have with one of our providers in the next 365 days. If you have a MyChart account, a copy of this consent can be sent to you electronically.  As this is a virtual visit, video technology does not allow for your provider to perform a traditional examination. This may limit your provider's ability to fully assess your condition. If your provider identifies any concerns that need to be evaluated in person or the need to arrange testing (such as labs, EKG, etc.), we will make arrangements to do so. Although advances in technology are sophisticated, we cannot ensure that it will always work on either your end or our end. If the connection with a video visit is poor, the visit may have to be switched to a telephone visit. With either a video or telephone visit, we are not always able to ensure that we have a secure connection.  By engaging in this virtual visit, you consent to the provision of healthcare and authorize for your insurance to be billed (if applicable) for the services provided during this visit. Depending on your insurance coverage, you may receive a charge related to this service.  I need to obtain your verbal consent now. Are you willing to proceed with your visit today? [name] has provided verbal consent on 05/17/2022 for a virtual visit (video or telephone). [name], PA-C  Date: 05/17/2022 12:34 PM  Virtual Visit via Video Note   I, [name], connected with  [name]  ([id number], 08-17-2007) on 05/17/22 at 12:30 PM EDT by a video-enabled telemedicine application and verified that I am speaking with the correct person using two identifiers.  Location: Patient: Virtual Visit  Location Patient: Home Provider: Virtual Visit Location Provider: Home Office   I discussed the limitations of evaluation and management by telemedicine and the availability of in person appointments. The patient expressed understanding and agreed to proceed.    History of Present Illness: [name] is a 47 y.o. who identifies as a female who was assigned female at birth, and is being seen today for ear pain.  HPI: Otalgia  There is pain in the right ear. This is a new problem. The current episode started in the past 7 days (3 days). The problem occurs constantly. There has been no fever. Associated symptoms include headaches. Pertinent negatives include no coughing, diarrhea, ear discharge, hearing loss, neck pain, rhinorrhea, sore throat or vomiting. Associated symptoms comments: Had sinus pressure last week that cleared up. She has tried NSAIDs for the symptoms. The treatment provided no relief. There is no history of a chronic ear infection, hearing loss or a tympanostomy tube.     Problems:  Patient Active Problem List   Diagnosis Date Noted   Paresthesia 01/11/2018   Pelvic pain 01/01/2018   Menorrhagia 01/01/2018   SBO (small bowel obstruction) 08/09/2017   Tobacco abuse 08/09/2017   GERD (gastroesophageal reflux disease) 08/09/2017   Hypokalemia 08/09/2017   Leukocytosis 08/09/2017   Dehiscence of fascia s/p C/S 07/25/2017 08/09/2017   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 08/09/2017 08/09/2017   Substance abuse    Pregnancy 07/25/2017   Vaginal bleeding in pregnancy, second trimester 04/06/2017  IUGR (intrauterine growth restriction) 02/28/2015   S/P cesarean section 02/28/2015   Radiculopathy 06/04/2014   Low back pain 09/23/2012    Allergies:  Allergies  Allergen Reactions   Chantix [Varenicline] Hives   Doxycycline Itching and Swelling   Medications:  Current Outpatient Medications:    amoxicillin-clavulanate (AUGMENTIN) 875-125 MG tablet,  Take 1 tablet by mouth 2 (two) times daily., Disp: 20 tablet, Rfl: 0   fluticasone (FLONASE) 50 MCG/ACT nasal spray, Place 2 sprays into both nostrils daily., Disp: 16 g, Rfl: 0   neomycin-polymyxin-hydrocortisone (CORTISPORIN) OTIC solution, Place 3 drops into the right ear 4 (four) times daily. For 7 days, Disp: 10 mL, Rfl: 0   ARIPiprazole (ABILIFY) 5 MG tablet, Take 5 mg by mouth at bedtime., Disp: , Rfl:    Aspirin-Acetaminophen-Caffeine (GOODY HEADACHE PO), Take 1 Package by mouth as needed (pain, headache). , Disp: , Rfl:    naproxen sodium (ALEVE) 220 MG tablet, Take 220 mg by mouth daily as needed (pain)., Disp: , Rfl:    omeprazole (PRILOSEC) 20 MG capsule, Take 1 capsule (20 mg total) by mouth daily., Disp: 30 capsule, Rfl: 0   ondansetron (ZOFRAN ODT) 4 MG disintegrating tablet, Take 1 tablet (4 mg total) by mouth every 4 (four) hours as needed for nausea or vomiting., Disp: 20 tablet, Rfl: 0   SUBOXONE 8-2 MG FILM, Place under the tongue 3 (three) times daily., Disp: , Rfl:    Vilazodone HCl (VIIBRYD) 40 MG TABS, Take 40 mg by mouth every morning., Disp: , Rfl:    VRAYLAR 3 MG capsule, Take 3 mg by mouth daily., Disp: , Rfl:    ZUBSOLV 5.7-1.4 MG SUBL, Place 1 tablet under the tongue 2 (two) times daily., Disp: , Rfl:   Observations/Objective: Patient is well-developed, well-nourished in no acute distress.  Resting comfortably at home.  Head is normocephalic, atraumatic.  No labored breathing.  Speech is clear and coherent with logical content.  Patient is alert and oriented at baseline.    Assessment and Plan: 1. Non-recurrent acute suppurative otitis media of right ear without spontaneous rupture of tympanic membrane - amoxicillin-clavulanate (AUGMENTIN) 875-125 MG tablet; Take 1 tablet by mouth 2 (two) times daily.  Dispense: 20 tablet; Refill: 0 - neomycin-polymyxin-hydrocortisone (CORTISPORIN) OTIC solution; Place 3 drops into the right ear 4 (four) times daily. For 7 days   Dispense: 10 mL; Refill: 0 - fluticasone (FLONASE) 50 MCG/ACT nasal spray; Place 2 sprays into both nostrils daily.  Dispense: 16 g; Refill: 0  - Worsening symptoms that have not responded to OTC medications.  - Will give Augmentin and Cortisporin drops - Continue saline nasal rinses - Could consider to add Flonase (Fluticasone) nasal spray over the counter for possible eustachian tube dysfunction - Steam and humidifier can help - Warm compress to ear - Stay well hydrated and get plenty of rest.  - Seek in person evaluation if no symptom improvement or if symptoms worsen   Follow Up Instructions: I discussed the assessment and treatment plan with the patient. The patient was provided an opportunity to ask questions and all were answered. The patient agreed with the plan and demonstrated an understanding of the instructions.  A copy of instructions were sent to the patient via MyChart unless otherwise noted below.    The patient was advised to call back or seek an in-person evaluation if the symptoms worsen or if the condition fails to improve as anticipated.  Time:  I spent 8 minutes with the patient  via telehealth technology discussing the above problems/concerns.    Mar Daring, PA-C

## 2022-05-17 NOTE — Patient Instructions (Signed)
[name], thank you for joining Laura Loveless, PA-C for today's virtual visit.  While this provider is not your primary care provider (PCP), if your PCP is located in our provider database this encounter information will be shared with them immediately following your visit.   A [facility] MyChart account gives you access to today's visit and all your visits, tests, and labs performed at [facility] " click here if you don't have a [facility] MyChart account or go to [email or web address]  Consent: (Patient) [name] provided verbal consent for this virtual visit at the beginning of the encounter.  Current Medications:  Current Outpatient Medications:    amoxicillin-clavulanate (AUGMENTIN) 875-125 MG tablet, Take 1 tablet by mouth 2 (two) times daily., Disp: 20 tablet, Rfl: 0   fluticasone (FLONASE) 50 MCG/ACT nasal spray, Place 2 sprays into both nostrils daily., Disp: 16 g, Rfl: 0   neomycin-polymyxin-hydrocortisone (CORTISPORIN) OTIC solution, Place 3 drops into the right ear 4 (four) times daily. For 7 days, Disp: 10 mL, Rfl: 0   ARIPiprazole (ABILIFY) 5 MG tablet, Take 5 mg by mouth at bedtime., Disp: , Rfl:    Aspirin-Acetaminophen-Caffeine (GOODY HEADACHE PO), Take 1 Package by mouth as needed (pain, headache). , Disp: , Rfl:    naproxen sodium (ALEVE) 220 MG tablet, Take 220 mg by mouth daily as needed (pain)., Disp: , Rfl:    omeprazole (PRILOSEC) 20 MG capsule, Take 1 capsule (20 mg total) by mouth daily., Disp: 30 capsule, Rfl: 0   ondansetron (ZOFRAN ODT) 4 MG disintegrating tablet, Take 1 tablet (4 mg total) by mouth every 4 (four) hours as needed for nausea or vomiting., Disp: 20 tablet, Rfl: 0   SUBOXONE 8-2 MG FILM, Place under the tongue 3 (three) times daily., Disp: , Rfl:    Vilazodone HCl (VIIBRYD) 40 MG TABS, Take 40 mg by mouth every morning., Disp: , Rfl:    VRAYLAR 3 MG capsule, Take 3 mg by mouth daily., Disp: , Rfl:    ZUBSOLV  5.7-1.4 MG SUBL, Place 1 tablet under the tongue 2 (two) times daily., Disp: , Rfl:    Medications ordered in this encounter:  Meds ordered this encounter  Medications   amoxicillin-clavulanate (AUGMENTIN) 875-125 MG tablet    Sig: Take 1 tablet by mouth 2 (two) times daily.    Dispense:  20 tablet    Refill:  0    Order Specific Question:   Supervising Provider    Answer:   Merrilee Jansky [id number]   neomycin-polymyxin-hydrocortisone (CORTISPORIN) OTIC solution    Sig: Place 3 drops into the right ear 4 (four) times daily. For 7 days    Dispense:  10 mL    Refill:  0    Order Specific Question:   Supervising Provider    Answer:   Merrilee Jansky [[id number]]   fluticasone (FLONASE) 50 MCG/ACT nasal spray    Sig: Place 2 sprays into both nostrils daily.    Dispense:  16 g    Refill:  0    Order Specific Question:   Supervising Provider    Answer:   Merrilee Jansky [id number]     *If you need refills on other medications prior to your next appointment, please contact your pharmacy*  Follow-Up: Call back or seek an in-person evaluation if the symptoms worsen or if the condition fails to improve as anticipated.  [facility] [phone]  Other Instructions  Otitis Media, Adult  Otitis  media occurs when there is inflammation and fluid in the middle ear with signs and symptoms of an acute infection. The middle ear is a part of the ear that contains bones for hearing as well as air that helps send sounds to the brain. When infected fluid builds up in this space, it causes pressure and can lead to an ear infection. The eustachian tube connects the middle ear to the back of the nose (nasopharynx) and normally allows air into the middle ear. If the eustachian tube becomes blocked, fluid can build up and become infected. What are the causes? This condition is caused by a blockage in the eustachian tube. This can be caused by mucus or by swelling of the tube. Problems  that can cause a blockage include: A cold or other upper respiratory infection. Allergies. An irritant, such as tobacco smoke. Enlarged adenoids. The adenoids are areas of soft tissue located high in the back of the throat, behind the nose and the roof of the mouth. They are part of the body's defense system (immune system). A mass in the nasopharynx. Damage to the ear caused by pressure changes (barotrauma). What increases the risk? You are more likely to develop this condition if you: Smoke or are exposed to tobacco smoke. Have an opening in the roof of your mouth (cleft palate). Have gastroesophageal reflux. Have an immune system disorder. What are the signs or symptoms? Symptoms of this condition include: Ear pain. Fever. Decreased hearing. Tiredness (lethargy). Fluid leaking from the ear, if the eardrum is ruptured or has burst. Ringing in the ear. How is this diagnosed?  This condition is diagnosed with a physical exam. During the exam, your health care provider will use an instrument called an otoscope to look in your ear and check for redness, swelling, and fluid. He or she will also ask about your symptoms. Your health care provider may also order tests, such as: A pneumatic otoscopy. This is a test to check the movement of the eardrum. It is done by squeezing a small amount of air into the ear. A tympanogram. This is a test that shows how well the eardrum moves in response to air pressure in the ear canal. It provides a graph for your health care provider to review. How is this treated? This condition can go away on its own within 3-5 days. But if the condition is caused by a bacterial infection and does not go away on its own, or if it keeps coming back, your health care provider may: Prescribe antibiotic medicine to treat the infection. Prescribe or recommend medicines to control pain. Follow these instructions at home: Take over-the-counter and prescription medicines only  as told by your health care provider. If you were prescribed an antibiotic medicine, take it as told by your health care provider. Do not stop taking the antibiotic even if you start to feel better. Keep all follow-up visits. This is important. Contact a health care provider if: You have bleeding from your nose. There is a lump on your neck. You are not feeling better in 5 days. You feel worse instead of better. Get help right away if: You have severe pain that is not controlled with medicine. You have swelling, redness, or pain around your ear. You have stiffness in your neck. A part of your face is not moving (paralyzed). The bone behind your ear (mastoid bone) is tender when you touch it. You develop a severe headache. Summary Otitis media is redness, soreness, and  swelling of the middle ear, usually resulting in pain and decreased hearing. This condition can go away on its own within 3-5 days. If the problem does not go away in 3-5 days, your health care provider may give you medicines to treat the infection. If you were prescribed an antibiotic medicine, take it as told by your health care provider. Follow all instructions that were given to you by your health care provider. This information is not intended to replace advice given to you by your health care provider. Make sure you discuss any questions you have with your health care provider. Document Revised: 05/03/2020 Document Reviewed: 05/03/2020 Elsevier Patient Education  2023 Elsevier Inc.    If you have been instructed to have an in-person evaluation today at a local Urgent Care facility, please use the link below. It will take you to a list of all of our available [facility], including address, phone number and hours of operation. Please do not delay care.  [facility]  If you or a family member do not have a primary care provider, use the link below to schedule a visit and establish care. When  you choose a [facility] primary care physician or advanced practice provider, you gain a long-term partner in health. Find a Primary Care Provider  Learn more about [facility]'s in-office and virtual care options: [facility] - Get Care Now

## 2022-09-07 ENCOUNTER — Other Ambulatory Visit: Payer: Self-pay | Admitting: Family Medicine

## 2022-09-07 DIAGNOSIS — Z1231 Encounter for screening mammogram for malignant neoplasm of breast: Secondary | ICD-10-CM

## 2022-09-13 ENCOUNTER — Telehealth: Payer: Medicaid Other

## 2022-09-14 ENCOUNTER — Inpatient Hospital Stay: Admission: RE | Admit: 2022-09-14 | Payer: Medicaid Other | Source: Ambulatory Visit

## 2022-09-14 DIAGNOSIS — Z1231 Encounter for screening mammogram for malignant neoplasm of breast: Secondary | ICD-10-CM

## 2022-09-28 ENCOUNTER — Ambulatory Visit: Payer: Medicaid Other
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
## 2022-10-24 ENCOUNTER — Telehealth: Payer: Medicaid Other | Admitting: Physician Assistant

## 2022-10-24 DIAGNOSIS — B9689 Other specified bacterial agents as the cause of diseases classified elsewhere: Secondary | ICD-10-CM | POA: Diagnosis not present

## 2022-10-24 DIAGNOSIS — J019 Acute sinusitis, unspecified: Secondary | ICD-10-CM

## 2022-10-24 MED ORDER — FLUTICASONE PROPIONATE 50 MCG/ACT NA SUSP: 2.0000 | Freq: Every day | NASAL | 0 refills | Status: DC

## 2022-10-24 MED ORDER — AMOXICILLIN-POT CLAVULANATE 875-125 MG PO TABS: 1.0000 | ORAL_TABLET | Freq: Two times a day (BID) | ORAL | 0 refills | Status: DC

## 2022-10-24 NOTE — Patient Instructions (Signed)
[name], thank you for joining [name], PA-C for today's virtual visit.  While this provider is not your primary care provider (PCP), if your PCP is located in our provider database this encounter information will be shared with them immediately following your visit.   A [facility] MyChart account gives you access to today's visit and all your visits, tests, and labs performed at [facility] " click here if you don't have a [facility] MyChart account or go to [email or web address]  Consent: (Patient) [name] provided verbal consent for this virtual visit at the beginning of the encounter.  Current Medications:  Current Outpatient Medications:    amoxicillin-clavulanate (AUGMENTIN) 875-125 MG tablet, Take 1 tablet by mouth 2 (two) times daily., Disp: 20 tablet, Rfl: 0   ARIPiprazole (ABILIFY) 5 MG tablet, Take 5 mg by mouth at bedtime., Disp: , Rfl:    Aspirin-Acetaminophen-Caffeine (GOODY HEADACHE PO), Take 1 Package by mouth as needed (pain, headache). , Disp: , Rfl:    fluticasone (FLONASE) 50 MCG/ACT nasal spray, Place 2 sprays into both nostrils daily., Disp: 16 g, Rfl: 0   naproxen sodium (ALEVE) 220 MG tablet, Take 220 mg by mouth daily as needed (pain)., Disp: , Rfl:    neomycin-polymyxin-hydrocortisone (CORTISPORIN) OTIC solution, Place 3 drops into the right ear 4 (four) times daily. For 7 days, Disp: 10 mL, Rfl: 0   omeprazole (PRILOSEC) 20 MG capsule, Take 1 capsule (20 mg total) by mouth daily., Disp: 30 capsule, Rfl: 0   ondansetron (ZOFRAN ODT) 4 MG disintegrating tablet, Take 1 tablet (4 mg total) by mouth every 4 (four) hours as needed for nausea or vomiting., Disp: 20 tablet, Rfl: 0   SUBOXONE 8-2 MG FILM, Place under the tongue 3 (three) times daily., Disp: , Rfl:    Vilazodone HCl (VIIBRYD) 40 MG TABS, Take 40 mg by mouth every morning., Disp: , Rfl:    VRAYLAR 3 MG capsule, Take 3 mg by mouth daily., Disp: , Rfl:    ZUBSOLV  5.7-1.4 MG SUBL, Place 1 tablet under the tongue 2 (two) times daily., Disp: , Rfl:    Medications ordered in this encounter:  No orders of the defined types were placed in this encounter.    *If you need refills on other medications prior to your next appointment, please contact your pharmacy*  Follow-Up: Call back or seek an in-person evaluation if the symptoms worsen or if the condition fails to improve as anticipated.  [facility] [phone]  Other Instructions Please take antibiotic as directed.  Increase fluid intake.  Use Saline nasal spray.  Take a daily multivitamin. Start the [employer] as directed. Continue your OTC medications.  Place a humidifier in the bedroom.  Please call or return clinic if symptoms are not improving.  Sinusitis Sinusitis is redness, soreness, and swelling (inflammation) of the paranasal sinuses. Paranasal sinuses are air pockets within the bones of your face (beneath the eyes, the middle of the forehead, or above the eyes). In healthy paranasal sinuses, mucus is able to drain out, and air is able to circulate through them by way of your nose. However, when your paranasal sinuses are inflamed, mucus and air can become trapped. This can allow bacteria and other germs to grow and cause infection. Sinusitis can develop quickly and last only a short time (acute) or continue over a long period (chronic). Sinusitis that lasts for more than 12 weeks is considered chronic.  CAUSES  Causes of  sinusitis include: Allergies. Structural abnormalities, such as displacement of the cartilage that separates your nostrils (deviated septum), which can decrease the air flow through your nose and sinuses and affect sinus drainage. Functional abnormalities, such as when the small hairs (cilia) that line your sinuses and help remove mucus do not work properly or are not present. SYMPTOMS  Symptoms of acute and chronic sinusitis are the same. The primary symptoms are  pain and pressure around the affected sinuses. Other symptoms include: Upper toothache. Earache. Headache. Bad breath. Decreased sense of smell and taste. A cough, which worsens when you are lying flat. Fatigue. Fever. Thick drainage from your nose, which often is green and may contain pus (purulent). Swelling and warmth over the affected sinuses. DIAGNOSIS  Your caregiver will perform a physical exam. During the exam, your caregiver may: Look in your nose for signs of abnormal growths in your nostrils (nasal polyps). Tap over the affected sinus to check for signs of infection. View the inside of your sinuses (endoscopy) with a special imaging device with a light attached (endoscope), which is inserted into your sinuses. If your caregiver suspects that you have chronic sinusitis, one or more of the following tests may be recommended: Allergy tests. Nasal culture A sample of mucus is taken from your nose and sent to a lab and screened for bacteria. Nasal cytology A sample of mucus is taken from your nose and examined by your caregiver to determine if your sinusitis is related to an allergy. TREATMENT  Most cases of acute sinusitis are related to a viral infection and will resolve on their own within 10 days. Sometimes medicines are prescribed to help relieve symptoms (pain medicine, decongestants, nasal steroid sprays, or saline sprays).  However, for sinusitis related to a bacterial infection, your caregiver will prescribe antibiotic medicines. These are medicines that will help kill the bacteria causing the infection.  Rarely, sinusitis is caused by a fungal infection. In theses cases, your caregiver will prescribe antifungal medicine. For some cases of chronic sinusitis, surgery is needed. Generally, these are cases in which sinusitis recurs more than 3 times per year, despite other treatments. HOME CARE INSTRUCTIONS  Drink plenty of water. Water helps thin the mucus so your sinuses can  drain more easily. Use a humidifier. Inhale steam 3 to 4 times a day (for example, sit in the bathroom with the shower running). Apply a warm, moist washcloth to your face 3 to 4 times a day, or as directed by your caregiver. Use saline nasal sprays to help moisten and clean your sinuses. Take over-the-counter or prescription medicines for pain, discomfort, or fever only as directed by your caregiver. SEEK IMMEDIATE MEDICAL CARE IF: You have increasing pain or severe headaches. You have nausea, vomiting, or drowsiness. You have swelling around your face. You have vision problems. You have a stiff neck. You have difficulty breathing. MAKE SURE YOU:  Understand these instructions. Will watch your condition. Will get help right away if you are not doing well or get worse. Document Released: 01/23/2005 Document Revised: 04/17/2011 Document Reviewed: 02/07/2011 [facility] Patient Information 2014 [place], [place].    If you have been instructed to have an in-person evaluation today at a local Urgent Care facility, please use the link below. It will take you to a list of all of our available [facility], including address, phone number and hours of operation. Please do not delay care.  [facility]  If you or a family member do  not have a primary care provider, use the link below to schedule a visit and establish care. When you choose a [facility] primary care physician or advanced practice provider, you gain a long-term partner in health. Find a Primary Care Provider  Learn more about [facility]'s in-office and virtual care options: [facility] - Get Care Now

## 2022-10-24 NOTE — Progress Notes (Signed)
Virtual Visit Consent   [name], you are scheduled for a virtual visit with a [facility] provider today. Just as with appointments in the office, your consent must be obtained to participate. Your consent will be active for this visit and any virtual visit you may have with one of our providers in the next 365 days. If you have a MyChart account, a copy of this consent can be sent to you electronically.  As this is a virtual visit, video technology does not allow for your provider to perform a traditional examination. This may limit your provider's ability to fully assess your condition. If your provider identifies any concerns that need to be evaluated in person or the need to arrange testing (such as labs, EKG, etc.), we will make arrangements to do so. Although advances in technology are sophisticated, we cannot ensure that it will always work on either your end or our end. If the connection with a video visit is poor, the visit may have to be switched to a telephone visit. With either a video or telephone visit, we are not always able to ensure that we have a secure connection.  By engaging in this virtual visit, you consent to the provision of healthcare and authorize for your insurance to be billed (if applicable) for the services provided during this visit. Depending on your insurance coverage, you may receive a charge related to this service.  I need to obtain your verbal consent now. Are you willing to proceed with your visit today? [name] has provided verbal consent on 10/24/2022 for a virtual visit (video or telephone). [name], [place]  Date: 10/24/2022 6:06 PM  Virtual Visit via Video Note   I, [name], connected with  [name]  ([id number], January 07, 1976) on 10/24/22 at  6:00 PM EDT by a video-enabled telemedicine application and verified that I am speaking with the correct person using two identifiers.  Location: Patient: Virtual Visit  Location Patient: Home Provider: Virtual Visit Location Provider: Home Office   I discussed the limitations of evaluation and management by telemedicine and the availability of in person appointments. The patient expressed understanding and agreed to proceed.    History of Present Illness: [name] is a 47 y.o. who identifies as a female who was assigned female at birth, and is being seen today for possible sinusitis. Patient endorses symptoms starting 2 weeks ago. Has noted nasal and head congestion, headache, sinus pressure and facial pain. Denies fever, chills.  OTC -- Sudafed, Goody Powders, Neti Pot  HPI: HPI  Problems:  Patient Active Problem List   Diagnosis Date Noted   Paresthesia 01/11/2018   Pelvic pain 01/01/2018   Menorrhagia 01/01/2018   SBO (small bowel obstruction) (HCC) 08/09/2017   Tobacco abuse 08/09/2017   GERD (gastroesophageal reflux disease) 08/09/2017   Hypokalemia 08/09/2017   Leukocytosis 08/09/2017   Dehiscence of fascia s/p C/S 07/25/2017 08/09/2017   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 08/09/2017 08/09/2017   Substance abuse (HCC)    Pregnancy 07/25/2017   Vaginal bleeding in pregnancy, second trimester 04/06/2017   IUGR (intrauterine growth restriction) 02/28/2015   S/P cesarean section 02/28/2015   Radiculopathy 06/04/2014   Low back pain 09/23/2012    Allergies:  Allergies  Allergen Reactions   Chantix [Varenicline] Hives   Doxycycline Itching and Swelling   Medications:  Current Outpatient Medications:    amoxicillin-clavulanate (AUGMENTIN) 875-125 MG tablet, Take 1 tablet by mouth 2 (  two) times daily., Disp: 14 tablet, Rfl: 0   fluticasone (FLONASE) 50 MCG/ACT nasal spray, Place 2 sprays into both nostrils daily., Disp: 16 g, Rfl: 0   Aspirin-Acetaminophen-Caffeine (GOODY HEADACHE PO), Take 1 Package by mouth as needed (pain, headache). , Disp: , Rfl:    naproxen sodium (ALEVE) 220 MG tablet, Take 220 mg by mouth  daily as needed (pain)., Disp: , Rfl:    SUBOXONE 8-2 MG FILM, Place under the tongue 3 (three) times daily., Disp: , Rfl:   Observations/Objective: Patient is well-developed, well-nourished in no acute distress.  Resting comfortably at home.  Head is normocephalic, atraumatic.  No labored breathing.  Speech is clear and coherent with logical content.  Patient is alert and oriented at baseline.   Assessment and Plan: 1. Acute bacterial sinusitis - amoxicillin-clavulanate (AUGMENTIN) 875-125 MG tablet; Take 1 tablet by mouth 2 (two) times daily.  Dispense: 14 tablet; Refill: 0 - fluticasone (FLONASE) 50 MCG/ACT nasal spray; Place 2 sprays into both nostrils daily.  Dispense: 16 g; Refill: 0  Rx Augmentin.  Increase fluids.  Rest.  Saline nasal spray.  Probiotic.  Mucinex as directed.  Humidifier in bedroom. Flonase per orders.  Call or return to clinic if symptoms are not improving.   Follow Up Instructions: I discussed the assessment and treatment plan with the patient. The patient was provided an opportunity to ask questions and all were answered. The patient agreed with the plan and demonstrated an understanding of the instructions.  A copy of instructions were sent to the patient via MyChart unless otherwise noted below.   The patient was advised to call back or seek an in-person evaluation if the symptoms worsen or if the condition fails to improve as anticipated.  Time:  I spent 10 minutes with the patient via telehealth technology discussing the above problems/concerns.    Laura Climes, PA-C

## 2022-11-23 ENCOUNTER — Other Ambulatory Visit: Payer: Self-pay | Admitting: Family Medicine

## 2022-11-23 ENCOUNTER — Telehealth: Payer: Medicaid Other | Admitting: Family Medicine

## 2022-11-23 DIAGNOSIS — Z1231 Encounter for screening mammogram for malignant neoplasm of breast: Secondary | ICD-10-CM

## 2022-11-23 NOTE — Progress Notes (Signed)
The patient no-showed for appointment despite this provider sending direct link, reaching out via phone with no response and waiting for at least 10 minutes from appointment time for patient to join. They will be marked as a NS for this appointment/time.   Freddy Finner, NP

## 2022-12-20 ENCOUNTER — Ambulatory Visit
Admission: RE | Admit: 2022-12-20 | Discharge: 2022-12-20 | Disposition: A | Discharge status: Home / Self Care | Payer: Medicaid Other | Source: Ambulatory Visit

## 2022-12-20 DIAGNOSIS — Z1231 Encounter for screening mammogram for malignant neoplasm of breast: Secondary | ICD-10-CM

## 2022-12-25 ENCOUNTER — Other Ambulatory Visit: Payer: Self-pay | Admitting: Family Medicine

## 2022-12-25 DIAGNOSIS — R928 Other abnormal and inconclusive findings on diagnostic imaging of breast: Secondary | ICD-10-CM

## 2022-12-30 ENCOUNTER — Encounter (HOSPITAL_COMMUNITY): Payer: Self-pay | Admitting: *Deleted

## 2022-12-30 ENCOUNTER — Emergency Department (HOSPITAL_COMMUNITY)
Admission: EM | Admit: 2022-12-30 | Discharge: 2022-12-31 | Disposition: A | Discharge status: Home / Self Care | Payer: Medicaid Other | Attending: Emergency Medicine | Admitting: Emergency Medicine

## 2022-12-30 ENCOUNTER — Other Ambulatory Visit: Payer: Self-pay

## 2022-12-30 DIAGNOSIS — N8110 Cystocele, unspecified: Secondary | ICD-10-CM | POA: Diagnosis not present

## 2022-12-30 DIAGNOSIS — R3982 Chronic bladder pain: Secondary | ICD-10-CM | POA: Diagnosis present

## 2022-12-30 LAB — URINALYSIS, ROUTINE W REFLEX MICROSCOPIC
Bacteria, UA: NONE SEEN
Bilirubin Urine: NEGATIVE
Glucose, UA: NEGATIVE mg/dL
Hgb urine dipstick: NEGATIVE
Ketones, ur: NEGATIVE mg/dL
Nitrite: NEGATIVE
Protein, ur: NEGATIVE mg/dL
Specific Gravity, Urine: 1.012 (ref 1.005–1.030)
pH: 5 (ref 5.0–8.0)

## 2022-12-30 LAB — CBC
HCT: 42.8 % (ref 36.0–46.0)
Hemoglobin: 14.4 g/dL (ref 12.0–15.0)
MCH: 31 pg (ref 26.0–34.0)
MCHC: 33.6 g/dL (ref 30.0–36.0)
MCV: 92 fL (ref 80.0–100.0)
Platelets: 265 10*3/uL (ref 150–400)
RBC: 4.65 MIL/uL (ref 3.87–5.11)
RDW: 13.1 % (ref 11.5–15.5)
WBC: 16.5 10*3/uL — ABNORMAL HIGH (ref 4.0–10.5)
nRBC: 0 % (ref 0.0–0.2)

## 2022-12-30 LAB — COMPREHENSIVE METABOLIC PANEL
ALT: 21 U/L (ref 0–44)
AST: 22 U/L (ref 15–41)
Albumin: 3.6 g/dL (ref 3.5–5.0)
Alkaline Phosphatase: 66 U/L (ref 38–126)
Anion gap: 8 (ref 5–15)
BUN: 15 mg/dL (ref 6–20)
CO2: 26 mmol/L (ref 22–32)
Calcium: 8.9 mg/dL (ref 8.9–10.3)
Chloride: 102 mmol/L (ref 98–111)
Creatinine, Ser: 0.68 mg/dL (ref 0.44–1.00)
GFR, Estimated: >60 mL/min (ref >60)
Glucose, Bld: 109 mg/dL — ABNORMAL HIGH (ref 70–99)
Potassium: 3.9 mmol/L (ref 3.5–5.1)
Sodium: 136 mmol/L (ref 135–145)
Total Bilirubin: 0.4 mg/dL (ref <1.2)
Total Protein: 6.3 g/dL — ABNORMAL LOW (ref 6.5–8.1)

## 2022-12-30 LAB — LIPASE, BLOOD: Lipase: 31 U/L (ref 11–51)

## 2022-12-30 NOTE — ED Triage Notes (Signed)
The pt thinks that her bladder is falling out  for 2 months  she  has been seen by an ob doctor that reported that she had normal anatomy lmp 2018

## 2022-12-31 NOTE — ED Provider Notes (Signed)
[place] EMERGENCY DEPARTMENT AT [facility] Provider Note  CSN: [id number] Arrival date & time: 12/30/22 2214  Chief Complaint(s) No chief complaint on file.  HPI [name] is a 47 y.o. female with a past medical history listed below including pelvic floor insufficiency and known cystocele who presents to the emergency department with bladder cramping.  Patient reports that she feels like her insides are falling out of her.  This has been an ongoing issue for the past year.  She has been to pelvic floor physical therapy earlier in the year.  Over the past several weeks she has noted that she has had bulging coming out more frequently requiring her to reinsert multiple times per day.  She finds it difficult to void at times because of this.  Denies any other physical complaints.  HPI  Past Medical History Past Medical History:  Diagnosis Date   AMA (advanced maternal age) multigravida 35+    Arthritis    Chronic back pain    Depression    no meds    Hx of eating disorder    Numbness    right leg   Substance abuse (HCC)    since 2009   Patient Active Problem List   Diagnosis Date Noted   Paresthesia 01/11/2018   Pelvic pain 01/01/2018   Menorrhagia 01/01/2018   SBO (small bowel obstruction) (HCC) 08/09/2017   Tobacco abuse 08/09/2017   GERD (gastroesophageal reflux disease) 08/09/2017   Hypokalemia 08/09/2017   Leukocytosis 08/09/2017   Dehiscence of fascia s/p C/S 07/25/2017 08/09/2017   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 08/09/2017 08/09/2017   Substance abuse (HCC)    Pregnancy 07/25/2017   Vaginal bleeding in pregnancy, second trimester 04/06/2017   IUGR (intrauterine growth restriction) 02/28/2015   S/P cesarean section 02/28/2015   Radiculopathy 06/04/2014   Low back pain 09/23/2012   Home Medication(s) Prior to Admission medications   Medication Sig Start Date End Date Taking? Authorizing Provider  amoxicillin-clavulanate  (AUGMENTIN) 875-125 MG tablet Take 1 tablet by mouth 2 (two) times daily. 10/24/22   Waldon Merl, PA-C  Aspirin-Acetaminophen-Caffeine (GOODY HEADACHE PO) Take 1 Package by mouth as needed (pain, headache).     [provider]  fluticasone (FLONASE) 50 MCG/ACT nasal spray Place 2 sprays into both nostrils daily. 10/24/22   Waldon Merl, PA-C  naproxen sodium (ALEVE) 220 MG tablet Take 220 mg by mouth daily as needed (pain).    [provider]  SUBOXONE 8-2 MG FILM Place under the tongue 3 (three) times daily. 12/08/20   [provider]                                                                                                                                    Allergies Chantix [varenicline] and Doxycycline  Review of Systems Review of Systems As noted in HPI  Physical Exam Vital Signs  I have reviewed the triage vital signs BP 104/79 (BP Location: Right Arm)   Pulse 75   Temp 98.1 F (36.7 C) (Oral)   Resp 17   Ht 5\' 4"  (1.626 m)   Wt 58.5 kg   LMP 12/05/2017 Comment: continues to bleed since having baby  SpO2 98%   BMI 22.14 kg/m   Physical Exam Vitals reviewed. Exam conducted with a chaperone present.  Constitutional:      General: She is not in acute distress.    Appearance: She is well-developed. She is not diaphoretic.  HENT:     Head: Normocephalic and atraumatic.     Right Ear: External ear normal.     Left Ear: External ear normal.     Nose: Nose normal.  Eyes:     General: No scleral icterus.    Conjunctiva/sclera: Conjunctivae normal.  Neck:     Trachea: Phonation normal.  Cardiovascular:     Rate and Rhythm: Normal rate and regular rhythm.  Pulmonary:     Effort: Pulmonary effort is normal. No respiratory distress.     Breath sounds: No stridor.  Abdominal:     General: There is no distension.     Tenderness: There is no abdominal tenderness. There is no guarding or rebound.     Hernia: No hernia is present.   Genitourinary:    Vagina: Prolapsed vaginal walls (small prolapse noted worsened with bearing down. exposed vaginal wall is dry, but not erythematous and w/o bleeding or necrotic tissue) present.  Musculoskeletal:        General: Normal range of motion.     Cervical back: Normal range of motion.  Neurological:     Mental Status: She is alert and oriented to person, place, and time.  Psychiatric:        Behavior: Behavior normal.     ED Results and Treatments Labs (all labs ordered are listed, but only abnormal results are displayed) Labs Reviewed  COMPREHENSIVE METABOLIC PANEL - Abnormal; Notable for the following components:      Result Value   Glucose, Bld 109 (*)    Total Protein 6.3 (*)    All other components within normal limits  CBC - Abnormal; Notable for the following components:   WBC 16.5 (*)    All other components within normal limits  URINALYSIS, ROUTINE W REFLEX MICROSCOPIC - Abnormal; Notable for the following components:   APPearance HAZY (*)    Leukocytes,Ua TRACE (*)    All other components within normal limits  LIPASE, BLOOD                                                                                                                         EKG  EKG Interpretation Date/Time:    Ventricular Rate:    PR Interval:    QRS Duration:    QT Interval:    QTC Calculation:   R Axis:      Text Interpretation:  Radiology No results found.  Medications Ordered in ED Medications - No data to display Procedures Procedures  (including critical care time) Medical Decision Making / ED Course   Medical Decision Making   Bladder spasms related to bladder prolapse from pelvic floor insufficiency.    DDX and work up below:  No obvious evidence of necrotic or irritated tissue on exam.  Abdomen is benign -low suspicion for serious intra-abdominal [place]/infectious process requiring imaging at this time. Labs with leukocytosis of unknown etiology  but patient has chronic leukocytosis on previous CBCs.  No anemia.  CMP without significant electrolyte derangements or renal sufficiency.  No bili obstruction or pancreatitis.  UA without evidence of infection.  Recommended moisturizing ointment for the vaginal wall and continue pelvic floor exercises..  Instructed to follow-up with her pelvic floor specialist which she has an upcoming appointment for to discuss additional options.    Final Clinical Impression(s) / ED Diagnoses Final diagnoses:  Bladder prolapse, female, acquired   The patient appears reasonably screened and/or stabilized for discharge and I doubt any other medical condition or other [facility] requiring further screening, evaluation, or treatment in the ED at this time. I have discussed the findings, Dx and Tx plan with the patient/family who expressed understanding and agree(s) with the plan. Discharge instructions discussed at length. The patient/family was given strict return precautions who verbalized understanding of the instructions. No further questions at time of discharge.  Disposition: Discharge  Condition: Good  ED Discharge Orders     None         Follow Up: Avanell Shackleton, NP-C [address] [place] [address] [phone]     Andris Baumann [address] [place] [address] [phone]       This chart was dictated using voice recognition software.  Despite best efforts to proofread,  errors can occur which can change the documentation meaning.    Nira Conn, MD 12/31/22 [phone]

## 2023-01-13 ENCOUNTER — Inpatient Hospital Stay: Admission: RE | Admit: 2023-01-13 | Payer: Medicaid Other | Source: Ambulatory Visit

## 2023-01-13 ENCOUNTER — Other Ambulatory Visit: Payer: Medicaid Other

## 2023-02-26 ENCOUNTER — Telehealth: Payer: Medicaid Other | Admitting: Physician Assistant

## 2023-02-26 DIAGNOSIS — J019 Acute sinusitis, unspecified: Secondary | ICD-10-CM | POA: Diagnosis not present

## 2023-02-26 DIAGNOSIS — H9201 Otalgia, right ear: Secondary | ICD-10-CM

## 2023-02-26 DIAGNOSIS — B9689 Other specified bacterial agents as the cause of diseases classified elsewhere: Secondary | ICD-10-CM | POA: Diagnosis not present

## 2023-02-26 MED ORDER — FLUTICASONE PROPIONATE 50 MCG/ACT NA SUSP: 2.0000 | Freq: Every day | NASAL | 0 refills | Status: DC

## 2023-02-26 NOTE — Progress Notes (Signed)
E-Visit for Ear Pain - Eustachian Tube Dysfunction   We are sorry that you are not feeling well. Here is how we plan to help!  Based on what you have shared with me it looks like you have Eustachian Tube Dysfunction.  Eustachian Tube Dysfunction is a condition where the tubes that connect your middle ears to your upper throat become blocked. This can lead to discomfort, hearing difficulties and a feeling of fullness in your ear. Eustachian tube dysfunction usually resolves itself in a few days. The usual symptoms include: Hearing problems Tinnitus, or ringing in your ears Clicking or popping sounds A feeling of fullness in your ears Pain that mimics an ear infection Dizziness, vertigo or balance problems A "tickling" sensation in your ears  ?Eustachian tube dysfunction symptoms may get worse in higher altitudes. This is called barotrauma, and it can happen while scuba diving, flying in an airplane or driving in the mountains.   What causes eustachian tube dysfunction? Allergies and infections (like the common cold and the flu) are the most common causes of eustachian tube dysfunction. These conditions can cause inflammation and mucus buildup, leading to blockage. GERD, or chronic acid reflux, can also cause ETD. This is because stomach acid can back up into your throat and result in inflammation. As mentioned above, altitude changes can also cause ETD.   What are some common eustachian tube dysfunction treatments? In most cases, treatment isn't necessary because ETD often resolves on its own. However, you might need treatment if your symptoms linger for more than two weeks.    Eustachian tube dysfunction treatment depends on the cause and the severity of your condition. Treatments may include home remedies, medications or, in severe cases, surgery.     HOME CARE: Sometimes simple home remedies can help with mild cases of eustachian tube dysfunction. To try and clear the blockage, you  can: Chew gum. Yawn. Swallow. Try the Valsalva maneuver (breathing out forcefully while closing your mouth and pinching your nostrils). Use a saline spray to clear out nasal passages.  MEDICATIONS: Over-the-counter medications can help if allergies are causing eustachian tube dysfunction. Try antihistamines (like cetirizine or diphenhydramine) to ease your symptoms. If you have discomfort, pain relievers -- such as acetaminophen or ibuprofen -- can help.  Sometimes intranasal glucocorticosteroids (like Flonase or Nasacort) help.  I have prescribed Fluticasone 50 mcg/spray 2 sprays in each nostril daily for 10-14 days    GET HELP RIGHT AWAY IF: Fever is over 102.2 degrees. You develop progressive ear pain or hearing loss. Ear symptoms persist longer than 3 days after treatment.  MAKE SURE YOU: Understand these instructions. Will watch your condition. Will get help right away if you are not doing well or get worse.  Thank you for choosing an e-visit.  Your e-visit answers were reviewed by a board certified advanced clinical practitioner to complete your personal care plan. Depending upon the condition, your plan could have included both over the counter or prescription medications.  Please review your pharmacy choice. Make sure the pharmacy is open so you can pick up the prescription now. If there is a problem, you may contact your provider through MyChart messaging and have the prescription routed to another pharmacy.  Your safety is important to us. If you have drug allergies check your prescription carefully.   For the next 24 hours you can use MyChart to ask questions about today's visit, request a non-urgent call back, or ask for a work or school excuse. You will   get an email with a survey after your eVisit asking about your experience. We would appreciate your feedback. I hope that your e-visit has been valuable and will aid in your recovery.    I have spent 5 minutes in review  of e-visit questionnaire, review and updating patient chart, medical decision making and response to patient.   Loraine Grip Mayers, PA-C

## 2023-03-01 ENCOUNTER — Telehealth: Payer: Medicaid Other | Admitting: Nurse Practitioner

## 2023-03-01 DIAGNOSIS — J0100 Acute maxillary sinusitis, unspecified: Secondary | ICD-10-CM | POA: Diagnosis not present

## 2023-03-01 MED ORDER — AMOXICILLIN-POT CLAVULANATE 875-125 MG PO TABS
1.0000 | ORAL_TABLET | Freq: Two times a day (BID) | ORAL | 0 refills | Status: AC
Start: 2023-03-01 — End: 2023-03-08

## 2023-03-01 MED ORDER — PREDNISONE 20 MG PO TABS: 20.0000 mg | ORAL_TABLET | Freq: Every day | ORAL | 0 refills | Status: AC

## 2023-03-01 NOTE — Progress Notes (Signed)
Virtual Visit Consent   [name], you are scheduled for a virtual visit with a [facility] provider today. Just as with appointments in the office, your consent must be obtained to participate. Your consent will be active for this visit and any virtual visit you may have with one of our providers in the next 365 days. If you have a MyChart account, a copy of this consent can be sent to you electronically.  As this is a virtual visit, video technology does not allow for your provider to perform a traditional examination. This may limit your provider's ability to fully assess your condition. If your provider identifies any concerns that need to be evaluated in person or the need to arrange testing (such as labs, EKG, etc.), we will make arrangements to do so. Although advances in technology are sophisticated, we cannot ensure that it will always work on either your end or our end. If the connection with a video visit is poor, the visit may have to be switched to a telephone visit. With either a video or telephone visit, we are not always able to ensure that we have a secure connection.  By engaging in this virtual visit, you consent to the provision of healthcare and authorize for your insurance to be billed (if applicable) for the services provided during this visit. Depending on your insurance coverage, you may receive a charge related to this service.  I need to obtain your verbal consent now. Are you willing to proceed with your visit today? Katti A Eide has provided verbal consent on 03/01/2023 for a virtual visit (video or telephone). Viviano Simas, FNP  Date: 03/01/2023 6:03 PM  Virtual Visit via Video Note   I, Viviano Simas, connected with  [name]  ([id number], 1975/10/29) on 03/01/23 at  6:00 PM EST by a video-enabled telemedicine application and verified that I am speaking with the correct person using two identifiers.  Location: Patient: Virtual Visit Location Patient:  Home Provider: Virtual Visit Location Provider: Home Office   I discussed the limitations of evaluation and management by telemedicine and the availability of in person appointments. The patient expressed understanding and agreed to proceed.    History of Present Illness: [name] is a 48 y.o. who identifies as a female who was assigned female at birth, and is being seen today for sinus congestion that is causing teeth pain with ongoing ear pain as well   She had an Evisit 3 days ago and was given Flonase for relief that does not seem to be helping   She did have a fever on Monday that has resolved She has been using goodies, sudafed and tylenol cold and flu without relief   Denies any chest congestion does have a slight cough    Problems:  Patient Active Problem List   Diagnosis Date Noted   Paresthesia 01/11/2018   Pelvic pain 01/01/2018   Menorrhagia 01/01/2018   SBO (small bowel obstruction) (HCC) 08/09/2017   Tobacco abuse 08/09/2017   GERD (gastroesophageal reflux disease) 08/09/2017   Hypokalemia 08/09/2017   Leukocytosis 08/09/2017   Dehiscence of fascia s/p C/S 07/25/2017 08/09/2017   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 08/09/2017 08/09/2017   Substance abuse (HCC)    Pregnancy 07/25/2017   Vaginal bleeding in pregnancy, second trimester 04/06/2017   IUGR (intrauterine growth restriction) 02/28/2015   S/P cesarean section 02/28/2015   Radiculopathy 06/04/2014   Low back pain 09/23/2012    Allergies:  Allergies  Allergen Reactions   Chantix [Varenicline] Hives   Doxycycline Itching and Swelling   Medications:  Current Outpatient Medications:    amoxicillin-clavulanate (AUGMENTIN) 875-125 MG tablet, Take 1 tablet by mouth 2 (two) times daily., Disp: 14 tablet, Rfl: 0   Aspirin-Acetaminophen-Caffeine (GOODY HEADACHE PO), Take 1 Package by mouth as needed (pain, headache). , Disp: , Rfl:    fluticasone (FLONASE) 50 MCG/ACT nasal spray,  Place 2 sprays into both nostrils daily., Disp: 16 g, Rfl: 0   naproxen sodium (ALEVE) 220 MG tablet, Take 220 mg by mouth daily as needed (pain)., Disp: , Rfl:    SUBOXONE 8-2 MG FILM, Place under the tongue 3 (three) times daily., Disp: , Rfl:   Observations/Objective: Patient is well-developed, well-nourished in no acute distress.  Resting comfortably  at home.  Head is normocephalic, atraumatic.  No labored breathing.  Speech is clear and coherent with logical content.  Patient is alert and oriented at baseline.    Assessment and Plan:  1. Acute non-recurrent maxillary sinusitis (Primary) Take both with food: - amoxicillin-clavulanate (AUGMENTIN) 875-125 MG tablet; Take 1 tablet by mouth 2 (two) times daily for 7 days.  Dispense: 14 tablet; Refill: 0  - predniSONE (DELTASONE) 20 MG tablet; Take 1 tablet (20 mg total) by mouth daily with breakfast for 5 days.  Dispense: 5 tablet; Refill: 0    Continue flonase as well   Follow Up Instructions: I discussed the assessment and treatment plan with the patient. The patient was provided an opportunity to ask questions and all were answered. The patient agreed with the plan and demonstrated an understanding of the instructions.  A copy of instructions were sent to the patient via MyChart unless otherwise noted below.    The patient was advised to call back or seek an in-person evaluation if the symptoms worsen or if the condition fails to improve as anticipated.    Viviano Simas, FNP

## 2023-03-13 ENCOUNTER — Ambulatory Visit
Admission: RE | Admit: 2023-03-13 | Discharge: 2023-03-13 | Disposition: A | Discharge status: Home / Self Care | Payer: Medicaid Other | Source: Ambulatory Visit | Attending: Family Medicine | Admitting: Family Medicine

## 2023-03-13 DIAGNOSIS — R928 Other abnormal and inconclusive findings on diagnostic imaging of breast: Secondary | ICD-10-CM

## 2023-03-14 ENCOUNTER — Other Ambulatory Visit: Payer: Self-pay | Admitting: Orthopedic Surgery

## 2023-03-14 DIAGNOSIS — M545 Low back pain, unspecified: Secondary | ICD-10-CM

## 2023-03-15 ENCOUNTER — Encounter: Payer: Self-pay | Admitting: Orthopedic Surgery

## 2023-03-19 ENCOUNTER — Other Ambulatory Visit: Payer: Medicaid Other

## 2023-03-23 ENCOUNTER — Other Ambulatory Visit: Payer: Medicaid Other

## 2023-04-12 ENCOUNTER — Telehealth

## 2023-04-12 DIAGNOSIS — J019 Acute sinusitis, unspecified: Secondary | ICD-10-CM

## 2023-04-12 DIAGNOSIS — B9689 Other specified bacterial agents as the cause of diseases classified elsewhere: Secondary | ICD-10-CM | POA: Diagnosis not present

## 2023-04-12 MED ORDER — AMOXICILLIN-POT CLAVULANATE 875-125 MG PO TABS: 1.0000 | ORAL_TABLET | Freq: Two times a day (BID) | ORAL | 0 refills | Status: AC

## 2023-04-12 MED ORDER — IPRATROPIUM BROMIDE 0.03 % NA SOLN: 2.0000 | Freq: Two times a day (BID) | NASAL | 0 refills | Status: AC

## 2023-04-12 NOTE — Patient Instructions (Signed)
 Lalah A Winokur, thank you for joining [name], PA-C for today's virtual visit.  While this provider is not your primary care provider (PCP), if your PCP is located in our provider database this encounter information will be shared with them immediately following your visit.   A [facility] MyChart account gives you access to today's visit and all your visits, tests, and labs performed at [facility] " click here if you don't have a [facility] MyChart account or go to [email or web address]  Consent: (Patient) [name] provided verbal consent for this virtual visit at the beginning of the encounter.  Current Medications:  Current Outpatient Medications:    Aspirin-Acetaminophen-Caffeine (GOODY HEADACHE PO), Take 1 Package by mouth as needed (pain, headache). , Disp: , Rfl:    fluticasone (FLONASE) 50 MCG/ACT nasal spray, Place 2 sprays into both nostrils daily., Disp: 16 g, Rfl: 0   SUBOXONE 8-2 MG FILM, Place under the tongue 3 (three) times daily., Disp: , Rfl:    Medications ordered in this encounter:  No orders of the defined types were placed in this encounter.    *If you need refills on other medications prior to your next appointment, please contact your pharmacy*  Follow-Up: Call back or seek an in-person evaluation if the symptoms worsen or if the condition fails to improve as anticipated.  [facility] [phone]  Other Instructions Please take antibiotic as directed.  Increase fluid intake.  Use Saline nasal spray.  Take a daily multivitamin. Use the Atrovent spray as directed. Ok to continue your OTC medications.  Place a humidifier in the bedroom.  Please call or return clinic if symptoms are not improving.  Sinusitis Sinusitis is redness, soreness, and swelling (inflammation) of the paranasal sinuses. Paranasal sinuses are air pockets within the bones of your face (beneath the eyes, the middle of the forehead, or above  the eyes). In healthy paranasal sinuses, mucus is able to drain out, and air is able to circulate through them by way of your nose. However, when your paranasal sinuses are inflamed, mucus and air can become trapped. This can allow bacteria and other germs to grow and cause infection. Sinusitis can develop quickly and last only a short time (acute) or continue over a long period (chronic). Sinusitis that lasts for more than 12 weeks is considered chronic.  CAUSES  Causes of sinusitis include: Allergies. Structural abnormalities, such as displacement of the cartilage that separates your nostrils (deviated septum), which can decrease the air flow through your nose and sinuses and affect sinus drainage. Functional abnormalities, such as when the small hairs (cilia) that line your sinuses and help remove mucus do not work properly or are not present. SYMPTOMS  Symptoms of acute and chronic sinusitis are the same. The primary symptoms are pain and pressure around the affected sinuses. Other symptoms include: Upper toothache. Earache. Headache. Bad breath. Decreased sense of smell and taste. A cough, which worsens when you are lying flat. Fatigue. Fever. Thick drainage from your nose, which often is green and may contain pus (purulent). Swelling and warmth over the affected sinuses. DIAGNOSIS  Your caregiver will perform a physical exam. During the exam, your caregiver may: Look in your nose for signs of abnormal growths in your nostrils (nasal polyps). Tap over the affected sinus to check for signs of infection. View the inside of your sinuses (endoscopy) with a special imaging device with a light attached (endoscope), which is inserted into your sinuses. If  your caregiver suspects that you have chronic sinusitis, one or more of the following tests may be recommended: Allergy tests. Nasal culture A sample of mucus is taken from your nose and sent to a lab and screened for bacteria. Nasal  cytology A sample of mucus is taken from your nose and examined by your caregiver to determine if your sinusitis is related to an allergy. TREATMENT  Most cases of acute sinusitis are related to a viral infection and will resolve on their own within 10 days. Sometimes medicines are prescribed to help relieve symptoms (pain medicine, decongestants, nasal steroid sprays, or saline sprays).  However, for sinusitis related to a bacterial infection, your caregiver will prescribe antibiotic medicines. These are medicines that will help kill the bacteria causing the infection.  Rarely, sinusitis is caused by a fungal infection. In theses cases, your caregiver will prescribe antifungal medicine. For some cases of chronic sinusitis, surgery is needed. Generally, these are cases in which sinusitis recurs more than 3 times per year, despite other treatments. HOME CARE INSTRUCTIONS  Drink plenty of water. Water helps thin the mucus so your sinuses can drain more easily. Use a humidifier. Inhale steam 3 to 4 times a day (for example, sit in the bathroom with the shower running). Apply a warm, moist washcloth to your face 3 to 4 times a day, or as directed by your caregiver. Use saline nasal sprays to help moisten and clean your sinuses. Take over-the-counter or prescription medicines for pain, discomfort, or fever only as directed by your caregiver. SEEK IMMEDIATE MEDICAL CARE IF: You have increasing pain or severe headaches. You have nausea, vomiting, or drowsiness. You have swelling around your face. You have vision problems. You have a stiff neck. You have difficulty breathing. MAKE SURE YOU:  Understand these instructions. Will watch your condition. Will get help right away if you are not doing well or get worse. Document Released: 01/23/2005 Document Revised: 04/17/2011 Document Reviewed: 02/07/2011 [facility] Patient Information 2014 [place], [place].    If you have been instructed to have an  in-person evaluation today at a local Urgent Care facility, please use the link below. It will take you to a list of all of our available [facility], including address, phone number and hours of operation. Please do not delay care.  [facility]  If you or a family member do not have a primary care provider, use the link below to schedule a visit and establish care. When you choose a [facility] primary care physician or advanced practice provider, you gain a long-term partner in health. Find a Primary Care Provider  Learn more about [facility]'s in-office and virtual care options: [facility] - Get Care Now

## 2023-04-12 NOTE — Progress Notes (Signed)
 Virtual Visit Consent   [name], you are scheduled for a virtual visit with a [facility] provider today. Just as with appointments in the office, your consent must be obtained to participate. Your consent will be active for this visit and any virtual visit you may have with one of our providers in the next 365 days. If you have a MyChart account, a copy of this consent can be sent to you electronically.  As this is a virtual visit, video technology does not allow for your provider to perform a traditional examination. This may limit your provider's ability to fully assess your condition. If your provider identifies any concerns that need to be evaluated in person or the need to arrange testing (such as labs, EKG, etc.), we will make arrangements to do so. Although advances in technology are sophisticated, we cannot ensure that it will always work on either your end or our end. If the connection with a video visit is poor, the visit may have to be switched to a telephone visit. With either a video or telephone visit, we are not always able to ensure that we have a secure connection.  By engaging in this virtual visit, you consent to the provision of healthcare and authorize for your insurance to be billed (if applicable) for the services provided during this visit. Depending on your insurance coverage, you may receive a charge related to this service.  I need to obtain your verbal consent now. Are you willing to proceed with your visit today? [name] has provided verbal consent on 04/12/2023 for a virtual visit (video or telephone). [name], [place]  Date: 04/12/2023 8:07 AM   Virtual Visit via Video Note   I, [name], connected with  [name]  ([id number], 07-27-1975) on 04/12/23 at  8:00 AM EST by a video-enabled telemedicine application and verified that I am speaking with the correct person using two identifiers.  Location: Patient: Virtual Visit  Location Patient: Home Provider: Virtual Visit Location Provider: Home Office   I discussed the limitations of evaluation and management by telemedicine and the availability of in person appointments. The patient expressed understanding and agreed to proceed.    History of Present Illness: [name] is a 48 y.o. who identifies as a female who was assigned female at birth, and is being seen today for 5-6 days of sinus headache with pressure, now with 3 days of facial and upper tooth pain with very thick nasal drainage. Denies fever, chills. Denies chest congestion or cough.   Is using the [facility] with minimal relief. Has taken an OTC COVID test that was negative.   OTC -- Sudafed, Advil, Mucinex  HPI: HPI  Problems:  Patient Active Problem List   Diagnosis Date Noted   Paresthesia 01/11/2018   Pelvic pain 01/01/2018   Menorrhagia 01/01/2018   SBO (small bowel obstruction) (HCC) 08/09/2017   Tobacco abuse 08/09/2017   GERD (gastroesophageal reflux disease) 08/09/2017   Hypokalemia 08/09/2017   Leukocytosis 08/09/2017   Dehiscence of fascia s/p C/S 07/25/2017 08/09/2017   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 08/09/2017 08/09/2017   Substance abuse (HCC)    Pregnancy 07/25/2017   Vaginal bleeding in pregnancy, second trimester 04/06/2017   IUGR (intrauterine growth restriction) 02/28/2015   S/P cesarean section 02/28/2015   Radiculopathy 06/04/2014   Low back pain 09/23/2012    Allergies:  Allergies  Allergen Reactions   Chantix [Varenicline] Hives  Doxycycline Itching and Swelling   Medications:  Current Outpatient Medications:    amoxicillin-clavulanate (AUGMENTIN) 875-125 MG tablet, Take 1 tablet by mouth 2 (two) times daily., Disp: 14 tablet, Rfl: 0   ipratropium (ATROVENT) 0.03 % nasal spray, Place 2 sprays into both nostrils every 12 (twelve) hours., Disp: 30 mL, Rfl: 0   Aspirin-Acetaminophen-Caffeine (GOODY HEADACHE PO), Take 1 Package by mouth  as needed (pain, headache). , Disp: , Rfl:    SUBOXONE 8-2 MG FILM, Place under the tongue 3 (three) times daily., Disp: , Rfl:   Observations/Objective: Patient is well-developed, well-nourished in no acute distress.  Resting comfortably at home.  Head is normocephalic, atraumatic.  No labored breathing. Speech is clear and coherent with logical content.  Patient is alert and oriented at baseline.   Assessment and Plan: 1. Acute bacterial sinusitis (Primary) - amoxicillin-clavulanate (AUGMENTIN) 875-125 MG tablet; Take 1 tablet by mouth 2 (two) times daily.  Dispense: 14 tablet; Refill: 0 - ipratropium (ATROVENT) 0.03 % nasal spray; Place 2 sprays into both nostrils every 12 (twelve) hours.  Dispense: 30 mL; Refill: 0  Rx Augmentin.  Increase fluids.  Rest.  Saline nasal spray.  Probiotic.  Mucinex as directed.  Humidifier in bedroom. Atrovent nasal spray per orders.  Call or return to clinic if symptoms are not improving.   Follow Up Instructions: I discussed the assessment and treatment plan with the patient. The patient was provided an opportunity to ask questions and all were answered. The patient agreed with the plan and demonstrated an understanding of the instructions.  A copy of instructions were sent to the patient via MyChart unless otherwise noted below.   The patient was advised to call back or seek an in-person evaluation if the symptoms worsen or if the condition fails to improve as anticipated.    [name], PA-C
